# Patient Record
Sex: Female | Born: 1940 | Race: White | Hispanic: No | Marital: Married | Smoking: Former smoker
Health system: Southern US, Community
[De-identification: ages and names within clinical notes are randomized; demographics above are authoritative.]

## PROBLEM LIST (undated history)

## (undated) ENCOUNTER — Emergency Department: Payer: Medicare HMO

## (undated) ENCOUNTER — Ambulatory Visit: Admission: EM | Payer: PPO | Source: Home / Self Care

## (undated) ENCOUNTER — Emergency Department (HOSPITAL_COMMUNITY): Payer: PPO

## (undated) DIAGNOSIS — N189 Chronic kidney disease, unspecified: Secondary | ICD-10-CM

## (undated) DIAGNOSIS — F0150 Vascular dementia without behavioral disturbance: Secondary | ICD-10-CM

## (undated) HISTORY — PX: TONSILLECTOMY: SUR1361

## (undated) HISTORY — PX: CHOLECYSTECTOMY: SHX55

## (undated) HISTORY — PX: LUMBAR PERITONEAL SHUNT: SHX1984

## (undated) HISTORY — PX: OTHER SURGICAL HISTORY: SHX169

## (undated) HISTORY — PX: KIDNEY TRANSPLANT: SHX239

---

## 1999-01-18 ENCOUNTER — Other Ambulatory Visit: Admission: RE | Admit: 1999-01-18 | Discharge: 1999-01-18 | Payer: Self-pay | Admitting: *Deleted

## 2000-02-02 ENCOUNTER — Other Ambulatory Visit: Admission: RE | Admit: 2000-02-02 | Discharge: 2000-02-02 | Payer: Self-pay | Admitting: *Deleted

## 2001-03-01 ENCOUNTER — Other Ambulatory Visit: Admission: RE | Admit: 2001-03-01 | Discharge: 2001-03-01 | Payer: Self-pay | Admitting: *Deleted

## 2001-03-22 ENCOUNTER — Ambulatory Visit (HOSPITAL_COMMUNITY): Admission: RE | Admit: 2001-03-22 | Discharge: 2001-03-22 | Payer: Self-pay | Admitting: Internal Medicine

## 2002-03-04 ENCOUNTER — Other Ambulatory Visit: Admission: RE | Admit: 2002-03-04 | Discharge: 2002-03-04 | Payer: Self-pay | Admitting: Obstetrics & Gynecology

## 2003-03-06 ENCOUNTER — Other Ambulatory Visit: Admission: RE | Admit: 2003-03-06 | Discharge: 2003-03-06 | Payer: Self-pay | Admitting: *Deleted

## 2004-03-10 ENCOUNTER — Other Ambulatory Visit: Admission: RE | Admit: 2004-03-10 | Discharge: 2004-03-10 | Payer: Self-pay | Admitting: *Deleted

## 2007-09-03 ENCOUNTER — Ambulatory Visit: Payer: Self-pay | Admitting: Internal Medicine

## 2007-09-03 ENCOUNTER — Encounter: Payer: Self-pay | Admitting: Internal Medicine

## 2007-09-03 ENCOUNTER — Ambulatory Visit (HOSPITAL_COMMUNITY): Admission: RE | Admit: 2007-09-03 | Discharge: 2007-09-03 | Payer: Self-pay | Admitting: Internal Medicine

## 2007-11-06 ENCOUNTER — Ambulatory Visit (HOSPITAL_COMMUNITY): Admission: RE | Admit: 2007-11-06 | Discharge: 2007-11-06 | Payer: Self-pay | Admitting: Internal Medicine

## 2009-02-02 ENCOUNTER — Ambulatory Visit (HOSPITAL_COMMUNITY): Admission: RE | Admit: 2009-02-02 | Discharge: 2009-02-02 | Payer: Self-pay | Admitting: Internal Medicine

## 2009-08-17 ENCOUNTER — Encounter (HOSPITAL_COMMUNITY): Admission: RE | Admit: 2009-08-17 | Discharge: 2009-09-16 | Payer: Self-pay | Admitting: Internal Medicine

## 2009-12-23 ENCOUNTER — Encounter: Payer: Self-pay | Admitting: Internal Medicine

## 2009-12-27 ENCOUNTER — Ambulatory Visit: Payer: Self-pay | Admitting: Internal Medicine

## 2009-12-27 DIAGNOSIS — R197 Diarrhea, unspecified: Secondary | ICD-10-CM | POA: Insufficient documentation

## 2009-12-27 DIAGNOSIS — Z940 Kidney transplant status: Secondary | ICD-10-CM | POA: Insufficient documentation

## 2009-12-28 ENCOUNTER — Ambulatory Visit: Payer: Self-pay | Admitting: Internal Medicine

## 2009-12-28 ENCOUNTER — Ambulatory Visit (HOSPITAL_COMMUNITY): Admission: RE | Admit: 2009-12-28 | Discharge: 2009-12-28 | Payer: Self-pay | Admitting: Internal Medicine

## 2010-01-19 ENCOUNTER — Telehealth (INDEPENDENT_AMBULATORY_CARE_PROVIDER_SITE_OTHER): Payer: Self-pay

## 2010-01-31 ENCOUNTER — Encounter: Payer: Self-pay | Admitting: Internal Medicine

## 2010-02-03 ENCOUNTER — Encounter: Payer: Self-pay | Admitting: Internal Medicine

## 2010-03-15 ENCOUNTER — Encounter (HOSPITAL_COMMUNITY): Admission: RE | Admit: 2010-03-15 | Discharge: 2010-04-20 | Payer: Self-pay | Admitting: Orthopedic Surgery

## 2010-04-21 ENCOUNTER — Encounter (HOSPITAL_COMMUNITY): Admission: RE | Admit: 2010-04-21 | Discharge: 2010-05-21 | Payer: Self-pay | Admitting: Orthopedic Surgery

## 2010-05-23 ENCOUNTER — Encounter (HOSPITAL_COMMUNITY): Admission: RE | Admit: 2010-05-23 | Discharge: 2010-06-22 | Payer: Self-pay | Admitting: Orthopedic Surgery

## 2010-06-24 ENCOUNTER — Encounter (HOSPITAL_COMMUNITY): Admission: RE | Admit: 2010-06-24 | Discharge: 2010-07-24 | Payer: Self-pay | Admitting: Orthopedic Surgery

## 2010-11-24 NOTE — Letter (Signed)
Summary: RELEASE OF RECORDS  RELEASE OF RECORDS   Imported By: Diana Eves 01/31/2010 11:30:24  _____________________________________________________________________  External Attachment:    Type:   Image     Comment:   External Document

## 2010-11-24 NOTE — Letter (Addendum)
Summary: FLEX SIG ORDER  FLEX SIG ORDER   Imported By: Ave Filter 12/27/2009 11:38:28  _____________________________________________________________________  External Attachments:     1. Type:   Image          Comment:   External Document    2. Type:   Image          Comment:   External Document

## 2010-11-24 NOTE — Letter (Signed)
Summary: [facility] OFFICE NOTE  [facility] OFF[facility] NOTE   Imported By: Ave Filter 02/03/2010 12:09:43  _____________________________________________________________________  External Attachment:    Type:   Image     Comment:   External Document

## 2010-11-24 NOTE — Assessment & Plan Note (Signed)
Summary: diarrhea/MM   Visit Type:  Consult Primary Care Provider:  Carylon [name]  Chief Complaint:  diarrhea x 3 weeks and no blood.  History of Present Illness: Kristine [name] is a pleasant 70 y/o WF, patient of Dr. Carylon [name], who presents for further evaluation of diarrhea of several weeks duration. On Feb 14th, she developed N/V/D. Her husband had similar symptoms within one week as well. Vomiting lasted for one day, but she has had diarrhea now for three weeks. Denies recent travel abroad, anitibiotic use. Her appetite has been poor. She has had negative stool culture, C. Diff, O+P. She is eating bread, bananas, crackers, boiled chicken and potatoes. She has lost 6-7 pounds. Coffee causes diarrhea. She is having diarrhea every 12 hours. She has a BM which last for 15-30 minutes when it occurs. Denies abd pain, melena, brbpr. 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 Since diarrhea started, her Prograff blood levels have been too high and her dosage has been adjusted. She say her H/H, Creatinine have been normal. Her last labs were on Friday at [facility]. She is taking Lomotil up to four times per day. She c/o weakness. She usually walks four miles daily, but has not walked in three weeks. C/O lot of gas. Over the weekend she had her first formed stool in three weeks, but it was followed shortly after by watery stool. Stools are yellow.            Current Medications (verified): 1)  Prograf 1 Mg Caps (Tacrolimus) .... Two Times A Day 2)  Myfortic 180 Mg Tbec (Mycophenolate Sodium) .... 2 Two Times A Day 3)  Diphenatol 2.5-0.025 Mg Tabs (Diphenoxylate-Atropine) .... As Needed 4)  Mag-Ox 400 400 Mg Tabs (Magnesium Oxide) .... Once Daily 5)  Aspir-Low 81 Mg Tbec (Aspirin) .... Once Daily 6)  Omeprazole 20 Mg Cpdr (Omeprazole) .... Once Daily 7)  Simvastatin 40 Mg Tabs (Simvastatin) .... Once Daily 8)  Ferrous Sulfate 325 (65 Fe) Mg Tabs (Ferrous Sulfate) .... As Needed 9)  Calcium + Vitamin D .... 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 Surveillance TCS recommended for 08/2012.  Past Surgical History: Cholecystectomy Tonsillectomy Renal transplant, Kidney placed in LLQ.  Family History: Mother, amyloidosis, deceased age 57 Father, deceased age 55, CHF, CABG, CVA No FH CRC.  Social History: Married. Two children. Retired. Quit tob 1977. No alcohol use.   Review of Systems General:  Complains of weakness and weight loss; denies fever, chills, anorexia, and fatigue. Eyes:  Denies vision loss. ENT:  Denies nasal congestion. CV:  Denies chest pains, dyspnea on exertion, and peripheral edema. Resp:  Denies dyspnea at rest, dyspnea with exercise, and cough. GI:  See HPI; Denies difficulty swallowing, pain on swallowing, and indigestion/heartburn. GU:  Denies urinary burning and blood in urine. MS:  Denies joint pain / LOM. Derm:  Denies rash and itching. Neuro:  Complains of weakness; denies paralysis, frequent headaches, memory loss, and confusion. Psych:  Denies depression and anxiety. Endo:  Complains of unusual weight change. Heme:  Denies bruising and bleeding. Allergy:  Denies hives and rash.  Vital Signs:  Patient profile:   70 year old female Height:      62 inches Weight:      122 pounds BMI:     22.39 Temp:     97.2 degrees  F oral Pulse rate:   64 / minute BP sitting:   100 / 68  (left arm) Cuff size:   regular  Vitals Entered By: Hendricks Limes LPN (December 28, 979 10:39 AM)  Physical Exam  General:  Well developed, well nourished, no acute distress. Head:  Normocephalic and atraumatic. Eyes:  Conjunctivae pink, no scleral icterus.  Mouth:  Oropharyngeal mucosa moist, pink.  No lesions, erythema or exudate.    Neck:  Supple; no masses  or thyromegaly. Lungs:  Clear throughout to auscultation. Heart:  Regular rate and rhythm; no murmurs, rubs,  or bruits. Abdomen:  Scar in LLQ. Bowel sounds normal.  Abdomen is soft, nontender, nondistended.  No rebound or guarding.  No hepatosplenomegaly, masses or hernias.  No abdominal bruits.  Rectal:  deferred until time of colonoscopy.   Extremities:  No clubbing, cyanosis, edema or deformities noted. Neurologic:  Alert and  oriented x4;  grossly normal neurologically. Skin:  Intact without significant lesions or rashes. Cervical Nodes:  No significant cervical adenopathy. Psych:  Alert and cooperative. Normal mood and affect.  Impression & Recommendations:  Problem # 1:  DIARRHEA (ICD-787.91)  Three week h/o diarrhea, initially associated with N/V. Husband with similar symptoms that lasted for three days. Patient has not noted any signficant improvement of symptoms. Stool studies negative. She had difficulty keeping up with fluids and Prograff levels too high and since adjusted. DDx: protracted viral syndrome, post-infectious IBS, microscopic colitis, medication related. Discussed options of waiting a few more days to see if continued improvement, given recent semisolid stool vs Flex sig +/- biopsies. She wants to pursue FS given h/o renal transplant and immunocompromised state. Flex sig to be performed in near future.  Risks, alternatives, and benefits including but not limited to the risk of reaction to medication, bleeding, infection, and perforation were addressed.  Patient voiced understanding and provided verbal consent. She will use tap water enemas instead of Fleet enemas for bowel prep.  Orders: Est. Patient Level IV (19147)

## 2010-11-24 NOTE — Progress Notes (Signed)
Summary: phone note/ yellow stools/but diarrhea better  Phone Note Call from Patient   Caller: Patient Summary of Call: VM from pt. Said she started the probiotic day of her procedure. She is no longer having diarrhea. Mainly concerned because stools are always yellow, although not as bright yellow as they use to be. Wants to know why that is.  Also, wants to know if she should continue her probiotic. Please advise. Initial call taken by: Cloria Spring LPN,  January 19, 2010 9:27 AM     Appended Document: phone note/ yellow stools/but diarrhea better   I'm not sure either. It may be something in her medication or diet. It doesn't sound like a problem. Would continue probiotic for another 2 months and then she could taper off and see how she does. I'm glad in her diarrhea has gotten better.  Appended Document: phone note/ yellow stools/but diarrhea better Pt informed.

## 2010-11-24 NOTE — Letter (Signed)
Summary: LAB RESULTS  LAB RESULTS   Imported By: Ricard Dillon 12/23/2009 08:49:41  _____________________________________________________________________  External Attachment:    Type:   Image     Comment:   External Document

## 2011-03-07 NOTE — Procedures (Signed)
NAMEEMMALYNNE, COURTNEY                 ACCOUNT NO.:  [id number]   MEDICAL RECORD NO.:  [id number]           PATIENT TYPE:  OUT   LOCATION:  RAD                           FACILITY:  [facility]   PHYSICIAN:  Kingsley Callander. Ouida Sills, MD       DATE OF BIRTH:  [birth year]   DATE OF PROCEDURE:  DATE OF DISCHARGE:                                  STRESS TEST   The patient underwent an exercise stress test for evaluation of possible  future renal transplantation. She exercised 10 minutes 36 seconds (1  minute 36 seconds at stage IV of Bruce protocol) obtaining a maximal  heart rate 124 (81% of age predicted maximal heart rate) at a workload  of 12.8 mets and discontinued exercise due to leg fatigue.  There are no  symptoms of chest pain.  There were no arrhythmias.  There were no ST-  segment changes diagnostic of ischemia.  She developed a drop in blood  pressure during early recovery to the 82/50 range.  This subsequently  normalized to 108/58.  The baseline electrocardiogram revealed normal  sinus rhythm at 71 beats per minute.   IMPRESSION:  Nondiagnostic exercise stress test with no evidence of  exercise-induced ischemia.  Test is nondiagnostic due to failure to  reach 85% of her target heart rate.      Kingsley Callander. Ouida Sills, MD  Electronically Signed     ROF/MEDQ  D:  11/06/2007  T:  11/06/2007  Job:  [id number]

## 2011-03-07 NOTE — Op Note (Signed)
[name], [name]                 ACCOUNT NO.:  [id number]   MEDICAL RECORD NO.:  [id number]          PATIENT TYPE:  AMB   LOCATION:  DAY                           FACILITY:  [facility]   PHYSICIAN:  Kristine [name], M.D. DATE OF BIRTH:  [birth year]   DATE OF PROCEDURE:  09/03/2007  DATE OF DISCHARGE:                               OPERATIVE REPORT   COLONOSCOPY, SNARE POLYPECTOMY:   INDICATIONS FOR PROCEDURE:  A 70 year old lady in preparation for  listing for renal transplant, referred by Dr. Ouida [name] for colorectal  cancer screening.  She had a colonoscopy back in 2002 without  significant findings.  She has no lower GI tract symptoms currently.  There is no family history of colorectal neoplasia.  Colonoscopy is now  being done as per transplant protocol.  This approach has been discussed  with the patient at length.  Potential risks, benefits and alternatives  have been reviewed, questions answered.  She is agreeable.  Please see  documentation in the medical record.   PROCEDURE NOTE:  O2 saturation, blood pressure, pulse and respirations  were monitored throughout the entire procedure.  Conscious sedation:  Versed 5 mg IV, Demerol mg IV in divided doses.  Instrument:  Pentax  video chip system.   FINDINGS:  Digital rectal exam revealed no abnormalities.  Endoscopic  findings:  The prep was good.   Colon:  The colonic mucosa was surveyed from the rectosigmoid junction  through the left, transverse, right colon to area of the appendical  orifice, ileocecal valve and cecum.  These structures were well-seen and  photographed for the record.  From this level the scope was slowly and  cautiously withdrawn and all previously-mentioned mucosal surfaces were  again seen.  The patient had a 4-5-mm polyp on a stalk in the ascending  colon, which was completely removed with one pass of the cold snare and  recovered.  The patient also had scattered left-sided diverticula.  The  remainder of the  colonic mucosa appeared normal.  The scope was pulled  down to the rectum and a thorough examination of the rectal mucosa  including retroflex view of the anal verge demonstrated no  abnormalities.  The patient tolerated the procedure well, was reacted in  endoscopy.   IMPRESSION:  1. Normal rectum.  2. Left-sided diverticula.  3. Ascending colon polyp, status post snare polypectomy as described      above.  4. The remainder of the colonic mucosa appeared normal.   RECOMMENDATIONS:  1. Diverticulosis literature provided to Ms. Hensen.  2. Follow up on pathology.  3. Further recommendations to follow.      Kristine [name], M.D.  Electronically Signed     RMR/MEDQ  D:  09/03/2007  T:  09/03/2007  Job:  [id number]   cc:   Kristine [name], M.D.  Fax: [phone]   Kristine [name]. Kristine Sills, MD  Fax: [phone]

## 2011-12-04 DIAGNOSIS — J329 Chronic sinusitis, unspecified: Secondary | ICD-10-CM | POA: Diagnosis not present

## 2011-12-06 DIAGNOSIS — Z79899 Other long term (current) drug therapy: Secondary | ICD-10-CM | POA: Diagnosis not present

## 2011-12-06 DIAGNOSIS — Z940 Kidney transplant status: Secondary | ICD-10-CM | POA: Diagnosis not present

## 2011-12-11 DIAGNOSIS — Z1231 Encounter for screening mammogram for malignant neoplasm of breast: Secondary | ICD-10-CM | POA: Diagnosis not present

## 2011-12-12 DIAGNOSIS — Z79899 Other long term (current) drug therapy: Secondary | ICD-10-CM | POA: Diagnosis not present

## 2011-12-25 DIAGNOSIS — Z79899 Other long term (current) drug therapy: Secondary | ICD-10-CM | POA: Diagnosis not present

## 2012-01-01 ENCOUNTER — Other Ambulatory Visit: Payer: Self-pay | Admitting: Dermatology

## 2012-01-01 DIAGNOSIS — D239 Other benign neoplasm of skin, unspecified: Secondary | ICD-10-CM | POA: Diagnosis not present

## 2012-01-01 DIAGNOSIS — L821 Other seborrheic keratosis: Secondary | ICD-10-CM | POA: Diagnosis not present

## 2012-01-01 DIAGNOSIS — Z85828 Personal history of other malignant neoplasm of skin: Secondary | ICD-10-CM | POA: Diagnosis not present

## 2012-01-01 DIAGNOSIS — D485 Neoplasm of uncertain behavior of skin: Secondary | ICD-10-CM | POA: Diagnosis not present

## 2012-01-01 DIAGNOSIS — C4401 Basal cell carcinoma of skin of lip: Secondary | ICD-10-CM | POA: Diagnosis not present

## 2012-01-03 DIAGNOSIS — Z940 Kidney transplant status: Secondary | ICD-10-CM | POA: Diagnosis not present

## 2012-01-23 DIAGNOSIS — N2581 Secondary hyperparathyroidism of renal origin: Secondary | ICD-10-CM | POA: Diagnosis not present

## 2012-01-23 DIAGNOSIS — E785 Hyperlipidemia, unspecified: Secondary | ICD-10-CM | POA: Diagnosis not present

## 2012-01-23 DIAGNOSIS — Z79899 Other long term (current) drug therapy: Secondary | ICD-10-CM | POA: Diagnosis not present

## 2012-01-23 DIAGNOSIS — Z940 Kidney transplant status: Secondary | ICD-10-CM | POA: Diagnosis not present

## 2012-01-30 DIAGNOSIS — N183 Chronic kidney disease, stage 3 unspecified: Secondary | ICD-10-CM | POA: Diagnosis not present

## 2012-01-30 DIAGNOSIS — Z79899 Other long term (current) drug therapy: Secondary | ICD-10-CM | POA: Diagnosis not present

## 2012-01-30 DIAGNOSIS — H2589 Other age-related cataract: Secondary | ICD-10-CM | POA: Diagnosis not present

## 2012-01-30 DIAGNOSIS — Z940 Kidney transplant status: Secondary | ICD-10-CM | POA: Diagnosis not present

## 2012-03-12 DIAGNOSIS — C4401 Basal cell carcinoma of skin of lip: Secondary | ICD-10-CM | POA: Diagnosis not present

## 2012-03-25 DIAGNOSIS — Z79899 Other long term (current) drug therapy: Secondary | ICD-10-CM | POA: Diagnosis not present

## 2012-03-25 DIAGNOSIS — Z940 Kidney transplant status: Secondary | ICD-10-CM | POA: Diagnosis not present

## 2012-04-11 DIAGNOSIS — R05 Cough: Secondary | ICD-10-CM | POA: Diagnosis not present

## 2012-05-21 DIAGNOSIS — Z01419 Encounter for gynecological examination (general) (routine) without abnormal findings: Secondary | ICD-10-CM | POA: Diagnosis not present

## 2012-05-21 DIAGNOSIS — D849 Immunodeficiency, unspecified: Secondary | ICD-10-CM | POA: Diagnosis not present

## 2012-05-21 DIAGNOSIS — Z124 Encounter for screening for malignant neoplasm of cervix: Secondary | ICD-10-CM | POA: Diagnosis not present

## 2012-05-28 DIAGNOSIS — Z79899 Other long term (current) drug therapy: Secondary | ICD-10-CM | POA: Diagnosis not present

## 2012-05-28 DIAGNOSIS — Z940 Kidney transplant status: Secondary | ICD-10-CM | POA: Diagnosis not present

## 2012-06-25 DIAGNOSIS — E785 Hyperlipidemia, unspecified: Secondary | ICD-10-CM | POA: Diagnosis not present

## 2012-06-25 DIAGNOSIS — Z7982 Long term (current) use of aspirin: Secondary | ICD-10-CM | POA: Diagnosis not present

## 2012-06-25 DIAGNOSIS — Z48298 Encounter for aftercare following other organ transplant: Secondary | ICD-10-CM | POA: Diagnosis not present

## 2012-06-25 DIAGNOSIS — Z79899 Other long term (current) drug therapy: Secondary | ICD-10-CM | POA: Diagnosis not present

## 2012-06-25 DIAGNOSIS — N269 Renal sclerosis, unspecified: Secondary | ICD-10-CM | POA: Diagnosis not present

## 2012-06-25 DIAGNOSIS — I10 Essential (primary) hypertension: Secondary | ICD-10-CM | POA: Diagnosis not present

## 2012-06-25 DIAGNOSIS — Z940 Kidney transplant status: Secondary | ICD-10-CM | POA: Diagnosis not present

## 2012-06-25 DIAGNOSIS — N281 Cyst of kidney, acquired: Secondary | ICD-10-CM | POA: Diagnosis not present

## 2012-07-10 DIAGNOSIS — Z23 Encounter for immunization: Secondary | ICD-10-CM | POA: Diagnosis not present

## 2012-07-13 ENCOUNTER — Encounter (HOSPITAL_COMMUNITY): Payer: Self-pay | Admitting: *Deleted

## 2012-07-13 ENCOUNTER — Emergency Department (HOSPITAL_COMMUNITY): Payer: Medicare Other

## 2012-07-13 ENCOUNTER — Emergency Department (HOSPITAL_COMMUNITY)
Admission: EM | Admit: 2012-07-13 | Discharge: 2012-07-13 | Disposition: A | Discharge status: Home / Self Care | Payer: Medicare Other | Attending: Emergency Medicine | Admitting: Emergency Medicine

## 2012-07-13 DIAGNOSIS — Z7982 Long term (current) use of aspirin: Secondary | ICD-10-CM | POA: Insufficient documentation

## 2012-07-13 DIAGNOSIS — Z23 Encounter for immunization: Secondary | ICD-10-CM | POA: Insufficient documentation

## 2012-07-13 DIAGNOSIS — Z940 Kidney transplant status: Secondary | ICD-10-CM | POA: Diagnosis not present

## 2012-07-13 DIAGNOSIS — Z043 Encounter for examination and observation following other accident: Secondary | ICD-10-CM | POA: Diagnosis not present

## 2012-07-13 DIAGNOSIS — Z87891 Personal history of nicotine dependence: Secondary | ICD-10-CM | POA: Insufficient documentation

## 2012-07-13 DIAGNOSIS — S0003XA Contusion of scalp, initial encounter: Secondary | ICD-10-CM | POA: Insufficient documentation

## 2012-07-13 DIAGNOSIS — S0190XA Unspecified open wound of unspecified part of head, initial encounter: Secondary | ICD-10-CM | POA: Diagnosis not present

## 2012-07-13 DIAGNOSIS — S0990XA Unspecified injury of head, initial encounter: Secondary | ICD-10-CM | POA: Diagnosis not present

## 2012-07-13 MED ORDER — TETANUS-DIPHTH-ACELL PERTUSSIS 5-2.5-18.5 LF-MCG/0.5 IM SUSP
0.5000 mL | Freq: Once | INTRAMUSCULAR | Status: AC
  Administered 2012-07-13: 0.5 mL via INTRAMUSCULAR
  Filled 2012-07-13: qty 0.5

## 2012-07-13 NOTE — ED Notes (Signed)
Patient transported to CT 
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
## 2012-07-22 ENCOUNTER — Telehealth (INDEPENDENT_AMBULATORY_CARE_PROVIDER_SITE_OTHER): Payer: Self-pay | Admitting: *Deleted

## 2012-07-22 ENCOUNTER — Encounter (INDEPENDENT_AMBULATORY_CARE_PROVIDER_SITE_OTHER): Payer: Self-pay | Admitting: Internal Medicine

## 2012-07-22 ENCOUNTER — Other Ambulatory Visit (INDEPENDENT_AMBULATORY_CARE_PROVIDER_SITE_OTHER): Payer: Self-pay | Admitting: *Deleted

## 2012-07-22 ENCOUNTER — Ambulatory Visit (INDEPENDENT_AMBULATORY_CARE_PROVIDER_SITE_OTHER): Payer: Medicare Other | Admitting: Internal Medicine

## 2012-07-22 VITALS — BP 110/62 | HR 72 | Temp 98.1°F | Ht 62.0 in | Wt 131.3 lb

## 2012-07-22 DIAGNOSIS — Z1211 Encounter for screening for malignant neoplasm of colon: Secondary | ICD-10-CM

## 2012-07-22 DIAGNOSIS — D369 Benign neoplasm, unspecified site: Secondary | ICD-10-CM | POA: Diagnosis not present

## 2012-07-22 DIAGNOSIS — Z86010 Personal history of colonic polyps: Secondary | ICD-10-CM

## 2012-07-22 MED ORDER — PEG-KCL-NACL-NASULF-NA ASC-C 100 G PO SOLR: 1.0000 | Freq: Once | ORAL | Status: DC

## 2012-07-22 NOTE — Telephone Encounter (Signed)
Patient needs movi prep 

## 2012-07-22 NOTE — Patient Instructions (Addendum)
Colonoscopy.  The risks and benefits such as perforation, bleeding, and infection were reviewed with the patient and is agreeable. 

## 2012-07-22 NOTE — Progress Notes (Signed)
Subjective:     Patient ID: Kristine [name], female   DOB: [birth year], 71 y.o.   MRN: [id number]  HPI Here today to schedule a colonoscopy.  Her appetite is good. No weight loss. Denies any abdominal pain. She has a BM x 1 a day. No melena or bright red rectal bleeding. Occasonally has acid reflux.  Patient requested transfer from [facility].  08/2007 Colonoscopy 1. Normal rectum.  2. Left-sided diverticula.  3. Ascending colon polyp, status post snare polypectomy as described  above.  4. The remainder of the colonic mucosa appeared normal. new patient.    :Biopsy: MICROSCOPIC EXAMINATION AND DIAGNOSI  COLON, ASCENDING, POLYP, BIOPSY: TUBULAR ADENOMA. NO HIGH GRADE DYSPLASIA OR MALIGNANCY IDENTIFIED.     . 12/28/2009 Sigmoidoscopy: Unremarkable sigmoidoscopy to 30cm status post segmental biopsy. For several week history of nonbloody diarrhea. Stool studies were negative. Review of Systems See hpi  Current Outpatient Prescriptions  Medication Sig Dispense Refill  . aspirin EC 81 MG tablet Take 81 mg by mouth daily.      . Cholecalciferol (VITAMIN D) 2000 UNITS CAPS Take 1 capsule by mouth daily.      . mycophenolate (MYFORTIC) 180 MG EC tablet Take 540 mg by mouth 2 (two) times daily.      . simvastatin (ZOCOR) 40 MG tablet Take 40 mg by mouth every evening.      . tacrolimus (PROGRAF) 1 MG capsule Take 1 mg by mouth 2 (two) times daily.       Current Outpatient Prescriptions on File Prior to Visit  Medication Sig Dispense Refill  . aspirin EC 81 MG tablet Take 81 mg by mouth daily.      . Cholecalciferol (VITAMIN D) 2000 UNITS CAPS Take 1 capsule by mouth daily.      . mycophenolate (MYFORTIC) 180 MG EC tablet Take 540 mg by mouth 2 (two) times daily.      . simvastatin (ZOCOR) 40 MG tablet Take 40 mg by mouth every evening.      . tacrolimus (PROGRAF) 1 MG capsule Take 1 mg by mouth 2 (two) times daily.       History reviewed. No pertinent past medical history. Past Surgical History    Procedure Date  . Kidney transplant    History   Social History  . Marital Status: Married    Spouse Name: N/A    Number of Children: N/A  . Years of Education: N/A   Occupational History  . Not on file.   Social History Main Topics  . Smoking status: Former Games developer  . Smokeless tobacco: Not on file   Comment: quit in 1977  . Alcohol Use: No  . Drug Use: No  . Sexually Active: Not on file   Other Topics Concern  . Not on file   Social History Narrative  . No narrative on file   Family Status  Relation Status Death Age  . Mother Deceased     Amyloidosis  . Father Deceased     Resp. failure  . Brother Deceased     Brain cancer   No Known Allergies     Objective:   Physical Exam Filed Vitals:   07/22/12 1011  BP: 110/62  Pulse: 72  Temp: 98.1 F (36.7 C)  Height: 5\' 2"  (1.575 m)  Weight: 131 lb 4.8 oz (59.557 kg)   Alert and oriented. Skin warm and dry. Oral mucosa is moist.   . Sclera anicteric, conjunctivae is pink. Thyroid not enlarged. No  cervical lymphadenopathy. Lungs clear. Heart regular rate and rhythm.  Abdomen is soft. Bowel sounds are positive. No hepatomegaly. No abdominal masses felt. No tenderness.  No edema to lower extremities.       Assessment:    Hx of tubular adenoma. Last colonoscopy in 2008 with polypectomy/Dr. Jena Gauss. Due for surveillance. No GI problems.    Plan:     Colonoscopy.The risks and benefits such as perforation, bleeding, and infection were reviewed with the patient and is agreeable.

## 2012-08-13 ENCOUNTER — Other Ambulatory Visit (INDEPENDENT_AMBULATORY_CARE_PROVIDER_SITE_OTHER): Payer: Self-pay | Admitting: Internal Medicine

## 2012-08-14 DIAGNOSIS — Z940 Kidney transplant status: Secondary | ICD-10-CM | POA: Diagnosis not present

## 2012-08-14 DIAGNOSIS — N2581 Secondary hyperparathyroidism of renal origin: Secondary | ICD-10-CM | POA: Diagnosis not present

## 2012-08-14 DIAGNOSIS — Z79899 Other long term (current) drug therapy: Secondary | ICD-10-CM | POA: Diagnosis not present

## 2012-08-14 DIAGNOSIS — E785 Hyperlipidemia, unspecified: Secondary | ICD-10-CM | POA: Diagnosis not present

## 2012-08-21 ENCOUNTER — Encounter (HOSPITAL_COMMUNITY): Payer: Self-pay | Admitting: Pharmacy Technician

## 2012-08-28 ENCOUNTER — Encounter (HOSPITAL_COMMUNITY): Payer: Self-pay | Admitting: *Deleted

## 2012-08-28 ENCOUNTER — Ambulatory Visit (HOSPITAL_COMMUNITY)
Admission: RE | Admit: 2012-08-28 | Discharge: 2012-08-28 | Disposition: A | Discharge status: Home / Self Care | Payer: Medicare Other | Source: Ambulatory Visit | Attending: Internal Medicine | Admitting: Internal Medicine

## 2012-08-28 ENCOUNTER — Encounter (HOSPITAL_COMMUNITY)
Admission: RE | Disposition: A | Discharge status: Home / Self Care | Payer: Self-pay | Source: Ambulatory Visit | Attending: Internal Medicine

## 2012-08-28 DIAGNOSIS — K5730 Diverticulosis of large intestine without perforation or abscess without bleeding: Secondary | ICD-10-CM | POA: Insufficient documentation

## 2012-08-28 DIAGNOSIS — Z1211 Encounter for screening for malignant neoplasm of colon: Secondary | ICD-10-CM | POA: Insufficient documentation

## 2012-08-28 DIAGNOSIS — Z860100 Personal history of colon polyps, unspecified: Secondary | ICD-10-CM

## 2012-08-28 DIAGNOSIS — K5520 Angiodysplasia of colon without hemorrhage: Secondary | ICD-10-CM | POA: Diagnosis not present

## 2012-08-28 HISTORY — PX: COLONOSCOPY: SHX5424

## 2012-08-28 HISTORY — DX: Chronic kidney disease, unspecified: N18.9

## 2012-08-28 SURGERY — COLONOSCOPY
Anesthesia: Moderate Sedation

## 2012-08-28 MED ORDER — SODIUM CHLORIDE 0.45 % IV SOLN
INTRAVENOUS | Status: DC
  Administered 2012-08-28: 1000 mL via INTRAVENOUS

## 2012-08-28 MED ORDER — STERILE WATER FOR IRRIGATION IR SOLN
Status: DC | PRN
  Administered 2012-08-28: 09:00:00

## 2012-08-28 MED ORDER — MEPERIDINE HCL 50 MG/ML IJ SOLN
INTRAMUSCULAR | Status: DC | PRN
  Administered 2012-08-28 (×2): 25 mg via INTRAVENOUS

## 2012-08-28 MED ORDER — MIDAZOLAM HCL 5 MG/5ML IJ SOLN
INTRAMUSCULAR | Status: AC
  Filled 2012-08-28: qty 10

## 2012-08-28 MED ORDER — MEPERIDINE HCL 50 MG/ML IJ SOLN
INTRAMUSCULAR | Status: AC
  Filled 2012-08-28: qty 1

## 2012-08-28 MED ORDER — MIDAZOLAM HCL 5 MG/5ML IJ SOLN
INTRAMUSCULAR | Status: DC | PRN
  Administered 2012-08-28: 1 mg via INTRAVENOUS
  Administered 2012-08-28 (×2): 2 mg via INTRAVENOUS
  Administered 2012-08-28: 1 mg via INTRAVENOUS

## 2012-08-28 NOTE — H&P (Addendum)
Kristine [name] is an 71 y.o. female.   Chief Complaint: Patient is here for colonoscopy. HPI: Patient is 71 year old Caucasian female with history of colonic adenoma who is here for surveillance colonoscopy. She denies abdominal pain change in her bowel habits or rectal bleeding. Family history is negative for colorectal carcinoma. She's been doing well since she had renal  transplant in December 2009.  Past Medical History  Diagnosis Date  . Chronic kidney disease     had kidney transplant    Past Surgical History  Procedure Date  . Kidney transplant   . Tonsillectomy   . Cholecystectomy   . Orif left wrist     History reviewed. No pertinent family history. Social History:  reports that she has quit smoking. She does not have any smokeless tobacco history on file. She reports that she does not drink alcohol or use illicit drugs.  Allergies: No Known Allergies  Medications Prior to Admission  Medication Sig Dispense Refill  . aspirin EC 81 MG tablet Take 81 mg by mouth daily.      . Cholecalciferol (VITAMIN D) 2000 UNITS CAPS Take 1 capsule by mouth daily.      Marland Kitchen MOVIPREP 100 G SOLR TAKE AS DIRECTED.  1 each  0  . mycophenolate (MYFORTIC) 180 MG EC tablet Take 540 mg by mouth 2 (two) times daily.      . simvastatin (ZOCOR) 40 MG tablet Take 40 mg by mouth every evening.      . tacrolimus (PROGRAF) 1 MG capsule Take 1 mg by mouth 2 (two) times daily.        No results found for this or any previous visit (from the past 48 hour(s)). No results found.  ROS  Blood pressure 115/83, pulse 63, temperature 97.6 F (36.4 C), temperature source Oral, resp. rate 15, height 5\' 2"  (1.575 m), weight 125 lb (56.7 kg), SpO2 97.00%. Physical Exam  Constitutional: She appears well-developed and well-nourished.  HENT:  Mouth/Throat: Oropharynx is clear and moist.  Eyes: Conjunctivae normal are normal. No scleral icterus.  Neck: No thyromegaly present.  Cardiovascular: Normal rate, regular  rhythm and normal heart sounds.   No murmur heard. Respiratory: Effort normal and breath sounds normal.  GI: Soft. She exhibits no distension and no mass. There is no tenderness.       Palpable kidney at left lower quadrant  Musculoskeletal: She exhibits no edema.  Lymphadenopathy:    She has no cervical adenopathy.  Neurological: She is alert.  Skin: Skin is warm and dry.     Assessment/Plan History of colonic adenoma. Surveillance colonoscopy.  Kristine [name] U 08/28/2012, 9:17 AM

## 2012-08-28 NOTE — Op Note (Signed)
COLONOSCOPY PROCEDURE REPORT  PATIENT:  Kristine [name]  MR#:  [id number] Birthdate:  Nov 25, 1940, 71 y.o., female Endoscopist:  Dr. Malissa Hippo, MD Referred By:  Dr. Carylon Perches, MD Procedure Date: 08/28/2012  Procedure:   Colonoscopy  Indications:  Patient is 71 year old Caucasian female who has history of colonic adenoma. Her last exam was 5 years ago. She is here for surveillance colonoscopy.  Informed Consent:  The procedure and risks were reviewed with the patient and informed consent was obtained.  Medications:  Demerol 50 mg IV Versed 6 mg IV  Description of procedure:  After a digital rectal exam was performed, that colonoscope was advanced from the anus through the rectum and colon to the area of the cecum, ileocecal valve and appendiceal orifice. The cecum was deeply intubated. These structures were well-seen and photographed for the record. From the level of the cecum and ileocecal valve, the scope was slowly and cautiously withdrawn. The mucosal surfaces were carefully surveyed utilizing scope tip to flexion to facilitate fold flattening as needed. The scope was pulled down into the rectum where a thorough exam including retroflexion was performed.  Findings:   Prep excellent. 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 Few scattered diverticula at sigmoid colon. No evidence of recurrent polyps. Normal rectal mucosa and anal rectal junction.  Therapeutic/Diagnostic Maneuvers Performed:  None  Complications:  None  Cecal Withdrawal Time:  11 minutes  Impression:  Small cecal AV malformation. Few diverticula at sigmoid colon. No evidence of recurrent polyps.  Recommendations:  Standard instructions given. Patient could wait 7 years before her next exam.  REHMAN,NAJEEB U  08/28/2012 9:55 AM  CC: Dr. Carylon Perches, MD & Dr. Bonnetta Barry ref. provider found

## 2012-09-02 ENCOUNTER — Encounter (HOSPITAL_COMMUNITY): Payer: Self-pay | Admitting: Internal Medicine

## 2012-09-30 DIAGNOSIS — Z940 Kidney transplant status: Secondary | ICD-10-CM | POA: Diagnosis not present

## 2012-09-30 DIAGNOSIS — Z79899 Other long term (current) drug therapy: Secondary | ICD-10-CM | POA: Diagnosis not present

## 2012-11-25 DIAGNOSIS — Z79899 Other long term (current) drug therapy: Secondary | ICD-10-CM | POA: Diagnosis not present

## 2012-11-25 DIAGNOSIS — Z940 Kidney transplant status: Secondary | ICD-10-CM | POA: Diagnosis not present

## 2012-11-29 DIAGNOSIS — Z79899 Other long term (current) drug therapy: Secondary | ICD-10-CM | POA: Diagnosis not present

## 2012-12-10 DIAGNOSIS — Z79899 Other long term (current) drug therapy: Secondary | ICD-10-CM | POA: Diagnosis not present

## 2012-12-10 DIAGNOSIS — Z940 Kidney transplant status: Secondary | ICD-10-CM | POA: Diagnosis not present

## 2012-12-11 DIAGNOSIS — Z1231 Encounter for screening mammogram for malignant neoplasm of breast: Secondary | ICD-10-CM | POA: Diagnosis not present

## 2013-01-06 ENCOUNTER — Other Ambulatory Visit: Payer: Self-pay | Admitting: Dermatology

## 2013-01-06 DIAGNOSIS — D485 Neoplasm of uncertain behavior of skin: Secondary | ICD-10-CM | POA: Diagnosis not present

## 2013-01-06 DIAGNOSIS — Z85828 Personal history of other malignant neoplasm of skin: Secondary | ICD-10-CM | POA: Diagnosis not present

## 2013-01-06 DIAGNOSIS — D0460 Carcinoma in situ of skin of unspecified upper limb, including shoulder: Secondary | ICD-10-CM | POA: Diagnosis not present

## 2013-01-06 DIAGNOSIS — L821 Other seborrheic keratosis: Secondary | ICD-10-CM | POA: Diagnosis not present

## 2013-01-06 DIAGNOSIS — D239 Other benign neoplasm of skin, unspecified: Secondary | ICD-10-CM | POA: Diagnosis not present

## 2013-01-17 DIAGNOSIS — H2510 Age-related nuclear cataract, unspecified eye: Secondary | ICD-10-CM | POA: Diagnosis not present

## 2013-01-17 DIAGNOSIS — H43819 Vitreous degeneration, unspecified eye: Secondary | ICD-10-CM | POA: Diagnosis not present

## 2013-01-17 DIAGNOSIS — H04129 Dry eye syndrome of unspecified lacrimal gland: Secondary | ICD-10-CM | POA: Diagnosis not present

## 2013-01-23 DIAGNOSIS — N2581 Secondary hyperparathyroidism of renal origin: Secondary | ICD-10-CM | POA: Diagnosis not present

## 2013-01-23 DIAGNOSIS — Z79899 Other long term (current) drug therapy: Secondary | ICD-10-CM | POA: Diagnosis not present

## 2013-01-23 DIAGNOSIS — E785 Hyperlipidemia, unspecified: Secondary | ICD-10-CM | POA: Diagnosis not present

## 2013-01-23 DIAGNOSIS — Z940 Kidney transplant status: Secondary | ICD-10-CM | POA: Diagnosis not present

## 2013-02-06 DIAGNOSIS — Z79899 Other long term (current) drug therapy: Secondary | ICD-10-CM | POA: Diagnosis not present

## 2013-02-06 DIAGNOSIS — Z940 Kidney transplant status: Secondary | ICD-10-CM | POA: Diagnosis not present

## 2013-02-06 DIAGNOSIS — D0460 Carcinoma in situ of skin of unspecified upper limb, including shoulder: Secondary | ICD-10-CM | POA: Diagnosis not present

## 2013-02-10 DIAGNOSIS — Z940 Kidney transplant status: Secondary | ICD-10-CM | POA: Diagnosis not present

## 2013-02-10 DIAGNOSIS — Z79899 Other long term (current) drug therapy: Secondary | ICD-10-CM | POA: Diagnosis not present

## 2013-02-20 DIAGNOSIS — Z79899 Other long term (current) drug therapy: Secondary | ICD-10-CM | POA: Diagnosis not present

## 2013-03-06 DIAGNOSIS — Z79899 Other long term (current) drug therapy: Secondary | ICD-10-CM | POA: Diagnosis not present

## 2013-03-25 DIAGNOSIS — Z940 Kidney transplant status: Secondary | ICD-10-CM | POA: Diagnosis not present

## 2013-03-25 DIAGNOSIS — Z79899 Other long term (current) drug therapy: Secondary | ICD-10-CM | POA: Diagnosis not present

## 2013-03-28 DIAGNOSIS — H25049 Posterior subcapsular polar age-related cataract, unspecified eye: Secondary | ICD-10-CM | POA: Diagnosis not present

## 2013-03-28 DIAGNOSIS — H2510 Age-related nuclear cataract, unspecified eye: Secondary | ICD-10-CM | POA: Diagnosis not present

## 2013-03-28 DIAGNOSIS — H52229 Regular astigmatism, unspecified eye: Secondary | ICD-10-CM | POA: Diagnosis not present

## 2013-03-28 DIAGNOSIS — H25019 Cortical age-related cataract, unspecified eye: Secondary | ICD-10-CM | POA: Diagnosis not present

## 2013-03-28 DIAGNOSIS — H18419 Arcus senilis, unspecified eye: Secondary | ICD-10-CM | POA: Diagnosis not present

## 2013-05-12 DIAGNOSIS — H2510 Age-related nuclear cataract, unspecified eye: Secondary | ICD-10-CM | POA: Diagnosis not present

## 2013-05-12 DIAGNOSIS — H269 Unspecified cataract: Secondary | ICD-10-CM | POA: Diagnosis not present

## 2013-05-13 DIAGNOSIS — H2510 Age-related nuclear cataract, unspecified eye: Secondary | ICD-10-CM | POA: Diagnosis not present

## 2013-05-14 DIAGNOSIS — Z940 Kidney transplant status: Secondary | ICD-10-CM | POA: Diagnosis not present

## 2013-05-14 DIAGNOSIS — Z79899 Other long term (current) drug therapy: Secondary | ICD-10-CM | POA: Diagnosis not present

## 2013-05-21 DIAGNOSIS — Z01419 Encounter for gynecological examination (general) (routine) without abnormal findings: Secondary | ICD-10-CM | POA: Diagnosis not present

## 2013-05-21 DIAGNOSIS — Z124 Encounter for screening for malignant neoplasm of cervix: Secondary | ICD-10-CM | POA: Diagnosis not present

## 2013-05-21 DIAGNOSIS — Z79899 Other long term (current) drug therapy: Secondary | ICD-10-CM | POA: Diagnosis not present

## 2013-05-21 DIAGNOSIS — Z940 Kidney transplant status: Secondary | ICD-10-CM | POA: Diagnosis not present

## 2013-05-22 DIAGNOSIS — Z940 Kidney transplant status: Secondary | ICD-10-CM | POA: Diagnosis not present

## 2013-05-22 DIAGNOSIS — Z79899 Other long term (current) drug therapy: Secondary | ICD-10-CM | POA: Diagnosis not present

## 2013-05-26 DIAGNOSIS — H269 Unspecified cataract: Secondary | ICD-10-CM | POA: Diagnosis not present

## 2013-05-26 DIAGNOSIS — H2510 Age-related nuclear cataract, unspecified eye: Secondary | ICD-10-CM | POA: Diagnosis not present

## 2013-06-25 DIAGNOSIS — Z79899 Other long term (current) drug therapy: Secondary | ICD-10-CM | POA: Diagnosis not present

## 2013-06-25 DIAGNOSIS — Z940 Kidney transplant status: Secondary | ICD-10-CM | POA: Diagnosis not present

## 2013-07-25 DIAGNOSIS — Z23 Encounter for immunization: Secondary | ICD-10-CM | POA: Diagnosis not present

## 2013-08-06 DIAGNOSIS — N269 Renal sclerosis, unspecified: Secondary | ICD-10-CM | POA: Diagnosis not present

## 2013-08-06 DIAGNOSIS — Z940 Kidney transplant status: Secondary | ICD-10-CM | POA: Diagnosis not present

## 2013-08-06 DIAGNOSIS — Z5181 Encounter for therapeutic drug level monitoring: Secondary | ICD-10-CM | POA: Diagnosis not present

## 2013-08-06 DIAGNOSIS — Z13820 Encounter for screening for osteoporosis: Secondary | ICD-10-CM | POA: Diagnosis not present

## 2013-08-06 DIAGNOSIS — I10 Essential (primary) hypertension: Secondary | ICD-10-CM | POA: Diagnosis not present

## 2013-08-06 DIAGNOSIS — M899 Disorder of bone, unspecified: Secondary | ICD-10-CM | POA: Diagnosis not present

## 2013-08-06 DIAGNOSIS — E782 Mixed hyperlipidemia: Secondary | ICD-10-CM | POA: Diagnosis not present

## 2013-08-06 DIAGNOSIS — N281 Cyst of kidney, acquired: Secondary | ICD-10-CM | POA: Diagnosis not present

## 2013-08-06 DIAGNOSIS — I120 Hypertensive chronic kidney disease with stage 5 chronic kidney disease or end stage renal disease: Secondary | ICD-10-CM | POA: Diagnosis not present

## 2013-08-06 DIAGNOSIS — Z780 Asymptomatic menopausal state: Secondary | ICD-10-CM | POA: Diagnosis not present

## 2013-08-06 DIAGNOSIS — Z79899 Other long term (current) drug therapy: Secondary | ICD-10-CM | POA: Diagnosis not present

## 2013-08-06 DIAGNOSIS — Z48298 Encounter for aftercare following other organ transplant: Secondary | ICD-10-CM | POA: Diagnosis not present

## 2013-08-06 DIAGNOSIS — E785 Hyperlipidemia, unspecified: Secondary | ICD-10-CM | POA: Diagnosis not present

## 2013-08-06 DIAGNOSIS — N186 End stage renal disease: Secondary | ICD-10-CM | POA: Diagnosis not present

## 2013-09-16 DIAGNOSIS — Z79899 Other long term (current) drug therapy: Secondary | ICD-10-CM | POA: Diagnosis not present

## 2013-09-16 DIAGNOSIS — Z940 Kidney transplant status: Secondary | ICD-10-CM | POA: Diagnosis not present

## 2013-11-03 DIAGNOSIS — Z79899 Other long term (current) drug therapy: Secondary | ICD-10-CM | POA: Diagnosis not present

## 2013-11-03 DIAGNOSIS — Z940 Kidney transplant status: Secondary | ICD-10-CM | POA: Diagnosis not present

## 2013-11-07 DIAGNOSIS — Z79899 Other long term (current) drug therapy: Secondary | ICD-10-CM | POA: Diagnosis not present

## 2013-11-10 DIAGNOSIS — Z79899 Other long term (current) drug therapy: Secondary | ICD-10-CM | POA: Diagnosis not present

## 2013-11-10 DIAGNOSIS — Z940 Kidney transplant status: Secondary | ICD-10-CM | POA: Diagnosis not present

## 2013-11-17 DIAGNOSIS — Z940 Kidney transplant status: Secondary | ICD-10-CM | POA: Diagnosis not present

## 2013-11-28 DIAGNOSIS — Z79899 Other long term (current) drug therapy: Secondary | ICD-10-CM | POA: Diagnosis not present

## 2013-12-15 DIAGNOSIS — Z1231 Encounter for screening mammogram for malignant neoplasm of breast: Secondary | ICD-10-CM | POA: Diagnosis not present

## 2013-12-22 DIAGNOSIS — Z79899 Other long term (current) drug therapy: Secondary | ICD-10-CM | POA: Diagnosis not present

## 2013-12-22 DIAGNOSIS — Z940 Kidney transplant status: Secondary | ICD-10-CM | POA: Diagnosis not present

## 2014-01-13 DIAGNOSIS — D1801 Hemangioma of skin and subcutaneous tissue: Secondary | ICD-10-CM | POA: Diagnosis not present

## 2014-01-13 DIAGNOSIS — L821 Other seborrheic keratosis: Secondary | ICD-10-CM | POA: Diagnosis not present

## 2014-01-13 DIAGNOSIS — L819 Disorder of pigmentation, unspecified: Secondary | ICD-10-CM | POA: Diagnosis not present

## 2014-01-13 DIAGNOSIS — Z85828 Personal history of other malignant neoplasm of skin: Secondary | ICD-10-CM | POA: Diagnosis not present

## 2014-01-13 DIAGNOSIS — D239 Other benign neoplasm of skin, unspecified: Secondary | ICD-10-CM | POA: Diagnosis not present

## 2014-01-13 DIAGNOSIS — D485 Neoplasm of uncertain behavior of skin: Secondary | ICD-10-CM | POA: Diagnosis not present

## 2014-01-15 DIAGNOSIS — L988 Other specified disorders of the skin and subcutaneous tissue: Secondary | ICD-10-CM | POA: Diagnosis not present

## 2014-01-15 DIAGNOSIS — L578 Other skin changes due to chronic exposure to nonionizing radiation: Secondary | ICD-10-CM | POA: Diagnosis not present

## 2014-01-31 DIAGNOSIS — L0291 Cutaneous abscess, unspecified: Secondary | ICD-10-CM | POA: Diagnosis not present

## 2014-01-31 DIAGNOSIS — L0390 Cellulitis, unspecified: Secondary | ICD-10-CM | POA: Diagnosis not present

## 2014-02-09 DIAGNOSIS — Z79899 Other long term (current) drug therapy: Secondary | ICD-10-CM | POA: Diagnosis not present

## 2014-02-09 DIAGNOSIS — Z940 Kidney transplant status: Secondary | ICD-10-CM | POA: Diagnosis not present

## 2014-02-13 DIAGNOSIS — N390 Urinary tract infection, site not specified: Secondary | ICD-10-CM | POA: Diagnosis not present

## 2014-02-27 ENCOUNTER — Ambulatory Visit (HOSPITAL_COMMUNITY)
Admission: RE | Admit: 2014-02-27 | Discharge: 2014-02-27 | Disposition: A | Discharge status: Home / Self Care | Payer: Medicare Other | Source: Ambulatory Visit | Attending: Internal Medicine | Admitting: Internal Medicine

## 2014-02-27 ENCOUNTER — Other Ambulatory Visit (HOSPITAL_COMMUNITY): Payer: Self-pay | Admitting: Internal Medicine

## 2014-02-27 DIAGNOSIS — R059 Cough, unspecified: Secondary | ICD-10-CM | POA: Diagnosis not present

## 2014-02-27 DIAGNOSIS — R05 Cough: Secondary | ICD-10-CM | POA: Insufficient documentation

## 2014-02-27 DIAGNOSIS — D71 Functional disorders of polymorphonuclear neutrophils: Secondary | ICD-10-CM | POA: Insufficient documentation

## 2014-02-27 DIAGNOSIS — J984 Other disorders of lung: Secondary | ICD-10-CM | POA: Diagnosis not present

## 2014-02-27 DIAGNOSIS — J40 Bronchitis, not specified as acute or chronic: Secondary | ICD-10-CM | POA: Insufficient documentation

## 2014-03-10 DIAGNOSIS — Z940 Kidney transplant status: Secondary | ICD-10-CM | POA: Diagnosis not present

## 2014-03-10 DIAGNOSIS — Z79899 Other long term (current) drug therapy: Secondary | ICD-10-CM | POA: Diagnosis not present

## 2014-04-06 DIAGNOSIS — Z79899 Other long term (current) drug therapy: Secondary | ICD-10-CM | POA: Diagnosis not present

## 2014-04-06 DIAGNOSIS — Z940 Kidney transplant status: Secondary | ICD-10-CM | POA: Diagnosis not present

## 2014-04-08 DIAGNOSIS — Z79899 Other long term (current) drug therapy: Secondary | ICD-10-CM | POA: Diagnosis not present

## 2014-04-08 DIAGNOSIS — Z940 Kidney transplant status: Secondary | ICD-10-CM | POA: Diagnosis not present

## 2014-04-20 DIAGNOSIS — Z79899 Other long term (current) drug therapy: Secondary | ICD-10-CM | POA: Diagnosis not present

## 2014-06-01 DIAGNOSIS — Z940 Kidney transplant status: Secondary | ICD-10-CM | POA: Diagnosis not present

## 2014-06-01 DIAGNOSIS — Z79899 Other long term (current) drug therapy: Secondary | ICD-10-CM | POA: Diagnosis not present

## 2014-06-03 DIAGNOSIS — Z124 Encounter for screening for malignant neoplasm of cervix: Secondary | ICD-10-CM | POA: Diagnosis not present

## 2014-06-03 DIAGNOSIS — Z01419 Encounter for gynecological examination (general) (routine) without abnormal findings: Secondary | ICD-10-CM | POA: Diagnosis not present

## 2014-07-16 DIAGNOSIS — Z79899 Other long term (current) drug therapy: Secondary | ICD-10-CM | POA: Diagnosis not present

## 2014-07-16 DIAGNOSIS — N2581 Secondary hyperparathyroidism of renal origin: Secondary | ICD-10-CM | POA: Diagnosis not present

## 2014-07-16 DIAGNOSIS — Z940 Kidney transplant status: Secondary | ICD-10-CM | POA: Diagnosis not present

## 2014-07-17 DIAGNOSIS — Z23 Encounter for immunization: Secondary | ICD-10-CM | POA: Diagnosis not present

## 2014-07-22 DIAGNOSIS — E785 Hyperlipidemia, unspecified: Secondary | ICD-10-CM | POA: Diagnosis not present

## 2014-07-22 DIAGNOSIS — Z940 Kidney transplant status: Secondary | ICD-10-CM | POA: Diagnosis not present

## 2014-07-22 DIAGNOSIS — Z79899 Other long term (current) drug therapy: Secondary | ICD-10-CM | POA: Diagnosis not present

## 2014-08-24 DIAGNOSIS — Z940 Kidney transplant status: Secondary | ICD-10-CM | POA: Diagnosis not present

## 2014-08-24 DIAGNOSIS — Z79899 Other long term (current) drug therapy: Secondary | ICD-10-CM | POA: Diagnosis not present

## 2014-08-25 DIAGNOSIS — R05 Cough: Secondary | ICD-10-CM | POA: Diagnosis not present

## 2014-09-03 DIAGNOSIS — Z79899 Other long term (current) drug therapy: Secondary | ICD-10-CM | POA: Diagnosis not present

## 2014-10-10 DIAGNOSIS — S93491A Sprain of other ligament of right ankle, initial encounter: Secondary | ICD-10-CM | POA: Diagnosis not present

## 2014-10-20 DIAGNOSIS — Z940 Kidney transplant status: Secondary | ICD-10-CM | POA: Diagnosis not present

## 2014-10-20 DIAGNOSIS — Z4822 Encounter for aftercare following kidney transplant: Secondary | ICD-10-CM | POA: Diagnosis not present

## 2014-10-20 DIAGNOSIS — E782 Mixed hyperlipidemia: Secondary | ICD-10-CM | POA: Diagnosis not present

## 2014-10-20 DIAGNOSIS — Z48298 Encounter for aftercare following other organ transplant: Secondary | ICD-10-CM | POA: Diagnosis not present

## 2014-10-20 DIAGNOSIS — I10 Essential (primary) hypertension: Secondary | ICD-10-CM | POA: Diagnosis not present

## 2014-10-20 DIAGNOSIS — N261 Atrophy of kidney (terminal): Secondary | ICD-10-CM | POA: Diagnosis not present

## 2014-10-20 DIAGNOSIS — I151 Hypertension secondary to other renal disorders: Secondary | ICD-10-CM | POA: Diagnosis not present

## 2014-10-20 DIAGNOSIS — N281 Cyst of kidney, acquired: Secondary | ICD-10-CM | POA: Diagnosis not present

## 2014-10-20 DIAGNOSIS — E785 Hyperlipidemia, unspecified: Secondary | ICD-10-CM | POA: Diagnosis not present

## 2014-10-26 DIAGNOSIS — Z23 Encounter for immunization: Secondary | ICD-10-CM | POA: Diagnosis not present

## 2014-11-02 DIAGNOSIS — Z79899 Other long term (current) drug therapy: Secondary | ICD-10-CM | POA: Diagnosis not present

## 2014-12-01 DIAGNOSIS — Z79899 Other long term (current) drug therapy: Secondary | ICD-10-CM | POA: Diagnosis not present

## 2014-12-16 DIAGNOSIS — Z1231 Encounter for screening mammogram for malignant neoplasm of breast: Secondary | ICD-10-CM | POA: Diagnosis not present

## 2015-01-04 DIAGNOSIS — Z940 Kidney transplant status: Secondary | ICD-10-CM | POA: Diagnosis not present

## 2015-01-04 DIAGNOSIS — Z79899 Other long term (current) drug therapy: Secondary | ICD-10-CM | POA: Diagnosis not present

## 2015-01-18 ENCOUNTER — Other Ambulatory Visit: Payer: Self-pay | Admitting: Dermatology

## 2015-01-18 DIAGNOSIS — L570 Actinic keratosis: Secondary | ICD-10-CM | POA: Diagnosis not present

## 2015-01-18 DIAGNOSIS — L821 Other seborrheic keratosis: Secondary | ICD-10-CM | POA: Diagnosis not present

## 2015-01-18 DIAGNOSIS — D485 Neoplasm of uncertain behavior of skin: Secondary | ICD-10-CM | POA: Diagnosis not present

## 2015-01-18 DIAGNOSIS — Z85828 Personal history of other malignant neoplasm of skin: Secondary | ICD-10-CM | POA: Diagnosis not present

## 2015-02-01 DIAGNOSIS — Z940 Kidney transplant status: Secondary | ICD-10-CM | POA: Diagnosis not present

## 2015-02-01 DIAGNOSIS — E785 Hyperlipidemia, unspecified: Secondary | ICD-10-CM | POA: Diagnosis not present

## 2015-02-01 DIAGNOSIS — N2581 Secondary hyperparathyroidism of renal origin: Secondary | ICD-10-CM | POA: Diagnosis not present

## 2015-02-01 DIAGNOSIS — Z79899 Other long term (current) drug therapy: Secondary | ICD-10-CM | POA: Diagnosis not present

## 2015-02-03 DIAGNOSIS — Z940 Kidney transplant status: Secondary | ICD-10-CM | POA: Diagnosis not present

## 2015-02-03 DIAGNOSIS — E785 Hyperlipidemia, unspecified: Secondary | ICD-10-CM | POA: Diagnosis not present

## 2015-02-03 DIAGNOSIS — Z79899 Other long term (current) drug therapy: Secondary | ICD-10-CM | POA: Diagnosis not present

## 2015-02-04 DIAGNOSIS — Z940 Kidney transplant status: Secondary | ICD-10-CM | POA: Diagnosis not present

## 2015-03-09 DIAGNOSIS — C44321 Squamous cell carcinoma of skin of nose: Secondary | ICD-10-CM | POA: Diagnosis not present

## 2015-04-07 DIAGNOSIS — Z79899 Other long term (current) drug therapy: Secondary | ICD-10-CM | POA: Diagnosis not present

## 2015-04-07 DIAGNOSIS — Z940 Kidney transplant status: Secondary | ICD-10-CM | POA: Diagnosis not present

## 2015-04-08 DIAGNOSIS — N390 Urinary tract infection, site not specified: Secondary | ICD-10-CM | POA: Diagnosis not present

## 2015-04-12 DIAGNOSIS — Z79899 Other long term (current) drug therapy: Secondary | ICD-10-CM | POA: Diagnosis not present

## 2015-04-21 DIAGNOSIS — Z940 Kidney transplant status: Secondary | ICD-10-CM | POA: Diagnosis not present

## 2015-05-14 DIAGNOSIS — H5203 Hypermetropia, bilateral: Secondary | ICD-10-CM | POA: Diagnosis not present

## 2015-05-14 DIAGNOSIS — H26493 Other secondary cataract, bilateral: Secondary | ICD-10-CM | POA: Diagnosis not present

## 2015-05-14 DIAGNOSIS — H52223 Regular astigmatism, bilateral: Secondary | ICD-10-CM | POA: Diagnosis not present

## 2015-05-14 DIAGNOSIS — Z961 Presence of intraocular lens: Secondary | ICD-10-CM | POA: Diagnosis not present

## 2015-06-01 DIAGNOSIS — Z940 Kidney transplant status: Secondary | ICD-10-CM | POA: Diagnosis not present

## 2015-06-15 DIAGNOSIS — H25013 Cortical age-related cataract, bilateral: Secondary | ICD-10-CM | POA: Diagnosis not present

## 2015-06-15 DIAGNOSIS — Z961 Presence of intraocular lens: Secondary | ICD-10-CM | POA: Diagnosis not present

## 2015-06-15 DIAGNOSIS — H26492 Other secondary cataract, left eye: Secondary | ICD-10-CM | POA: Diagnosis not present

## 2015-06-15 DIAGNOSIS — H26491 Other secondary cataract, right eye: Secondary | ICD-10-CM | POA: Diagnosis not present

## 2015-06-17 DIAGNOSIS — Z124 Encounter for screening for malignant neoplasm of cervix: Secondary | ICD-10-CM | POA: Diagnosis not present

## 2015-06-17 DIAGNOSIS — Z01419 Encounter for gynecological examination (general) (routine) without abnormal findings: Secondary | ICD-10-CM | POA: Diagnosis not present

## 2015-06-17 DIAGNOSIS — Z940 Kidney transplant status: Secondary | ICD-10-CM | POA: Diagnosis not present

## 2015-06-22 DIAGNOSIS — H52223 Regular astigmatism, bilateral: Secondary | ICD-10-CM | POA: Diagnosis not present

## 2015-06-22 DIAGNOSIS — Z961 Presence of intraocular lens: Secondary | ICD-10-CM | POA: Diagnosis not present

## 2015-06-22 DIAGNOSIS — Z9842 Cataract extraction status, left eye: Secondary | ICD-10-CM | POA: Diagnosis not present

## 2015-06-22 DIAGNOSIS — H5203 Hypermetropia, bilateral: Secondary | ICD-10-CM | POA: Diagnosis not present

## 2015-06-22 DIAGNOSIS — H5989 Other postprocedural complications and disorders of eye and adnexa, not elsewhere classified: Secondary | ICD-10-CM | POA: Diagnosis not present

## 2015-06-22 DIAGNOSIS — H524 Presbyopia: Secondary | ICD-10-CM | POA: Diagnosis not present

## 2015-06-29 DIAGNOSIS — H26491 Other secondary cataract, right eye: Secondary | ICD-10-CM | POA: Diagnosis not present

## 2015-07-06 DIAGNOSIS — H5203 Hypermetropia, bilateral: Secondary | ICD-10-CM | POA: Diagnosis not present

## 2015-07-06 DIAGNOSIS — Z961 Presence of intraocular lens: Secondary | ICD-10-CM | POA: Diagnosis not present

## 2015-07-06 DIAGNOSIS — H52223 Regular astigmatism, bilateral: Secondary | ICD-10-CM | POA: Diagnosis not present

## 2015-08-03 DIAGNOSIS — Z940 Kidney transplant status: Secondary | ICD-10-CM | POA: Diagnosis not present

## 2015-08-03 DIAGNOSIS — Z23 Encounter for immunization: Secondary | ICD-10-CM | POA: Diagnosis not present

## 2015-08-03 DIAGNOSIS — E785 Hyperlipidemia, unspecified: Secondary | ICD-10-CM | POA: Diagnosis not present

## 2015-08-09 DIAGNOSIS — Z79899 Other long term (current) drug therapy: Secondary | ICD-10-CM | POA: Diagnosis not present

## 2015-08-09 DIAGNOSIS — Z940 Kidney transplant status: Secondary | ICD-10-CM | POA: Diagnosis not present

## 2015-08-23 DIAGNOSIS — Z85828 Personal history of other malignant neoplasm of skin: Secondary | ICD-10-CM | POA: Diagnosis not present

## 2015-08-23 DIAGNOSIS — Z872 Personal history of diseases of the skin and subcutaneous tissue: Secondary | ICD-10-CM | POA: Diagnosis not present

## 2015-08-23 DIAGNOSIS — L821 Other seborrheic keratosis: Secondary | ICD-10-CM | POA: Diagnosis not present

## 2015-08-23 DIAGNOSIS — L738 Other specified follicular disorders: Secondary | ICD-10-CM | POA: Diagnosis not present

## 2015-09-24 DIAGNOSIS — R05 Cough: Secondary | ICD-10-CM | POA: Diagnosis not present

## 2015-09-24 DIAGNOSIS — Z6824 Body mass index (BMI) 24.0-24.9, adult: Secondary | ICD-10-CM | POA: Diagnosis not present

## 2015-09-29 DIAGNOSIS — H1033 Unspecified acute conjunctivitis, bilateral: Secondary | ICD-10-CM | POA: Diagnosis not present

## 2015-10-08 DIAGNOSIS — J069 Acute upper respiratory infection, unspecified: Secondary | ICD-10-CM | POA: Diagnosis not present

## 2015-10-08 DIAGNOSIS — Z6824 Body mass index (BMI) 24.0-24.9, adult: Secondary | ICD-10-CM | POA: Diagnosis not present

## 2015-10-12 DIAGNOSIS — Z79899 Other long term (current) drug therapy: Secondary | ICD-10-CM | POA: Diagnosis not present

## 2015-10-12 DIAGNOSIS — E785 Hyperlipidemia, unspecified: Secondary | ICD-10-CM | POA: Diagnosis not present

## 2015-10-12 DIAGNOSIS — Z4822 Encounter for aftercare following kidney transplant: Secondary | ICD-10-CM | POA: Diagnosis not present

## 2015-10-12 DIAGNOSIS — E782 Mixed hyperlipidemia: Secondary | ICD-10-CM | POA: Diagnosis not present

## 2015-10-12 DIAGNOSIS — N261 Atrophy of kidney (terminal): Secondary | ICD-10-CM | POA: Diagnosis not present

## 2015-10-12 DIAGNOSIS — Z85828 Personal history of other malignant neoplasm of skin: Secondary | ICD-10-CM | POA: Diagnosis not present

## 2015-10-12 DIAGNOSIS — D849 Immunodeficiency, unspecified: Secondary | ICD-10-CM | POA: Insufficient documentation

## 2015-10-12 DIAGNOSIS — D899 Disorder involving the immune mechanism, unspecified: Secondary | ICD-10-CM | POA: Diagnosis not present

## 2015-10-12 DIAGNOSIS — Z0000 Encounter for general adult medical examination without abnormal findings: Secondary | ICD-10-CM | POA: Diagnosis not present

## 2015-10-12 DIAGNOSIS — I151 Hypertension secondary to other renal disorders: Secondary | ICD-10-CM | POA: Diagnosis not present

## 2015-10-12 DIAGNOSIS — N858 Other specified noninflammatory disorders of uterus: Secondary | ICD-10-CM | POA: Diagnosis not present

## 2015-10-12 DIAGNOSIS — Z940 Kidney transplant status: Secondary | ICD-10-CM | POA: Diagnosis not present

## 2015-10-12 DIAGNOSIS — Z48298 Encounter for aftercare following other organ transplant: Secondary | ICD-10-CM | POA: Diagnosis not present

## 2015-10-12 DIAGNOSIS — N2889 Other specified disorders of kidney and ureter: Secondary | ICD-10-CM | POA: Diagnosis not present

## 2015-10-12 DIAGNOSIS — Z6823 Body mass index (BMI) 23.0-23.9, adult: Secondary | ICD-10-CM | POA: Diagnosis not present

## 2015-10-12 DIAGNOSIS — M25552 Pain in left hip: Secondary | ICD-10-CM | POA: Diagnosis not present

## 2015-10-12 DIAGNOSIS — R0981 Nasal congestion: Secondary | ICD-10-CM | POA: Diagnosis not present

## 2015-10-12 DIAGNOSIS — M47819 Spondylosis without myelopathy or radiculopathy, site unspecified: Secondary | ICD-10-CM | POA: Diagnosis not present

## 2015-10-12 DIAGNOSIS — N390 Urinary tract infection, site not specified: Secondary | ICD-10-CM | POA: Diagnosis not present

## 2015-10-12 DIAGNOSIS — Z7982 Long term (current) use of aspirin: Secondary | ICD-10-CM | POA: Diagnosis not present

## 2015-10-12 DIAGNOSIS — N281 Cyst of kidney, acquired: Secondary | ICD-10-CM | POA: Diagnosis not present

## 2015-12-08 DIAGNOSIS — Z940 Kidney transplant status: Secondary | ICD-10-CM | POA: Diagnosis not present

## 2015-12-23 DIAGNOSIS — Z1231 Encounter for screening mammogram for malignant neoplasm of breast: Secondary | ICD-10-CM | POA: Diagnosis not present

## 2016-01-10 DIAGNOSIS — N390 Urinary tract infection, site not specified: Secondary | ICD-10-CM | POA: Diagnosis not present

## 2016-01-10 DIAGNOSIS — Z940 Kidney transplant status: Secondary | ICD-10-CM | POA: Diagnosis not present

## 2016-01-10 DIAGNOSIS — D899 Disorder involving the immune mechanism, unspecified: Secondary | ICD-10-CM | POA: Diagnosis not present

## 2016-01-20 DIAGNOSIS — Z86018 Personal history of other benign neoplasm: Secondary | ICD-10-CM | POA: Diagnosis not present

## 2016-01-20 DIAGNOSIS — D0462 Carcinoma in situ of skin of left upper limb, including shoulder: Secondary | ICD-10-CM | POA: Diagnosis not present

## 2016-01-20 DIAGNOSIS — L570 Actinic keratosis: Secondary | ICD-10-CM | POA: Diagnosis not present

## 2016-01-20 DIAGNOSIS — L821 Other seborrheic keratosis: Secondary | ICD-10-CM | POA: Diagnosis not present

## 2016-01-20 DIAGNOSIS — Z85828 Personal history of other malignant neoplasm of skin: Secondary | ICD-10-CM | POA: Diagnosis not present

## 2016-01-20 DIAGNOSIS — Z23 Encounter for immunization: Secondary | ICD-10-CM | POA: Diagnosis not present

## 2016-01-20 DIAGNOSIS — D485 Neoplasm of uncertain behavior of skin: Secondary | ICD-10-CM | POA: Diagnosis not present

## 2016-01-20 DIAGNOSIS — D0461 Carcinoma in situ of skin of right upper limb, including shoulder: Secondary | ICD-10-CM | POA: Diagnosis not present

## 2016-01-24 DIAGNOSIS — Z940 Kidney transplant status: Secondary | ICD-10-CM | POA: Diagnosis not present

## 2016-01-24 DIAGNOSIS — N390 Urinary tract infection, site not specified: Secondary | ICD-10-CM | POA: Diagnosis not present

## 2016-01-24 DIAGNOSIS — D899 Disorder involving the immune mechanism, unspecified: Secondary | ICD-10-CM | POA: Diagnosis not present

## 2016-02-10 DIAGNOSIS — Z940 Kidney transplant status: Secondary | ICD-10-CM | POA: Diagnosis not present

## 2016-02-10 DIAGNOSIS — E785 Hyperlipidemia, unspecified: Secondary | ICD-10-CM | POA: Diagnosis not present

## 2016-02-11 DIAGNOSIS — I10 Essential (primary) hypertension: Secondary | ICD-10-CM | POA: Diagnosis not present

## 2016-02-11 DIAGNOSIS — Z79899 Other long term (current) drug therapy: Secondary | ICD-10-CM | POA: Diagnosis not present

## 2016-02-11 DIAGNOSIS — Z940 Kidney transplant status: Secondary | ICD-10-CM | POA: Diagnosis not present

## 2016-03-01 DIAGNOSIS — D0462 Carcinoma in situ of skin of left upper limb, including shoulder: Secondary | ICD-10-CM | POA: Diagnosis not present

## 2016-03-01 DIAGNOSIS — D0461 Carcinoma in situ of skin of right upper limb, including shoulder: Secondary | ICD-10-CM | POA: Diagnosis not present

## 2016-03-22 DIAGNOSIS — Z940 Kidney transplant status: Secondary | ICD-10-CM | POA: Diagnosis not present

## 2016-03-22 DIAGNOSIS — N390 Urinary tract infection, site not specified: Secondary | ICD-10-CM | POA: Diagnosis not present

## 2016-03-22 DIAGNOSIS — D899 Disorder involving the immune mechanism, unspecified: Secondary | ICD-10-CM | POA: Diagnosis not present

## 2016-04-05 DIAGNOSIS — D899 Disorder involving the immune mechanism, unspecified: Secondary | ICD-10-CM | POA: Diagnosis not present

## 2016-04-05 DIAGNOSIS — N390 Urinary tract infection, site not specified: Secondary | ICD-10-CM | POA: Diagnosis not present

## 2016-04-05 DIAGNOSIS — Z940 Kidney transplant status: Secondary | ICD-10-CM | POA: Diagnosis not present

## 2016-04-12 DIAGNOSIS — Z940 Kidney transplant status: Secondary | ICD-10-CM | POA: Diagnosis not present

## 2016-05-24 DIAGNOSIS — H04123 Dry eye syndrome of bilateral lacrimal glands: Secondary | ICD-10-CM | POA: Diagnosis not present

## 2016-05-24 DIAGNOSIS — H52223 Regular astigmatism, bilateral: Secondary | ICD-10-CM | POA: Diagnosis not present

## 2016-05-24 DIAGNOSIS — H5203 Hypermetropia, bilateral: Secondary | ICD-10-CM | POA: Diagnosis not present

## 2016-06-07 DIAGNOSIS — Z940 Kidney transplant status: Secondary | ICD-10-CM | POA: Diagnosis not present

## 2016-06-07 DIAGNOSIS — N390 Urinary tract infection, site not specified: Secondary | ICD-10-CM | POA: Diagnosis not present

## 2016-06-07 DIAGNOSIS — D899 Disorder involving the immune mechanism, unspecified: Secondary | ICD-10-CM | POA: Diagnosis not present

## 2016-06-07 DIAGNOSIS — E785 Hyperlipidemia, unspecified: Secondary | ICD-10-CM | POA: Diagnosis not present

## 2016-06-27 DIAGNOSIS — Z124 Encounter for screening for malignant neoplasm of cervix: Secondary | ICD-10-CM | POA: Diagnosis not present

## 2016-06-27 DIAGNOSIS — Z85828 Personal history of other malignant neoplasm of skin: Secondary | ICD-10-CM | POA: Diagnosis not present

## 2016-06-27 DIAGNOSIS — L570 Actinic keratosis: Secondary | ICD-10-CM | POA: Diagnosis not present

## 2016-06-27 DIAGNOSIS — Z01419 Encounter for gynecological examination (general) (routine) without abnormal findings: Secondary | ICD-10-CM | POA: Diagnosis not present

## 2016-06-27 DIAGNOSIS — L910 Hypertrophic scar: Secondary | ICD-10-CM | POA: Diagnosis not present

## 2016-08-01 DIAGNOSIS — Z940 Kidney transplant status: Secondary | ICD-10-CM | POA: Diagnosis not present

## 2016-08-01 DIAGNOSIS — N390 Urinary tract infection, site not specified: Secondary | ICD-10-CM | POA: Diagnosis not present

## 2016-08-01 DIAGNOSIS — D899 Disorder involving the immune mechanism, unspecified: Secondary | ICD-10-CM | POA: Diagnosis not present

## 2016-08-03 DIAGNOSIS — Z23 Encounter for immunization: Secondary | ICD-10-CM | POA: Diagnosis not present

## 2016-08-21 DIAGNOSIS — M278 Other specified diseases of jaws: Secondary | ICD-10-CM | POA: Diagnosis not present

## 2016-08-21 DIAGNOSIS — M2740 Unspecified cyst of jaw: Secondary | ICD-10-CM | POA: Diagnosis not present

## 2016-09-01 DIAGNOSIS — I10 Essential (primary) hypertension: Secondary | ICD-10-CM | POA: Diagnosis not present

## 2016-09-01 DIAGNOSIS — Z79899 Other long term (current) drug therapy: Secondary | ICD-10-CM | POA: Diagnosis not present

## 2016-09-01 DIAGNOSIS — Z940 Kidney transplant status: Secondary | ICD-10-CM | POA: Diagnosis not present

## 2016-10-04 DIAGNOSIS — Z85828 Personal history of other malignant neoplasm of skin: Secondary | ICD-10-CM | POA: Diagnosis not present

## 2016-10-04 DIAGNOSIS — N281 Cyst of kidney, acquired: Secondary | ICD-10-CM | POA: Diagnosis not present

## 2016-10-04 DIAGNOSIS — E782 Mixed hyperlipidemia: Secondary | ICD-10-CM | POA: Diagnosis not present

## 2016-10-04 DIAGNOSIS — Z940 Kidney transplant status: Secondary | ICD-10-CM | POA: Diagnosis not present

## 2016-10-04 DIAGNOSIS — I10 Essential (primary) hypertension: Secondary | ICD-10-CM | POA: Diagnosis not present

## 2016-10-04 DIAGNOSIS — Z0000 Encounter for general adult medical examination without abnormal findings: Secondary | ICD-10-CM | POA: Diagnosis not present

## 2016-10-04 DIAGNOSIS — E785 Hyperlipidemia, unspecified: Secondary | ICD-10-CM | POA: Diagnosis not present

## 2016-10-04 DIAGNOSIS — Z7982 Long term (current) use of aspirin: Secondary | ICD-10-CM | POA: Diagnosis not present

## 2016-10-04 DIAGNOSIS — Z48298 Encounter for aftercare following other organ transplant: Secondary | ICD-10-CM | POA: Diagnosis not present

## 2016-10-04 DIAGNOSIS — N261 Atrophy of kidney (terminal): Secondary | ICD-10-CM | POA: Diagnosis not present

## 2016-10-30 DIAGNOSIS — Z940 Kidney transplant status: Secondary | ICD-10-CM | POA: Diagnosis not present

## 2016-10-30 DIAGNOSIS — N390 Urinary tract infection, site not specified: Secondary | ICD-10-CM | POA: Diagnosis not present

## 2016-10-30 DIAGNOSIS — D899 Disorder involving the immune mechanism, unspecified: Secondary | ICD-10-CM | POA: Diagnosis not present

## 2016-11-10 DIAGNOSIS — Z940 Kidney transplant status: Secondary | ICD-10-CM | POA: Diagnosis not present

## 2016-11-10 DIAGNOSIS — D899 Disorder involving the immune mechanism, unspecified: Secondary | ICD-10-CM | POA: Diagnosis not present

## 2016-11-10 DIAGNOSIS — N390 Urinary tract infection, site not specified: Secondary | ICD-10-CM | POA: Diagnosis not present

## 2016-11-15 DIAGNOSIS — N390 Urinary tract infection, site not specified: Secondary | ICD-10-CM | POA: Diagnosis not present

## 2016-12-25 DIAGNOSIS — Z1231 Encounter for screening mammogram for malignant neoplasm of breast: Secondary | ICD-10-CM | POA: Diagnosis not present

## 2016-12-27 DIAGNOSIS — E785 Hyperlipidemia, unspecified: Secondary | ICD-10-CM | POA: Diagnosis not present

## 2016-12-27 DIAGNOSIS — Z940 Kidney transplant status: Secondary | ICD-10-CM | POA: Diagnosis not present

## 2016-12-29 DIAGNOSIS — N390 Urinary tract infection, site not specified: Secondary | ICD-10-CM | POA: Diagnosis not present

## 2017-01-18 DIAGNOSIS — Z940 Kidney transplant status: Secondary | ICD-10-CM | POA: Diagnosis not present

## 2017-01-18 DIAGNOSIS — N390 Urinary tract infection, site not specified: Secondary | ICD-10-CM | POA: Diagnosis not present

## 2017-01-22 DIAGNOSIS — L814 Other melanin hyperpigmentation: Secondary | ICD-10-CM | POA: Diagnosis not present

## 2017-01-22 DIAGNOSIS — D1801 Hemangioma of skin and subcutaneous tissue: Secondary | ICD-10-CM | POA: Diagnosis not present

## 2017-01-22 DIAGNOSIS — Z86018 Personal history of other benign neoplasm: Secondary | ICD-10-CM | POA: Diagnosis not present

## 2017-01-22 DIAGNOSIS — L821 Other seborrheic keratosis: Secondary | ICD-10-CM | POA: Diagnosis not present

## 2017-01-22 DIAGNOSIS — D225 Melanocytic nevi of trunk: Secondary | ICD-10-CM | POA: Diagnosis not present

## 2017-01-22 DIAGNOSIS — Z85828 Personal history of other malignant neoplasm of skin: Secondary | ICD-10-CM | POA: Diagnosis not present

## 2017-03-05 DIAGNOSIS — Z940 Kidney transplant status: Secondary | ICD-10-CM | POA: Diagnosis not present

## 2017-03-05 DIAGNOSIS — N390 Urinary tract infection, site not specified: Secondary | ICD-10-CM | POA: Diagnosis not present

## 2017-03-07 DIAGNOSIS — Z940 Kidney transplant status: Secondary | ICD-10-CM | POA: Diagnosis not present

## 2017-03-07 DIAGNOSIS — D899 Disorder involving the immune mechanism, unspecified: Secondary | ICD-10-CM | POA: Diagnosis not present

## 2017-03-13 DIAGNOSIS — Z940 Kidney transplant status: Secondary | ICD-10-CM | POA: Diagnosis not present

## 2017-03-28 DIAGNOSIS — Z940 Kidney transplant status: Secondary | ICD-10-CM | POA: Diagnosis not present

## 2017-03-28 DIAGNOSIS — I10 Essential (primary) hypertension: Secondary | ICD-10-CM | POA: Diagnosis not present

## 2017-03-28 DIAGNOSIS — Z79899 Other long term (current) drug therapy: Secondary | ICD-10-CM | POA: Diagnosis not present

## 2017-04-04 DIAGNOSIS — Z961 Presence of intraocular lens: Secondary | ICD-10-CM | POA: Diagnosis not present

## 2017-04-04 DIAGNOSIS — H35033 Hypertensive retinopathy, bilateral: Secondary | ICD-10-CM | POA: Diagnosis not present

## 2017-04-04 DIAGNOSIS — H5203 Hypermetropia, bilateral: Secondary | ICD-10-CM | POA: Diagnosis not present

## 2017-04-04 DIAGNOSIS — I10 Essential (primary) hypertension: Secondary | ICD-10-CM | POA: Diagnosis not present

## 2017-04-04 DIAGNOSIS — H52223 Regular astigmatism, bilateral: Secondary | ICD-10-CM | POA: Diagnosis not present

## 2017-05-01 DIAGNOSIS — N10 Acute tubulo-interstitial nephritis: Secondary | ICD-10-CM | POA: Diagnosis not present

## 2017-05-01 DIAGNOSIS — Z940 Kidney transplant status: Secondary | ICD-10-CM | POA: Diagnosis not present

## 2017-05-21 DIAGNOSIS — Z940 Kidney transplant status: Secondary | ICD-10-CM | POA: Diagnosis not present

## 2017-06-19 DIAGNOSIS — K1379 Other lesions of oral mucosa: Secondary | ICD-10-CM | POA: Diagnosis not present

## 2017-06-19 DIAGNOSIS — M279 Disease of jaws, unspecified: Secondary | ICD-10-CM | POA: Diagnosis not present

## 2017-06-19 DIAGNOSIS — M272 Inflammatory conditions of jaws: Secondary | ICD-10-CM | POA: Diagnosis not present

## 2017-06-28 DIAGNOSIS — Z940 Kidney transplant status: Secondary | ICD-10-CM | POA: Diagnosis not present

## 2017-06-28 DIAGNOSIS — D899 Disorder involving the immune mechanism, unspecified: Secondary | ICD-10-CM | POA: Diagnosis not present

## 2017-06-28 DIAGNOSIS — N10 Acute tubulo-interstitial nephritis: Secondary | ICD-10-CM | POA: Diagnosis not present

## 2017-07-02 DIAGNOSIS — Z940 Kidney transplant status: Secondary | ICD-10-CM | POA: Diagnosis not present

## 2017-07-27 DIAGNOSIS — Z124 Encounter for screening for malignant neoplasm of cervix: Secondary | ICD-10-CM | POA: Diagnosis not present

## 2017-07-27 DIAGNOSIS — Z01419 Encounter for gynecological examination (general) (routine) without abnormal findings: Secondary | ICD-10-CM | POA: Diagnosis not present

## 2017-07-27 DIAGNOSIS — Z23 Encounter for immunization: Secondary | ICD-10-CM | POA: Diagnosis not present

## 2017-08-27 DIAGNOSIS — Z940 Kidney transplant status: Secondary | ICD-10-CM | POA: Diagnosis not present

## 2017-08-27 DIAGNOSIS — D899 Disorder involving the immune mechanism, unspecified: Secondary | ICD-10-CM | POA: Diagnosis not present

## 2017-09-10 DIAGNOSIS — Z940 Kidney transplant status: Secondary | ICD-10-CM | POA: Diagnosis not present

## 2017-09-21 DIAGNOSIS — Z940 Kidney transplant status: Secondary | ICD-10-CM | POA: Diagnosis not present

## 2017-09-27 DIAGNOSIS — Z940 Kidney transplant status: Secondary | ICD-10-CM | POA: Diagnosis not present

## 2017-09-27 DIAGNOSIS — I10 Essential (primary) hypertension: Secondary | ICD-10-CM | POA: Diagnosis not present

## 2017-10-05 DIAGNOSIS — E785 Hyperlipidemia, unspecified: Secondary | ICD-10-CM | POA: Diagnosis not present

## 2017-10-05 DIAGNOSIS — N281 Cyst of kidney, acquired: Secondary | ICD-10-CM | POA: Diagnosis not present

## 2017-10-05 DIAGNOSIS — Z79899 Other long term (current) drug therapy: Secondary | ICD-10-CM | POA: Diagnosis not present

## 2017-10-05 DIAGNOSIS — M79662 Pain in left lower leg: Secondary | ICD-10-CM | POA: Diagnosis not present

## 2017-10-05 DIAGNOSIS — I10 Essential (primary) hypertension: Secondary | ICD-10-CM | POA: Diagnosis not present

## 2017-10-05 DIAGNOSIS — N390 Urinary tract infection, site not specified: Secondary | ICD-10-CM | POA: Diagnosis not present

## 2017-10-05 DIAGNOSIS — Z7982 Long term (current) use of aspirin: Secondary | ICD-10-CM | POA: Diagnosis not present

## 2017-10-05 DIAGNOSIS — Z6824 Body mass index (BMI) 24.0-24.9, adult: Secondary | ICD-10-CM | POA: Diagnosis not present

## 2017-10-05 DIAGNOSIS — D899 Disorder involving the immune mechanism, unspecified: Secondary | ICD-10-CM | POA: Diagnosis not present

## 2017-10-05 DIAGNOSIS — Z48298 Encounter for aftercare following other organ transplant: Secondary | ICD-10-CM | POA: Diagnosis not present

## 2017-10-05 DIAGNOSIS — Z940 Kidney transplant status: Secondary | ICD-10-CM | POA: Diagnosis not present

## 2017-10-05 DIAGNOSIS — Z4822 Encounter for aftercare following kidney transplant: Secondary | ICD-10-CM | POA: Diagnosis not present

## 2017-10-05 DIAGNOSIS — M79661 Pain in right lower leg: Secondary | ICD-10-CM | POA: Diagnosis not present

## 2018-04-09 DIAGNOSIS — Z6825 Body mass index (BMI) 25.0-25.9, adult: Secondary | ICD-10-CM | POA: Diagnosis not present

## 2018-04-09 DIAGNOSIS — Z940 Kidney transplant status: Secondary | ICD-10-CM | POA: Diagnosis not present

## 2018-04-09 DIAGNOSIS — I10 Essential (primary) hypertension: Secondary | ICD-10-CM | POA: Diagnosis not present

## 2018-06-04 DIAGNOSIS — T8610 Unspecified complication of kidney transplant: Secondary | ICD-10-CM | POA: Diagnosis not present

## 2018-06-04 DIAGNOSIS — E1129 Type 2 diabetes mellitus with other diabetic kidney complication: Secondary | ICD-10-CM | POA: Diagnosis not present

## 2018-06-04 DIAGNOSIS — D631 Anemia in chronic kidney disease: Secondary | ICD-10-CM | POA: Diagnosis not present

## 2018-06-04 DIAGNOSIS — D899 Disorder involving the immune mechanism, unspecified: Secondary | ICD-10-CM | POA: Diagnosis not present

## 2018-06-04 DIAGNOSIS — Z114 Encounter for screening for human immunodeficiency virus [HIV]: Secondary | ICD-10-CM | POA: Diagnosis not present

## 2018-06-04 DIAGNOSIS — Z940 Kidney transplant status: Secondary | ICD-10-CM | POA: Diagnosis not present

## 2018-06-04 DIAGNOSIS — Z79899 Other long term (current) drug therapy: Secondary | ICD-10-CM | POA: Diagnosis not present

## 2018-06-04 DIAGNOSIS — Z9483 Pancreas transplant status: Secondary | ICD-10-CM | POA: Diagnosis not present

## 2018-06-04 DIAGNOSIS — E559 Vitamin D deficiency, unspecified: Secondary | ICD-10-CM | POA: Diagnosis not present

## 2018-06-04 DIAGNOSIS — N390 Urinary tract infection, site not specified: Secondary | ICD-10-CM | POA: Diagnosis not present

## 2018-06-04 DIAGNOSIS — B259 Cytomegaloviral disease, unspecified: Secondary | ICD-10-CM | POA: Diagnosis not present

## 2018-10-11 DIAGNOSIS — B259 Cytomegaloviral disease, unspecified: Secondary | ICD-10-CM | POA: Diagnosis not present

## 2018-10-11 DIAGNOSIS — E1129 Type 2 diabetes mellitus with other diabetic kidney complication: Secondary | ICD-10-CM | POA: Diagnosis not present

## 2018-10-11 DIAGNOSIS — Z940 Kidney transplant status: Secondary | ICD-10-CM | POA: Diagnosis not present

## 2018-10-11 DIAGNOSIS — Z789 Other specified health status: Secondary | ICD-10-CM | POA: Diagnosis not present

## 2018-10-11 DIAGNOSIS — D631 Anemia in chronic kidney disease: Secondary | ICD-10-CM | POA: Diagnosis not present

## 2018-10-11 DIAGNOSIS — Z9483 Pancreas transplant status: Secondary | ICD-10-CM | POA: Diagnosis not present

## 2018-10-11 DIAGNOSIS — N390 Urinary tract infection, site not specified: Secondary | ICD-10-CM | POA: Diagnosis not present

## 2018-10-11 DIAGNOSIS — Z09 Encounter for follow-up examination after completed treatment for conditions other than malignant neoplasm: Secondary | ICD-10-CM | POA: Diagnosis not present

## 2018-10-11 DIAGNOSIS — E559 Vitamin D deficiency, unspecified: Secondary | ICD-10-CM | POA: Diagnosis not present

## 2018-10-11 DIAGNOSIS — T8610 Unspecified complication of kidney transplant: Secondary | ICD-10-CM | POA: Diagnosis not present

## 2018-10-11 DIAGNOSIS — D899 Disorder involving the immune mechanism, unspecified: Secondary | ICD-10-CM | POA: Diagnosis not present

## 2018-10-21 DIAGNOSIS — Z79899 Other long term (current) drug therapy: Secondary | ICD-10-CM | POA: Diagnosis not present

## 2018-10-21 DIAGNOSIS — D899 Disorder involving the immune mechanism, unspecified: Secondary | ICD-10-CM | POA: Diagnosis not present

## 2018-10-21 DIAGNOSIS — Z940 Kidney transplant status: Secondary | ICD-10-CM | POA: Diagnosis not present

## 2018-10-21 DIAGNOSIS — Z09 Encounter for follow-up examination after completed treatment for conditions other than malignant neoplasm: Secondary | ICD-10-CM | POA: Diagnosis not present

## 2018-10-21 DIAGNOSIS — B259 Cytomegaloviral disease, unspecified: Secondary | ICD-10-CM | POA: Diagnosis not present

## 2018-10-21 DIAGNOSIS — E1129 Type 2 diabetes mellitus with other diabetic kidney complication: Secondary | ICD-10-CM | POA: Diagnosis not present

## 2018-10-21 DIAGNOSIS — Z9483 Pancreas transplant status: Secondary | ICD-10-CM | POA: Diagnosis not present

## 2018-10-21 DIAGNOSIS — N390 Urinary tract infection, site not specified: Secondary | ICD-10-CM | POA: Diagnosis not present

## 2018-10-21 DIAGNOSIS — D631 Anemia in chronic kidney disease: Secondary | ICD-10-CM | POA: Diagnosis not present

## 2018-10-21 DIAGNOSIS — Z114 Encounter for screening for human immunodeficiency virus [HIV]: Secondary | ICD-10-CM | POA: Diagnosis not present

## 2018-10-21 DIAGNOSIS — T8610 Unspecified complication of kidney transplant: Secondary | ICD-10-CM | POA: Diagnosis not present

## 2019-08-20 ENCOUNTER — Encounter (INDEPENDENT_AMBULATORY_CARE_PROVIDER_SITE_OTHER): Payer: Self-pay | Admitting: *Deleted

## 2020-05-13 ENCOUNTER — Other Ambulatory Visit (INDEPENDENT_AMBULATORY_CARE_PROVIDER_SITE_OTHER): Payer: Self-pay | Admitting: *Deleted

## 2020-05-14 ENCOUNTER — Other Ambulatory Visit (INDEPENDENT_AMBULATORY_CARE_PROVIDER_SITE_OTHER): Payer: Self-pay | Admitting: *Deleted

## 2020-05-14 DIAGNOSIS — Z860100 Personal history of colon polyps, unspecified: Secondary | ICD-10-CM

## 2020-05-25 ENCOUNTER — Encounter (INDEPENDENT_AMBULATORY_CARE_PROVIDER_SITE_OTHER): Payer: Self-pay | Admitting: *Deleted

## 2020-05-25 ENCOUNTER — Telehealth (INDEPENDENT_AMBULATORY_CARE_PROVIDER_SITE_OTHER): Payer: Self-pay | Admitting: *Deleted

## 2020-05-25 NOTE — Telephone Encounter (Signed)
Patient needs Plenvu (copay card) ° °

## 2020-05-26 MED ORDER — PLENVU 140 G PO SOLR: 1.0000 | Freq: Once | ORAL | 0 refills | Status: AC

## 2020-05-29 ENCOUNTER — Encounter (HOSPITAL_COMMUNITY): Payer: Self-pay | Admitting: Emergency Medicine

## 2020-05-29 ENCOUNTER — Emergency Department (HOSPITAL_COMMUNITY)
Admission: EM | Admit: 2020-05-29 | Discharge: 2020-05-29 | Disposition: A | Discharge status: Home / Self Care | Payer: Medicare HMO | Attending: Emergency Medicine | Admitting: Emergency Medicine

## 2020-05-29 ENCOUNTER — Other Ambulatory Visit: Payer: Self-pay

## 2020-05-29 ENCOUNTER — Emergency Department (HOSPITAL_COMMUNITY): Payer: Medicare HMO

## 2020-05-29 DIAGNOSIS — Z87891 Personal history of nicotine dependence: Secondary | ICD-10-CM | POA: Insufficient documentation

## 2020-05-29 DIAGNOSIS — R531 Weakness: Secondary | ICD-10-CM | POA: Diagnosis present

## 2020-05-29 DIAGNOSIS — Z7982 Long term (current) use of aspirin: Secondary | ICD-10-CM | POA: Insufficient documentation

## 2020-05-29 DIAGNOSIS — N189 Chronic kidney disease, unspecified: Secondary | ICD-10-CM | POA: Insufficient documentation

## 2020-05-29 LAB — CBC WITH DIFFERENTIAL/PLATELET
Abs Immature Granulocytes: 0.02 10*3/uL (ref 0.00–0.07)
Basophils Absolute: 0 10*3/uL (ref 0.0–0.1)
Basophils Relative: 0 %
Eosinophils Absolute: 0.1 10*3/uL (ref 0.0–0.5)
Eosinophils Relative: 1 %
HCT: 47.3 % — ABNORMAL HIGH (ref 36.0–46.0)
Hemoglobin: 14.9 g/dL (ref 12.0–15.0)
Immature Granulocytes: 0 %
Lymphocytes Relative: 7 %
Lymphs Abs: 0.5 10*3/uL — ABNORMAL LOW (ref 0.7–4.0)
MCH: 28.5 pg (ref 26.0–34.0)
MCHC: 31.5 g/dL (ref 30.0–36.0)
MCV: 90.6 fL (ref 80.0–100.0)
Monocytes Absolute: 0.5 10*3/uL (ref 0.1–1.0)
Monocytes Relative: 7 %
Neutro Abs: 6 10*3/uL (ref 1.7–7.7)
Neutrophils Relative %: 85 %
Platelets: 128 10*3/uL — ABNORMAL LOW (ref 150–400)
RBC: 5.22 MIL/uL — ABNORMAL HIGH (ref 3.87–5.11)
RDW: 13.9 % (ref 11.5–15.5)
WBC: 7.2 10*3/uL (ref 4.0–10.5)
nRBC: 0 % (ref 0.0–0.2)

## 2020-05-29 LAB — URINALYSIS, ROUTINE W REFLEX MICROSCOPIC
Bilirubin Urine: NEGATIVE
Glucose, UA: NEGATIVE mg/dL
Hgb urine dipstick: NEGATIVE
Ketones, ur: NEGATIVE mg/dL
Leukocytes,Ua: NEGATIVE
Nitrite: NEGATIVE
Protein, ur: NEGATIVE mg/dL
Specific Gravity, Urine: 1.006 (ref 1.005–1.030)
pH: 7 (ref 5.0–8.0)

## 2020-05-29 LAB — TROPONIN I (HIGH SENSITIVITY)
Troponin I (High Sensitivity): 14 ng/L (ref <18)
Troponin I (High Sensitivity): 19 ng/L — ABNORMAL HIGH (ref <18)

## 2020-05-29 LAB — COMPREHENSIVE METABOLIC PANEL
ALT: 21 U/L (ref 0–44)
AST: 21 U/L (ref 15–41)
Albumin: 3.8 g/dL (ref 3.5–5.0)
Alkaline Phosphatase: 59 U/L (ref 38–126)
Anion gap: 6 (ref 5–15)
BUN: 10 mg/dL (ref 8–23)
CO2: 24 mmol/L (ref 22–32)
Calcium: 9 mg/dL (ref 8.9–10.3)
Chloride: 109 mmol/L (ref 98–111)
Creatinine, Ser: 0.53 mg/dL (ref 0.44–1.00)
GFR calc Af Amer: >60 mL/min (ref >60)
GFR calc non Af Amer: >60 mL/min (ref >60)
Glucose, Bld: 117 mg/dL — ABNORMAL HIGH (ref 70–99)
Potassium: 4.1 mmol/L (ref 3.5–5.1)
Sodium: 139 mmol/L (ref 135–145)
Total Bilirubin: 0.8 mg/dL (ref 0.3–1.2)
Total Protein: 6.7 g/dL (ref 6.5–8.1)

## 2020-05-29 LAB — LACTIC ACID, PLASMA
Lactic Acid, Venous: 0.9 mmol/L (ref 0.5–1.9)
Lactic Acid, Venous: 0.8 mmol/L (ref 0.5–1.9)

## 2020-05-29 LAB — D-DIMER, QUANTITATIVE: D-Dimer, Quant: <0.27 ug/mL-FEU (ref 0.00–0.50)

## 2020-05-29 MED ORDER — LACTATED RINGERS IV BOLUS
1000.0000 mL | Freq: Once | INTRAVENOUS | Status: AC
  Administered 2020-05-29: 1000 mL via INTRAVENOUS

## 2020-05-29 NOTE — ED Provider Notes (Signed)
Patient was observed in the emergency department labs unremarkable except for ever so mild elevation in the second troponin.  Patient has been asymptomatic for couple hours.  She prefers to be discharged home.  I discussed the results with the hospitalist and it was decided the patient could be followed up as an outpatient.  She is referred to cardiology   Milton Ferguson, MD 05/29/20 1015

## 2020-05-29 NOTE — ED Triage Notes (Signed)
Pt c/o weakness, nausea, and dizziness since waking up this am.

## 2020-05-29 NOTE — ED Provider Notes (Signed)
[facility] EMERGENCY DEPARTMENT Provider Note   CSN: [id number] Arrival date & time: 05/29/20  0448     History Chief Complaint  Patient presents with  . Weakness    Kristine [name] is a 79 y.o. female.  HPI Patient is not able to clearly tell me what is wrong with her.  She states that she woke up this morning she felt weak She Also rubbing her chest saying that she felt nervous all over.  No significant pain anywhere just did not feel right she got up and around she felt dizzy.  She never had any symptoms like this before.  She is worried about her kidney if she had a transplant 12 years ago.  No recent changes in medications.  No alcohol drugs or tobacco.  No new over-the-counter symptoms.  No recent illnesses.  No urinary symptoms, GI symptoms, respiratory symptoms or other associated symptoms.    Past Medical History:  Diagnosis Date  . Chronic kidney disease    had kidney transplant    Patient Active Problem List   Diagnosis Date Noted  . Tubular adenoma 07/22/2012  . DIARRHEA 12/27/2009  . 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 CHOLECYSTECTOMY    . COLONOSCOPY  08/28/2012   Procedure: COLONOSCOPY;  Surgeon: Rogene Houston, MD;  Location: [facility];  Service: Endoscopy;  Laterality: N/A;  930  . KIDNEY TRANSPLANT    . orif left wrist    . TONSILLECTOMY       OB History   No obstetric history on file.     No family history on file.  Social History   Tobacco Use  . Smoking status: Former Research scientist (life sciences)  . Smokeless tobacco: Never Used  . Tobacco comment: quit in 1977  Substance Use Topics  . Alcohol use: No  . Drug use: No    Home Medications Prior to Admission medications   Medication Sig Start Date End Date Taking? Authorizing Provider  aspirin EC 81 MG tablet Take 81 mg by mouth daily.    [provider]  Cholecalciferol (VITAMIN D) 2000 UNITS CAPS Take 1 capsule by mouth daily.    [provider]  mycophenolate (MYFORTIC) 180 MG EC tablet Take 540 mg by mouth 2 (two) times daily.    [provider]  simvastatin (ZOCOR) 40 MG tablet Take 40 mg by mouth every evening.    [provider]  tacrolimus (PROGRAF) 1 MG capsule Take 1 mg by mouth 2 (two) times daily.    [provider]    Allergies    Patient has no known allergies.  Review of Systems   Review of Systems  All other systems reviewed and are negative.   Physical Exam Updated Vital Signs BP 125/74   Pulse 65   Temp 98 F (36.7 C) (Oral)   Resp 18   Ht 5\' 2"  (1.575 m)   Wt 54.4 kg   SpO2 95%   BMI 21.95 kg/m   Physical Exam Vitals and nursing note reviewed.  Constitutional:      Appearance: She is well-developed.  HENT:     Head: Normocephalic and atraumatic.     Mouth/Throat:     Mouth: Mucous membranes are moist.     Pharynx: Oropharynx is clear.  Eyes:     Pupils: Pupils are equal, round, and reactive to light.  Cardiovascular:     Rate and Rhythm: Normal rate and regular  rhythm.  Pulmonary:     Effort: No respiratory distress.     Breath sounds: No stridor.     Comments: Oxygen as low as 94% at rest, no tachypnea.  Abdominal:     General: Abdomen is flat. There is no distension.  Musculoskeletal:        General: No swelling or tenderness. Normal range of motion.     Cervical back: Normal range of motion.  Skin:    General: Skin is warm and dry.  Neurological:     General: No focal deficit present.     Mental Status: She is alert.     ED Results / Procedures / Treatments   Labs (all labs ordered are listed, but only abnormal results are displayed) Labs Reviewed  CBC WITH DIFFERENTIAL/PLATELET  COMPREHENSIVE METABOLIC PANEL  URINALYSIS, ROUTINE W REFLEX MICROSCOPIC  LACTIC ACID, PLASMA  LACTIC ACID, PLASMA  TROPONIN I (HIGH SENSITIVITY)    EKG None  Radiology CT Head Wo Contrast  Result Date: 05/29/2020 CLINICAL DATA:  Dizziness and  weakness EXAM: CT HEAD WITHOUT CONTRAST TECHNIQUE: Contiguous axial images were obtained from the base of the skull through the vertex without intravenous contrast. COMPARISON:  07/13/2012 FINDINGS: Brain: There is no mass, hemorrhage or extra-axial collection. There is generalized atrophy without lobar predilection. Hypodensity of the white matter is most commonly associated with chronic microvascular disease. Vascular: No abnormal hyperdensity of the major intracranial arteries or dural venous sinuses. No intracranial atherosclerosis. Skull: The visualized skull base, calvarium and extracranial soft tissues are normal. Sinuses/Orbits: No fluid levels or advanced mucosal thickening of the visualized paranasal sinuses. No mastoid or middle ear effusion. The orbits are normal. IMPRESSION: Generalized atrophy and chronic microvascular ischemia without acute intracranial abnormality. Electronically Signed   By: Ulyses Jarred M.D.   On: 05/29/2020 06:41   DG Chest Portable 1 View  Result Date: 05/29/2020 CLINICAL DATA:  Weakness EXAM: PORTABLE CHEST 1 VIEW COMPARISON:  02/27/2014 FINDINGS: The heart size and mediastinal contours are within normal limits. Both lungs are clear. The visualized skeletal structures are unremarkable. IMPRESSION: No active disease. Electronically Signed   By: Ulyses Jarred M.D.   On: 05/29/2020 06:16    Procedures Procedures (including critical care time)  Medications Ordered in ED Medications - No data to display  ED Course  I have reviewed the triage vital signs and the nursing notes.  Pertinent labs & imaging results that were available during my care of the patient were reviewed by me and considered in my medical decision making (see chart for details).    MDM Rules/Calculators/A&P                          eval for possible etiologies for her nonspecific symptoms.   ecg with [id number] and RAD. This with mild hypoxia and questionable chest symptoms, will add on a d dimer  to screen for PE.   Care transferred pending reeval and fu labs. Likely dc if all ok.   Final Clinical Impression(s) / ED Diagnoses Final diagnoses:  None    Rx / DC Orders ED Discharge Orders    None       Franci Oshana, Corene Cornea, MD 05/30/20 [phone]

## 2020-05-29 NOTE — Discharge Instructions (Addendum)
Call next week and make an appointment to see Dr. Harl Bowie or one of his partners.  Return if any problems and follow-up with your family doctor if you are unable to be seen by the cardiologist in the next week

## 2020-06-14 ENCOUNTER — Telehealth (INDEPENDENT_AMBULATORY_CARE_PROVIDER_SITE_OTHER): Payer: Self-pay | Admitting: *Deleted

## 2020-06-14 NOTE — Telephone Encounter (Signed)
Referring MD/PCP: fagan   Procedure: tcs  Reason/Indication:  Hx polyps  Has patient had this procedure before?  Yes, 2013  If so, when, by whom and where?    Is there a family history of colon cancer?  no  Who?  What age when diagnosed?    Is patient diabetic?   no      Does patient have prosthetic heart valve or mechanical valve?  no  Do you have a pacemaker/defibrillator?  no  Has patient ever had endocarditis/atrial fibrillation? no  Does patient use oxygen? no  Has patient had joint replacement within last 12 months?  no  Is patient constipated or do they take laxatives? no  Does patient have a history of alcohol/drug use?  no  Is patient on blood thinner such as Coumadin, Plavix and/or Aspirin? yes  Medications: asa 81 mg daily, simvastatin 20 mg daily, solifenacin 10 mg daily, tacrolimus daily, mycophenolie 180 mg 1 tabs daily, vit d3 daily, amlodipine 25 mg daily  Allergies: nkda  Medication Adjustment per Dr Rehman/Dr Jenetta Downer asa 2 days  Procedure date & time: 07/14/20

## 2020-07-07 NOTE — Patient Instructions (Signed)
Kristine [name]  07/07/2020     @PREFPERIOPPHARMACY @   Your procedure is scheduled on Wednesday, 07/14/20.  Report to [facility] at 0800 A.M.  Call this number if you have problems the morning of surgery:  [phone]   Remember:  Do not eat or drink after midnight.     Take these medicines the morning of surgery with A SIP OF WATER amlodipine, myfortic, prograf    Do not wear jewelry, make-up or nail polish.  Do not wear lotions, powders, or perfumes, or deodorant.  Do not shave 48 hours prior to surgery.  Men may shave face and neck.  Do not bring valuables to the hospital.  [facility] is not responsible for any belongings or valuables.  Contacts, dentures or bridgework may not be worn into surgery.  Leave your suitcase in the car.  After surgery it may be brought to your room.  For patients admitted to the hospital, discharge time will be determined by your treatment team.  Patients discharged the day of surgery will not be allowed to drive home.   Name and phone number of your driver:   family Special instructions:  Please follow specific instructions given to you by Dr. Olevia Perches office.  Please read over the following fact sheets that you were given. Anesthesia Post-op Instructions and Care and Recovery After Surgery      Colonoscopy, Adult, Care After This sheet gives you information about how to care for yourself after your procedure. Your health care provider may also give you more specific instructions. If you have problems or questions, contact your health care provider. What can I expect after the procedure? After the procedure, it is common to have:  A small amount of blood in your stool for 24 hours after the procedure.  Some gas.  Mild cramping or bloating of your abdomen. Follow these instructions at home: Eating and drinking   Drink enough fluid to keep your urine pale yellow.  Follow instructions from your health care provider about eating or  drinking restrictions.  Resume your normal diet as instructed by your health care provider. Avoid heavy or fried foods that are hard to digest. Activity  Rest as told by your health care provider.  Avoid sitting for a long time without moving. Get up to take short walks every 1-2 hours. This is important to improve blood flow and breathing. Ask for help if you feel weak or unsteady.  Return to your normal activities as told by your health care provider. Ask your health care provider what activities are safe for you. Managing cramping and bloating   Try walking around when you have cramps or feel bloated.  Apply heat to your abdomen as told by your health care provider. Use the heat source that your health care provider recommends, such as a moist heat pack or a heating pad. ? Place a towel between your skin and the heat source. ? Leave the heat on for 20-30 minutes. ? Remove the heat if your skin turns bright red. This is especially important if you are unable to feel pain, heat, or cold. You may have a greater risk of getting burned. General instructions  For the first 24 hours after the procedure: ? Do not drive or use machinery. ? Do not sign important documents. ? Do not drink alcohol. ? Do your regular daily activities at a slower pace than normal. ? Eat soft foods that are easy to digest.  Take over-the-counter and  prescription medicines only as told by your health care provider.  Keep all follow-up visits as told by your health care provider. This is important. Contact a health care provider if:  You have blood in your stool 2-3 days after the procedure. Get help right away if you have:  More than a small spotting of blood in your stool.  Large blood clots in your stool.  Swelling of your abdomen.  Nausea or vomiting.  A fever.  Increasing pain in your abdomen that is not relieved with medicine. Summary  After the procedure, it is common to have a small amount  of blood in your stool. You may also have mild cramping and bloating of your abdomen.  For the first 24 hours after the procedure, do not drive or use machinery, sign important documents, or drink alcohol.  Get help right away if you have a lot of blood in your stool, nausea or vomiting, a fever, or increased pain in your abdomen. This information is not intended to replace advice given to you by your health care provider. Make sure you discuss any questions you have with your health care provider. Document Revised: 05/05/2019 Document Reviewed: 05/05/2019 Elsevier Patient Education  [place]. Colonoscopy, Adult A colonoscopy is a procedure to look at the entire large intestine. This procedure is done using a long, thin, flexible tube that has a camera on the end. You may have a colonoscopy:  As a part of normal colorectal screening.  If you have certain symptoms, such as: ? A low number of red blood cells in your blood (anemia). ? Diarrhea that does not go away. ? Pain in your abdomen. ? Blood in your stool. A colonoscopy can help screen for and diagnose medical problems, including:  Tumors.  Extra tissue that grows where mucus forms (polyps).  Inflammation.  Areas of bleeding. Tell your health care provider about:  Any allergies you have.  All medicines you are taking, including vitamins, herbs, eye drops, creams, and over-the-counter medicines.  Any problems you or family members have had with anesthetic medicines.  Any blood disorders you have.  Any surgeries you have had.  Any medical conditions you have.  Any problems you have had with having bowel movements.  Whether you are pregnant or may be pregnant. What are the risks? Generally, this is a safe procedure. However, problems may occur, including:  Bleeding.  Damage to your intestine.  Allergic reactions to medicines given during the procedure.  Infection. This is rare. What happens before the  procedure? Eating and drinking restrictions Follow instructions from your health care provider about eating or drinking restrictions, which may include:  A few days before the procedure: ? Follow a low-fiber diet. ? Avoid nuts, seeds, dried fruit, raw fruits, and vegetables.  1-3 days before the procedure: ? Eat only gelatin dessert or ice pops. ? Drink only clear liquids, such as water, clear juice, clear broth or bouillon, black coffee or tea, or clear soft drinks or sports drinks. ? Avoid liquids that contain red or purple dye.  The day of the procedure: ? Do not eat solid foods. You may continue to drink clear liquids until up to 2 hours before the procedure. ? Do not eat or drink anything starting 2 hours before the procedure, or within the time period that your health care provider recommends. Bowel prep If you were prescribed a bowel prep to take by mouth (orally) to clean out your colon:  Take it as  told by your health care provider. Starting the day before your procedure, you will need to drink a large amount of liquid medicine. The liquid will cause you to have many bowel movements of loose stool until your stool becomes almost clear or light green.  If your skin or the opening between the buttocks (anus) gets irritated from diarrhea, you may relieve the irritation using: ? Wipes with medicine in them, such as adult wet wipes with aloe and vitamin E. ? A product to soothe skin, such as petroleum jelly.  If you vomit while drinking the bowel prep: ? Take a break for up to 60 minutes. ? Begin the bowel prep again. ? Call your health care provider if you keep vomiting or you cannot take the bowel prep without vomiting.  To clean out your colon, you may also be given: ? Laxative medicines. These help you have a bowel movement. ? Instructions for enema use. An enema is liquid medicine injected into your rectum. Medicines Ask your health care provider about:  Changing or  stopping your regular medicines or supplements. This is especially important if you are taking iron supplements, diabetes medicines, or blood thinners.  Taking medicines such as aspirin and ibuprofen. These medicines can thin your blood. Do not take these medicines unless your health care provider tells you to take them.  Taking over-the-counter medicines, vitamins, herbs, and supplements. General instructions  Ask your health care provider what steps will be taken to help prevent infection. These may include washing skin with a germ-killing soap.  Plan to have someone take you home from the hospital or clinic. What happens during the procedure?   An IV will be inserted into one of your veins.  You may be given one or more of the following: ? A medicine to help you relax (sedative). ? A medicine to numb the area (local anesthetic). ? A medicine to make you fall asleep (general anesthetic). This is rarely needed.  You will lie on your side with your knees bent.  The tube will: ? Have oil or gel put on it (be lubricated). ? Be inserted into your anus. ? Be gently eased through all parts of your large intestine.  Air will be sent into your colon to keep it open. This may cause some pressure or cramping.  Images will be taken with the camera and will appear on a screen.  A small tissue sample may be removed to be looked at under a microscope (biopsy). The tissue may be sent to a lab for testing if any signs of problems are found.  If small polyps are found, they may be removed and checked for cancer cells.  When the procedure is finished, the tube will be removed. The procedure may vary among health care providers and hospitals. What happens after the procedure?  Your blood pressure, heart rate, breathing rate, and blood oxygen level will be monitored until you leave the hospital or clinic.  You may have a small amount of blood in your stool.  You may pass gas and have mild  cramping or bloating in your abdomen. This is caused by the air that was used to open your colon during the exam.  Do not drive for 24 hours after the procedure.  It is up to you to get the results of your procedure. Ask your health care provider, or the department that is doing the procedure, when your results will be ready. Summary  A colonoscopy is a procedure  to look at the entire large intestine.  Follow instructions from your health care provider about eating and drinking before the procedure.  If you were prescribed an oral bowel prep to clean out your colon, take it as told by your health care provider.  During the colonoscopy, a flexible tube with a camera on its end is inserted into the anus and then passed into the other parts of the large intestine. This information is not intended to replace advice given to you by your health care provider. Make sure you discuss any questions you have with your health care provider. Document Revised: 05/02/2019 Document Reviewed: 05/02/2019 Elsevier Patient Education  2020 [place] Anesthesia is a term that refers to techniques, procedures, and medicines that help a person stay safe and comfortable during a medical procedure. Monitored anesthesia care, or sedation, is one type of anesthesia. Your anesthesia specialist may recommend sedation if you will be having a procedure that does not require you to be unconscious, such as:  Cataract surgery.  A dental procedure.  A biopsy.  A colonoscopy. During the procedure, you may receive a medicine to help you relax (sedative). There are three levels of sedation:  Mild sedation. At this level, you may feel awake and relaxed. You will be able to follow directions.  Moderate sedation. At this level, you will be sleepy. You may not remember the procedure.  Deep sedation. At this level, you will be asleep. You will not remember the procedure. The more medicine you  are given, the deeper your level of sedation will be. Depending on how you respond to the procedure, the anesthesia specialist may change your level of sedation or the type of anesthesia to fit your needs. An anesthesia specialist will monitor you closely during the procedure. Let your health care provider know about:  Any allergies you have.  All medicines you are taking, including vitamins, herbs, eye drops, creams, and over-the-counter medicines.  Any use of steroids (by mouth or as a cream).  Any problems you or family members have had with sedatives and anesthetic medicines.  Any blood disorders you have.  Any surgeries you have had.  Any medical conditions you have, such as sleep apnea.  Whether you are pregnant or may be pregnant.  Any use of cigarettes, alcohol, or street drugs. What are the risks? Generally, this is a safe procedure. However, problems may occur, including:  Getting too much medicine (oversedation).  Nausea.  Allergic reaction to medicines.  Trouble breathing. If this happens, a breathing tube may be used to help with breathing. It will be removed when you are awake and breathing on your own.  Heart trouble.  Lung trouble. Before the procedure Staying hydrated Follow instructions from your health care provider about hydration, which may include:  Up to 2 hours before the procedure - you may continue to drink clear liquids, such as water, clear fruit juice, black coffee, and plain tea. Eating and drinking restrictions Follow instructions from your health care provider about eating and drinking, which may include:  8 hours before the procedure - stop eating heavy meals or foods such as meat, fried foods, or fatty foods.  6 hours before the procedure - stop eating light meals or foods, such as toast or cereal.  6 hours before the procedure - stop drinking milk or drinks that contain milk.  2 hours before the procedure - stop drinking clear  liquids. Medicines Ask your health care provider about:  Changing or stopping your regular medicines. This is especially important if you are taking diabetes medicines or blood thinners.  Taking medicines such as aspirin and ibuprofen. These medicines can thin your blood. Do not take these medicines before your procedure if your health care provider instructs you not to. Tests and exams  You will have a physical exam.  You may have blood tests done to show: ? How well your kidneys and liver are working. ? How well your blood can clot. General instructions  Plan to have someone take you home from the hospital or clinic.  If you will be going home right after the procedure, plan to have someone with you for 24 hours.  What happens during the procedure?  Your blood pressure, heart rate, breathing, level of pain and overall condition will be monitored.  An IV tube will be inserted into one of your veins.  Your anesthesia specialist will give you medicines as needed to keep you comfortable during the procedure. This may mean changing the level of sedation.  The procedure will be performed. After the procedure  Your blood pressure, heart rate, breathing rate, and blood oxygen level will be monitored until the medicines you were given have worn off.  Do not drive for 24 hours if you received a sedative.  You may: ? Feel sleepy, clumsy, or nauseous. ? Feel forgetful about what happened after the procedure. ? Have a sore throat if you had a breathing tube during the procedure. ? Vomit. This information is not intended to replace advice given to you by your health care provider. Make sure you discuss any questions you have with your health care provider. Document Revised: 09/21/2017 Document Reviewed: 01/30/2016 Elsevier Patient Education  [place].

## 2020-07-12 ENCOUNTER — Other Ambulatory Visit: Payer: Self-pay

## 2020-07-12 ENCOUNTER — Other Ambulatory Visit (HOSPITAL_COMMUNITY)
Admission: RE | Admit: 2020-07-12 | Discharge: 2020-07-12 | Disposition: A | Discharge status: Home / Self Care | Payer: Medicare HMO | Source: Ambulatory Visit | Attending: Internal Medicine | Admitting: Internal Medicine

## 2020-07-12 ENCOUNTER — Encounter (HOSPITAL_COMMUNITY)
Admission: RE | Admit: 2020-07-12 | Discharge: 2020-07-12 | Disposition: A | Discharge status: Home / Self Care | Payer: Medicare HMO | Source: Ambulatory Visit | Attending: Internal Medicine | Admitting: Internal Medicine

## 2020-07-12 DIAGNOSIS — Z01812 Encounter for preprocedural laboratory examination: Secondary | ICD-10-CM | POA: Insufficient documentation

## 2020-07-12 DIAGNOSIS — Z20822 Contact with and (suspected) exposure to covid-19: Secondary | ICD-10-CM | POA: Insufficient documentation

## 2020-07-12 LAB — SARS CORONAVIRUS 2 (TAT 6-24 HRS): SARS Coronavirus 2: NEGATIVE

## 2020-07-14 ENCOUNTER — Encounter (HOSPITAL_COMMUNITY): Payer: Self-pay | Admitting: Internal Medicine

## 2020-07-14 ENCOUNTER — Ambulatory Visit (HOSPITAL_COMMUNITY)
Admission: RE | Admit: 2020-07-14 | Discharge: 2020-07-14 | Disposition: A | Discharge status: Home / Self Care | Payer: Medicare HMO | Attending: Internal Medicine | Admitting: Internal Medicine

## 2020-07-14 ENCOUNTER — Ambulatory Visit (HOSPITAL_COMMUNITY): Payer: Medicare HMO | Admitting: Anesthesiology

## 2020-07-14 ENCOUNTER — Encounter (HOSPITAL_COMMUNITY)
Admission: RE | Disposition: A | Discharge status: Home / Self Care | Payer: Self-pay | Source: Home / Self Care | Attending: Internal Medicine

## 2020-07-14 DIAGNOSIS — Q438 Other specified congenital malformations of intestine: Secondary | ICD-10-CM | POA: Insufficient documentation

## 2020-07-14 DIAGNOSIS — Z1211 Encounter for screening for malignant neoplasm of colon: Secondary | ICD-10-CM | POA: Insufficient documentation

## 2020-07-14 DIAGNOSIS — K644 Residual hemorrhoidal skin tags: Secondary | ICD-10-CM | POA: Diagnosis not present

## 2020-07-14 DIAGNOSIS — Z87891 Personal history of nicotine dependence: Secondary | ICD-10-CM | POA: Diagnosis not present

## 2020-07-14 DIAGNOSIS — K6389 Other specified diseases of intestine: Secondary | ICD-10-CM | POA: Diagnosis not present

## 2020-07-14 DIAGNOSIS — Z86010 Personal history of colonic polyps: Secondary | ICD-10-CM | POA: Insufficient documentation

## 2020-07-14 DIAGNOSIS — K5730 Diverticulosis of large intestine without perforation or abscess without bleeding: Secondary | ICD-10-CM | POA: Insufficient documentation

## 2020-07-14 DIAGNOSIS — Z940 Kidney transplant status: Secondary | ICD-10-CM | POA: Diagnosis not present

## 2020-07-14 DIAGNOSIS — Z09 Encounter for follow-up examination after completed treatment for conditions other than malignant neoplasm: Secondary | ICD-10-CM

## 2020-07-14 DIAGNOSIS — K6289 Other specified diseases of anus and rectum: Secondary | ICD-10-CM

## 2020-07-14 HISTORY — PX: COLONOSCOPY WITH PROPOFOL: SHX5780

## 2020-07-14 SURGERY — COLONOSCOPY WITH PROPOFOL
Anesthesia: General

## 2020-07-14 MED ORDER — CHLORHEXIDINE GLUCONATE CLOTH 2 % EX PADS: 6.0000 | MEDICATED_PAD | Freq: Once | CUTANEOUS | Status: DC

## 2020-07-14 MED ORDER — STERILE WATER FOR IRRIGATION IR SOLN
Status: DC | PRN
  Administered 2020-07-14: 2.5 mL

## 2020-07-14 MED ORDER — PROPOFOL 10 MG/ML IV BOLUS
INTRAVENOUS | Status: DC | PRN
  Administered 2020-07-14: 50 mg via INTRAVENOUS

## 2020-07-14 MED ORDER — GLYCOPYRROLATE 0.2 MG/ML IJ SOLN
INTRAMUSCULAR | Status: DC | PRN
  Administered 2020-07-14: .2 mg via INTRAVENOUS

## 2020-07-14 MED ORDER — PROPOFOL 500 MG/50ML IV EMUL
INTRAVENOUS | Status: DC | PRN
  Administered 2020-07-14: 100 ug/kg/min via INTRAVENOUS

## 2020-07-14 MED ORDER — LACTATED RINGERS IV SOLN
Freq: Once | INTRAVENOUS | Status: AC
  Administered 2020-07-14: 1000 mL via INTRAVENOUS

## 2020-07-14 MED ORDER — LACTATED RINGERS IV SOLN: INTRAVENOUS | Status: DC | PRN

## 2020-07-14 NOTE — Anesthesia Preprocedure Evaluation (Signed)
Anesthesia Evaluation  Patient identified by MRN, date of birth, ID band Patient awake    Reviewed: Allergy & Precautions, NPO status , Patient's Chart, lab work & pertinent test results  History of Anesthesia Complications Negative for: history of anesthetic complications  Airway Mallampati: II  TM Distance: >3 FB Neck ROM: Full    Dental  (+) Dental Advisory Given, Missing, Caps   Pulmonary former smoker,    Pulmonary exam normal breath sounds clear to auscultation       Cardiovascular Exercise Tolerance: Good Normal cardiovascular exam Rhythm:Regular Rate:Normal - Systolic murmurs, - Diastolic murmurs, - Friction Rub, - Carotid Bruit, - Peripheral Edema and - Systolic Click    Neuro/Psych negative neurological ROS  negative psych ROS   GI/Hepatic negative GI ROS, Neg liver ROS,   Endo/Other  negative endocrine ROS  Renal/GU Renal InsufficiencyRenal disease (kidney transplant)  negative genitourinary   Musculoskeletal negative musculoskeletal ROS (+)   Abdominal   Peds  Hematology negative hematology ROS (+)   Anesthesia Other Findings   Reproductive/Obstetrics negative OB ROS                            Anesthesia Physical Anesthesia Plan  ASA: III  Anesthesia Plan: General   Post-op Pain Management:    Induction: Intravenous  PONV Risk Score and Plan: TIVA  Airway Management Planned: Nasal Cannula and Natural Airway  Additional Equipment:   Intra-op Plan:   Post-operative Plan:   Informed Consent: I have reviewed the patients History and Physical, chart, labs and discussed the procedure including the risks, benefits and alternatives for the proposed anesthesia with the patient or authorized representative who has indicated his/her understanding and acceptance.     Dental advisory given  Plan Discussed with: CRNA and Surgeon  Anesthesia Plan Comments:         Anesthesia Quick Evaluation

## 2020-07-14 NOTE — Discharge Instructions (Signed)
Resume usual medications including aspirin as before. High-fiber diet. No driving for 24 hours.  Colonoscopy, Adult, Care After This sheet gives you information about how to care for yourself after your procedure. Your health care provider may also give you more specific instructions. If you have problems or questions, contact your health care provider. What can I expect after the procedure? After the procedure, it is common to have:  A small amount of blood in your stool for 24 hours after the procedure.  Some gas.  Mild cramping or bloating of your abdomen. Follow these instructions at home: Eating and drinking   Drink enough fluid to keep your urine pale yellow.  Follow instructions from your health care provider about eating or drinking restrictions.  Resume your normal diet as instructed by your health care provider. Avoid heavy or fried foods that are hard to digest. Activity  Rest as told by your health care provider.  Avoid sitting for a long time without moving. Get up to take short walks every 1-2 hours. This is important to improve blood flow and breathing. Ask for help if you feel weak or unsteady.  Return to your normal activities as told by your health care provider. Ask your health care provider what activities are safe for you. Managing cramping and bloating   Try walking around when you have cramps or feel bloated.  Apply heat to your abdomen as told by your health care provider. Use the heat source that your health care provider recommends, such as a moist heat pack or a heating pad. ? Place a towel between your skin and the heat source. ? Leave the heat on for 20-30 minutes. ? Remove the heat if your skin turns bright red. This is especially important if you are unable to feel pain, heat, or cold. You may have a greater risk of getting burned. General instructions  For the first 24 hours after the procedure: ? Do not drive or use machinery. ? Do not sign  important documents. ? Do not drink alcohol. ? Do your regular daily activities at a slower pace than normal. ? Eat soft foods that are easy to digest.  Take over-the-counter and prescription medicines only as told by your health care provider.  Keep all follow-up visits as told by your health care provider. This is important. Contact a health care provider if:  You have blood in your stool 2-3 days after the procedure. Get help right away if you have:  More than a small spotting of blood in your stool.  Large blood clots in your stool.  Swelling of your abdomen.  Nausea or vomiting.  A fever.  Increasing pain in your abdomen that is not relieved with medicine. Summary  After the procedure, it is common to have a small amount of blood in your stool. You may also have mild cramping and bloating of your abdomen.  For the first 24 hours after the procedure, do not drive or use machinery, sign important documents, or drink alcohol.  Get help right away if you have a lot of blood in your stool, nausea or vomiting, a fever, or increased pain in your abdomen. This information is not intended to replace advice given to you by your health care provider. Make sure you discuss any questions you have with your health care provider. Document Revised: 05/05/2019 Document Reviewed: 05/05/2019 Elsevier Patient Education  [place].  High-Fiber Diet Fiber, also called dietary fiber, is a type of carbohydrate that  is found in fruits, vegetables, whole grains, and beans. A high-fiber diet can have many health benefits. Your health care provider may recommend a high-fiber diet to help:  Prevent constipation. Fiber can make your bowel movements more regular.  Lower your cholesterol.  Relieve the following conditions: ? Swelling of veins in the anus (hemorrhoids). ? Swelling and irritation (inflammation) of specific areas of the digestive tract (uncomplicated diverticulosis). ? A  problem of the large intestine (colon) that sometimes causes pain and diarrhea (irritable bowel syndrome, IBS).  Prevent overeating as part of a weight-loss plan.  Prevent heart disease, type 2 diabetes, and certain cancers. What is my plan? The recommended daily fiber intake in grams (g) includes:  38 g for men age 79 or younger.  30 g for men over age 83.  58 g for women age 41 or younger.  21 g for women over age 17. You can get the recommended daily intake of dietary fiber by:  Eating a variety of fruits, vegetables, grains, and beans.  Taking a fiber supplement, if it is not possible to get enough fiber through your diet. What do I need to know about a high-fiber diet?  It is better to get fiber through food sources rather than from fiber supplements. There is not a lot of research about how effective supplements are.  Always check the fiber content on the nutrition facts label of any prepackaged food. Look for foods that contain 5 g of fiber or more per serving.  Talk with a diet and nutrition specialist (dietitian) if you have questions about specific foods that are recommended or not recommended for your medical condition, especially if those foods are not listed below.  Gradually increase how much fiber you consume. If you increase your intake of dietary fiber too quickly, you may have bloating, cramping, or gas.  Drink plenty of water. Water helps you to digest fiber. What are tips for following this plan?  Eat a wide variety of high-fiber foods.  Make sure that half of the grains that you eat each day are whole grains.  Eat breads and cereals that are made with whole-grain flour instead of refined flour or white flour.  Eat brown rice, bulgur wheat, or millet instead of white rice.  Start the day with a breakfast that is high in fiber, such as a cereal that contains 5 g of fiber or more per serving.  Use beans in place of meat in soups, salads, and pasta  dishes.  Eat high-fiber snacks, such as berries, raw vegetables, nuts, and popcorn.  Choose whole fruits and vegetables instead of processed forms like juice or sauce. What foods can I eat?  Fruits Berries. Pears. Apples. Oranges. Avocado. Prunes and raisins. Dried figs. Vegetables Sweet potatoes. Spinach. Kale. Artichokes. Cabbage. Broccoli. Cauliflower. Green peas. Carrots. Squash. Grains Whole-grain breads. Multigrain cereal. Oats and oatmeal. Brown rice. Barley. Bulgur wheat. [place]. Quinoa. Bran muffins. Popcorn. Rye wafer crackers. Meats and other proteins [place], kidney, and pinto beans. Soybeans. Split peas. Lentils. Nuts and seeds. Dairy Fiber-fortified yogurt. Beverages Fiber-fortified soy milk. Fiber-fortified orange juice. Other foods Fiber bars. The items listed above may not be a complete list of recommended foods and beverages. Contact a dietitian for more options. What foods are not recommended? Fruits Fruit juice. Cooked, strained fruit. Vegetables Fried potatoes. Canned vegetables. Well-cooked vegetables. Grains White bread. Pasta made with refined flour. White rice. Meats and other proteins Fatty cuts of meat. Fried chicken or fried fish. Dairy Milk.  Yogurt. Cream cheese. Sour cream. Fats and oils Butters. Beverages Soft drinks. Other foods Cakes and pastries. The items listed above may not be a complete list of foods and beverages to avoid. Contact a dietitian for more information. Summary  Fiber is a type of carbohydrate. It is found in fruits, vegetables, whole grains, and beans.  There are many health benefits of eating a high-fiber diet, such as preventing constipation, lowering blood cholesterol, helping with weight loss, and reducing your risk of heart disease, diabetes, and certain cancers.  Gradually increase your intake of fiber. Increasing too fast can result in cramping, bloating, and gas. Drink plenty of water while you increase your  fiber.  The best sources of fiber include whole fruits and vegetables, whole grains, nuts, seeds, and beans. This information is not intended to replace advice given to you by your health care provider. Make sure you discuss any questions you have with your health care provider. Document Revised: 08/13/2017 Document Reviewed: 08/13/2017 Elsevier Patient Education  2020 [employer].

## 2020-07-14 NOTE — Transfer of Care (Signed)
Immediate Anesthesia Transfer of Care Note  Patient: Kristine [name]  Procedure(s) Performed: COLONOSCOPY WITH PROPOFOL (N/A )  Patient Location: PACU  Anesthesia Type:General  Level of Consciousness: awake, alert , oriented and patient cooperative  Airway & Oxygen Therapy: Patient Spontanous Breathing  Post-op Assessment: Report given to RN, Post -op Vital signs reviewed and stable and Patient moving all extremities X 4  Post vital signs: Reviewed and stable  Last Vitals:  Vitals Value Taken Time  BP 125/67 07/14/20 1013  Temp    Pulse 51 07/14/20 1014  Resp 16 07/14/20 1014  SpO2 85 % 07/14/20 1014  Vitals shown include unvalidated device data.  Last Pain:  Vitals:   07/14/20 0824  TempSrc: Oral  PainSc: 0-No pain      Patients Stated Pain Goal: 8 (06/00/45 9977)  Complications: No complications documented.

## 2020-07-14 NOTE — Anesthesia Postprocedure Evaluation (Signed)
Anesthesia Post Note  Patient: Kristine [name]  Procedure(s) Performed: COLONOSCOPY WITH PROPOFOL (N/A )  Patient location during evaluation: PACU Anesthesia Type: General Level of consciousness: awake, oriented, awake and alert and patient cooperative Pain management: satisfactory to patient Vital Signs Assessment: post-procedure vital signs reviewed and stable Respiratory status: spontaneous breathing, respiratory function stable and nonlabored ventilation Cardiovascular status: stable Postop Assessment: no apparent nausea or vomiting Anesthetic complications: no   No complications documented.   Last Vitals:  Vitals:   07/14/20 0824  BP: 131/69  Pulse: 66  Resp: 15  Temp: 36.8 C  SpO2: 100%    Last Pain:  Vitals:   07/14/20 0824  TempSrc: Oral  PainSc: 0-No pain                 Gatha Mcnulty

## 2020-07-14 NOTE — H&P (Signed)
Kristine [name] is an 79 y.o. female.   Chief Complaint: Patient is here for colonoscopy. HPI: Patient is 79 year old Caucasian female who has history of colonic adenomas and is here for surveillance colonoscopy.  She denies abdominal pain change in bowel habits or rectal bleeding. Last colonoscopy was in November 2013. Last aspirin dose was 3 days ago. Family history is negative for CRC.  Past Medical History:  Diagnosis Date  . Chronic kidney disease    had kidney transplant    Past Surgical History:  Procedure Laterality Date  . CHOLECYSTECTOMY    . COLONOSCOPY  08/28/2012   Procedure: COLONOSCOPY;  Surgeon: Rogene Houston, MD;  Location: [facility];  Service: Endoscopy;  Laterality: N/A;  930  . KIDNEY TRANSPLANT    . orif left wrist    . TONSILLECTOMY      History reviewed. No pertinent family history. Social History:  reports that she has quit smoking. She has never used smokeless tobacco. She reports that she does not drink alcohol and does not use drugs.  Allergies: No Known Allergies  Medications Prior to Admission  Medication Sig Dispense Refill  . amLODipine (NORVASC) 2.5 MG tablet Take 2.5 mg by mouth at bedtime.     Marland Kitchen aspirin EC 81 MG tablet Take 81 mg by mouth daily.    . Cholecalciferol (VITAMIN D) 2000 UNITS CAPS Take 2,000 Units by mouth daily.     . mycophenolate (MYFORTIC) 180 MG EC tablet Take 540 mg by mouth 2 (two) times daily.    . simvastatin (ZOCOR) 20 MG tablet Take 20 mg by mouth at bedtime.     . solifenacin (VESICARE) 10 MG tablet Take 10 mg by mouth daily.    . tacrolimus (PROGRAF) 0.5 MG capsule Take 0.5 mg by mouth 2 (two) times daily.       Results for orders placed or performed during the hospital encounter of 07/12/20 (from the past 48 hour(s))  SARS CORONAVIRUS 2 (TAT 6-24 HRS) Nasopharyngeal Nasopharyngeal Swab     Status: None   Collection Time: 07/12/20 11:01 AM   Specimen: Nasopharyngeal Swab  Result Value Ref Range   SARS  Coronavirus 2 NEGATIVE NEGATIVE    Comment: (NOTE) SARS-CoV-2 target nucleic acids are NOT DETECTED.  The SARS-CoV-2 RNA is generally detectable in upper and lower respiratory specimens during the acute phase of infection. Negative results do not preclude SARS-CoV-2 infection, do not rule out co-infections with other pathogens, and should not be used as the sole basis for treatment or other patient management decisions. Negative results must be combined with clinical observations, patient history, and epidemiological information. The expected result is Negative.  Fact Sheet for Patients: SugarRoll.be  Fact Sheet for Healthcare Providers: https://www.woods-mathews.com/  This test is not yet approved or cleared by the [place] FDA and  has been authorized for detection and/or diagnosis of SARS-CoV-2 by FDA under an Emergency Use Authorization (EUA). This EUA will remain  in effect (meaning this test can be used) for the duration of the COVID-19 declaration under Se ction 564(b)(1) of the Act, 21 U.S.C. section 360bbb-3(b)(1), unless the authorization is terminated or revoked sooner.  Performed at [facility], [place] [address] [place], [place] [address]    No results found.  Review of Systems  Blood pressure 131/69, pulse 66, temperature 98.2 F (36.8 C), temperature source Oral, resp. rate 15, SpO2 100 %. Physical Exam HENT:     Mouth/Throat:     Mouth: Mucous  membranes are moist.     Pharynx: Oropharynx is clear.  Eyes:     General: No scleral icterus.    Conjunctiva/sclera: Conjunctivae normal.  Cardiovascular:     Rate and Rhythm: Normal rate and regular rhythm.     Heart sounds: Normal heart sounds. No murmur heard.   Pulmonary:     Effort: Pulmonary effort is normal.     Breath sounds: Normal breath sounds.  Abdominal:     Comments: Abdomen is flat.  Transplanted kidney is palpated in left lower quadrant.   Abdomen is soft and nontender with organomegaly or masses.  Musculoskeletal:        General: No swelling.     Cervical back: Neck supple.  Lymphadenopathy:     Cervical: No cervical adenopathy.  Skin:    General: Skin is warm and dry.  Neurological:     Mental Status: She is alert.      Assessment/Plan History of colonic adenomas. Surveillance colonoscopy.  Hildred Laser, MD 07/14/2020, 9:28 AM

## 2020-07-14 NOTE — Op Note (Signed)
[facility] Patient Name: Kristine [name] Procedure Date: 07/14/2020 9:14 AM MRN: [id number] Date of Birth: 09/09/1941 Attending MD: Hildred Laser , MD CSN: [id number] Age: 79 Admit Type: Outpatient Procedure:                Colonoscopy Indications:              High risk colon cancer surveillance: Personal                            history of colonic polyps Providers:                Hildred Laser, MD, Lurline Del, RN, Kristine L.                            Risa Grill, Technician, Randa Spike, Merchant [employer] officer Referring MD:             Asencion Noble, MD Medicines:                Propofol per Anesthesia Complications:            No immediate complications. Estimated Blood Loss:     Estimated blood loss: none. Procedure:                Pre-Anesthesia Assessment:                           - Prior to the procedure, a History and Physical                            was performed, and patient medications and                            allergies were reviewed. The patient's tolerance of                            previous anesthesia was also reviewed. The risks                            and benefits of the procedure and the sedation                            options and risks were discussed with the patient.                            All questions were answered, and informed consent                            was obtained. Prior Anticoagulants: The patient has                            taken no previous anticoagulant or antiplatelet                            agents except for aspirin. ASA Grade Assessment:  III - A patient with severe systemic disease. After                            reviewing the risks and benefits, the patient was                            deemed in satisfactory condition to undergo the                            procedure.                           After obtaining informed consent, the colonoscope                            was passed under  direct vision. Throughout the                            procedure, the patient's blood pressure, pulse, and                            oxygen saturations were monitored continuously. The                            [id number]-[id number] ([id number]) scope was introduced through                            the anus and advanced to the the cecum, identified                            by appendiceal orifice and ileocecal valve. The                            colonoscopy was technically difficult and complex                            due to a redundant colon and significant looping.                            Successful completion of the procedure was aided by                            withdrawing the scope and replacing with the                            'babyscope'. The patient tolerated the procedure                            well. The quality of the bowel preparation was                            good. The ileocecal valve, appendiceal orifice, and  rectum were photographed. Scope In: 9:38:50 AM Scope Out: 10:06:49 AM Scope Withdrawal Time: 0 hours 7 minutes 7 seconds  Total Procedure Duration: 0 hours 27 minutes 59 seconds  Findings:      Skin tags were found on perianal exam.      A diffuse area of mild melanosis was found in the entire colon.      Scattered small-mouthed diverticula were found in the sigmoid colon.      External hemorrhoids were found during retroflexion. The hemorrhoids       were small. Impression:               - Perianal skin tags found on perianal exam.                           - Melanosis in the colon.                           - Diverticulosis in the sigmoid colon.                           - External hemorrhoids.                           - No specimens collected. Moderate Sedation:      Per Anesthesia Care Recommendation:           - Patient has a contact number available for                            emergencies. The signs and symptoms of  potential                            delayed complications were discussed with the                            patient. Return to normal activities tomorrow.                            Written discharge instructions were provided to the                            patient.                           - High fiber diet today.                           - Continue present medications.                           - No repeat colonoscopy due to age and the absence                            of advanced adenomas. Procedure Code(s):        --- Professional ---                           [phone], Colonoscopy,  flexible; diagnostic, including                            collection of specimen(s) by brushing or washing,                            when performed (separate procedure) Diagnosis Code(s):        --- Professional ---                           K64.4, Residual hemorrhoidal skin tags                           Z86.010, Personal history of colonic polyps                           K63.89, Other specified diseases of intestine                           K57.30, Diverticulosis of large intestine without                            perforation or abscess without bleeding CPT copyright 2019 American Medical Association. All rights reserved. The codes documented in this report are preliminary and upon coder review may  be revised to meet current compliance requirements. Hildred Laser, MD Hildred Laser, MD 07/14/2020 10:14:36 AM This report has been signed electronically. Number of Addenda: 0

## 2020-07-20 ENCOUNTER — Encounter (HOSPITAL_COMMUNITY): Payer: Self-pay | Admitting: Internal Medicine

## 2020-11-12 IMAGING — CT CT HEAD W/O CM
3 series · 15 of 47 positions shown, 18 images · non-contrast
Comparison: 07/13/2012

CLINICAL DATA: Dizziness and weakness

EXAM:
CT HEAD WITHOUT CONTRAST
TECHNIQUE: Contiguous axial images were obtained from the base of the skull
through the vertex without intravenous contrast.

[Series 2: head w o · axial · 0.40mm/px · z∈[+50,+180]mm · 9 of 32 slices shown, 12 images]
[im 3/32  brain]
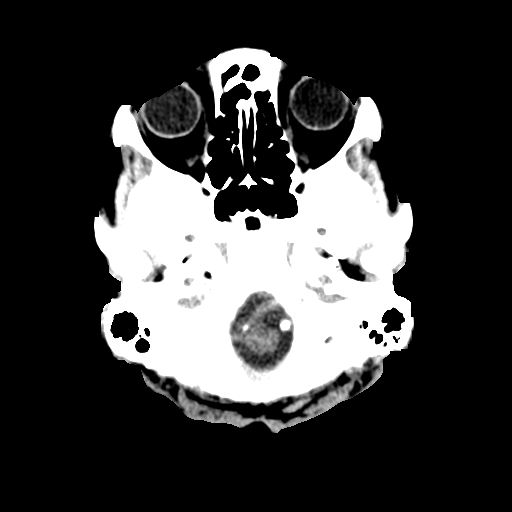
[im 3/32  bone]
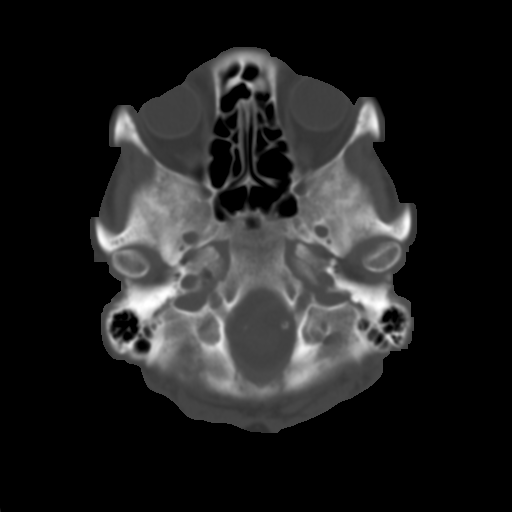
[im 6/32  brain]
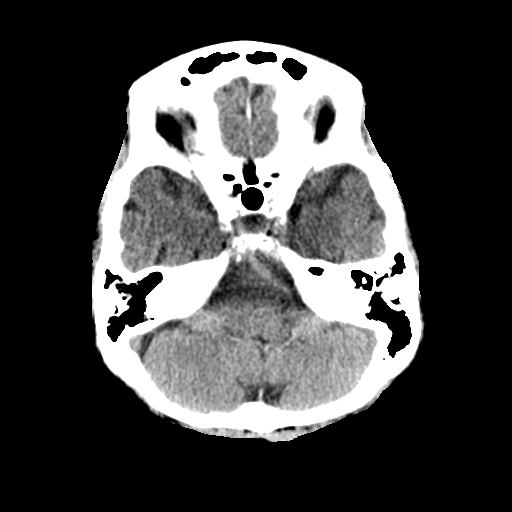
[im 9/32  brain]
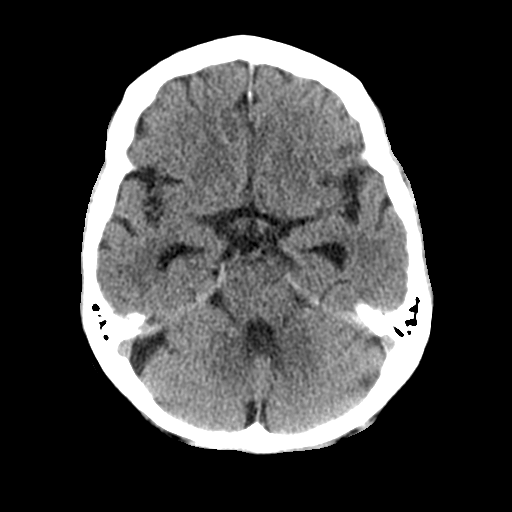
[im 12/32  brain]
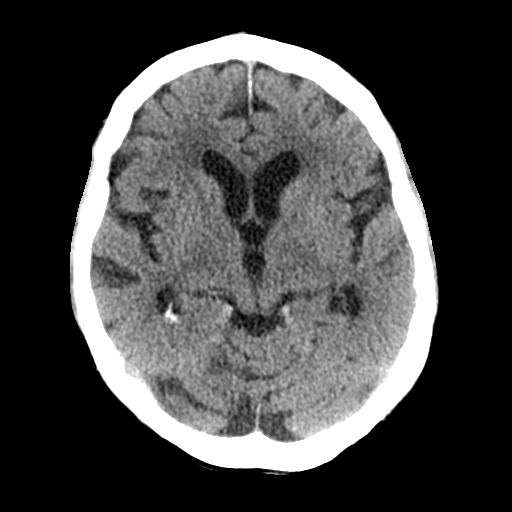
[im 17/32  brain]
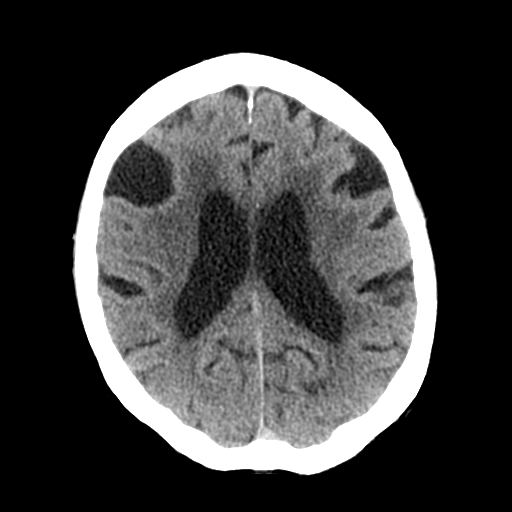
[im 17/32  bone]
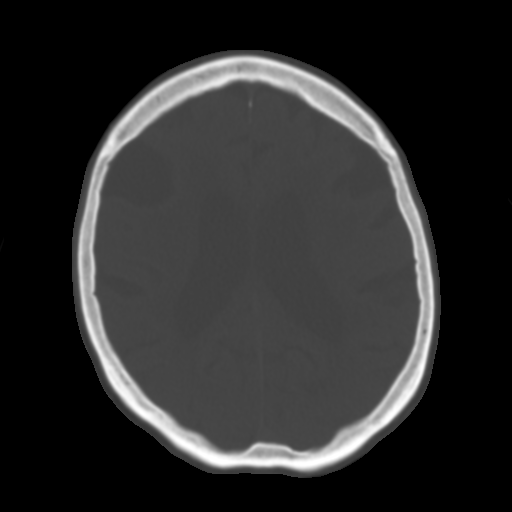
[im 20/32  brain]
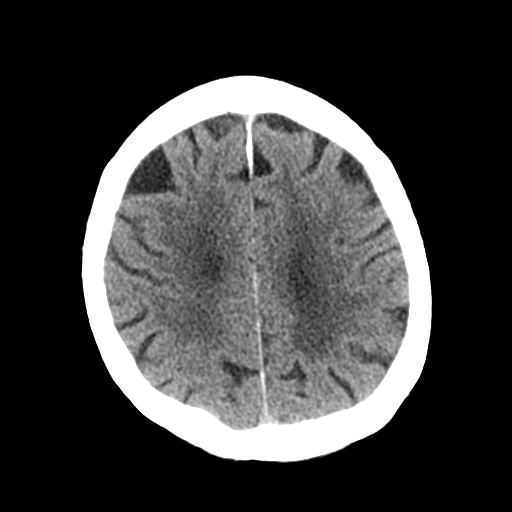
[im 23/32  brain]
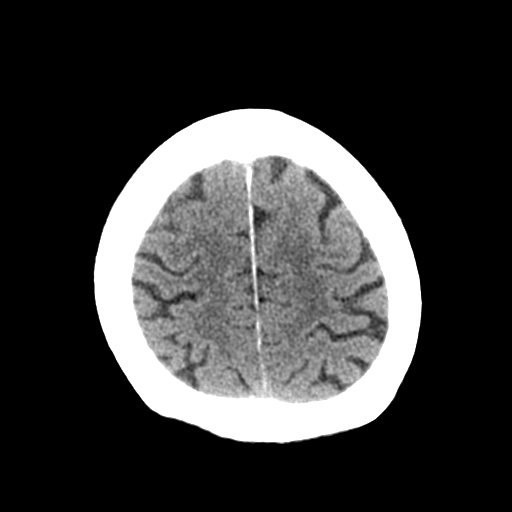
[im 26/32  brain]
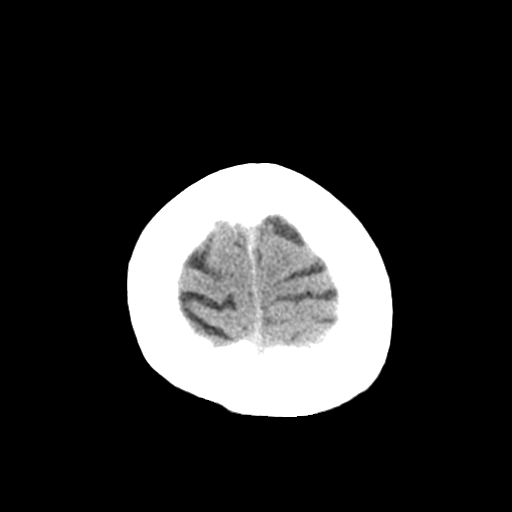
[im 29/32  brain]
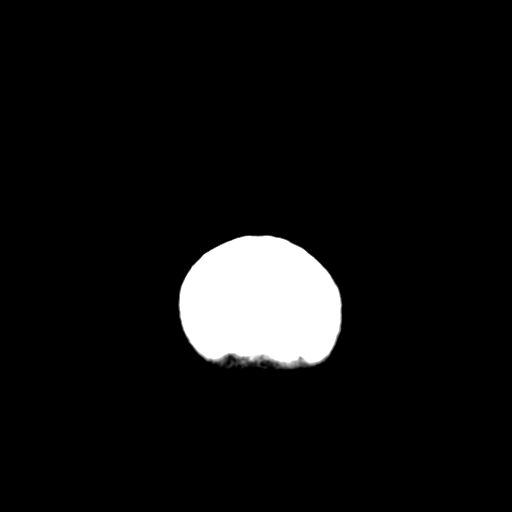
[im 29/32  bone]
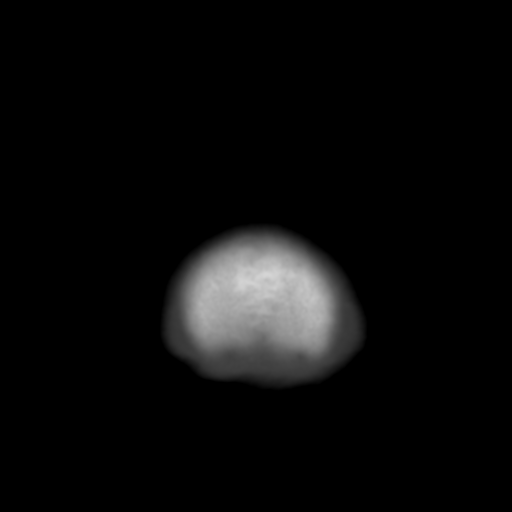

[Series 4: coronal soft · coronal · 0.34mm/px · 3 of 64 slices shown]
[im 22/64  brain]
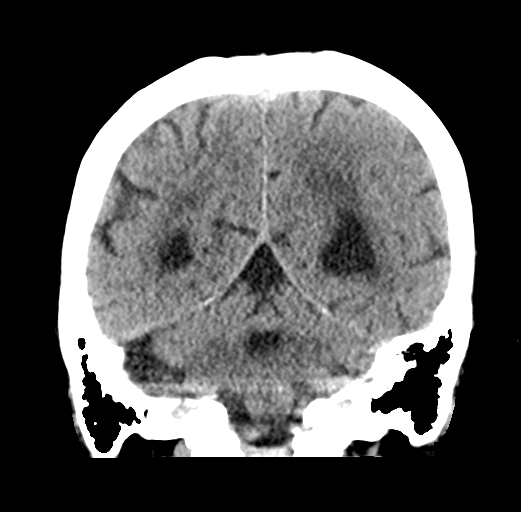
[im 29/64  brain]
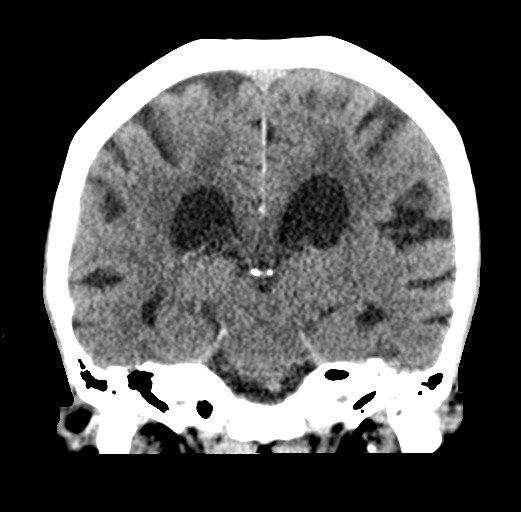
[im 36/64  brain]
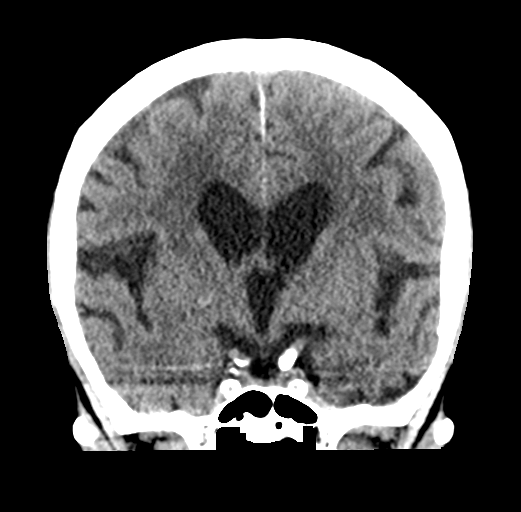

[Series 5: sagittal soft · sagittal · 0.35mm/px · 3 of 65 slices shown]
[im 22/65  brain]
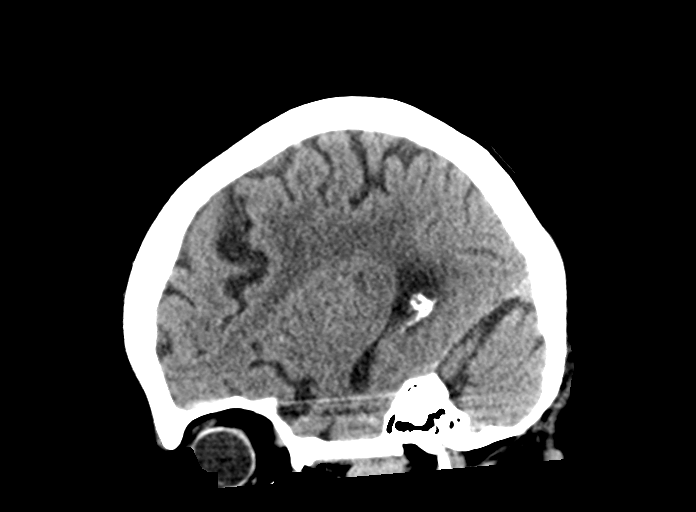
[im 33/65  brain]
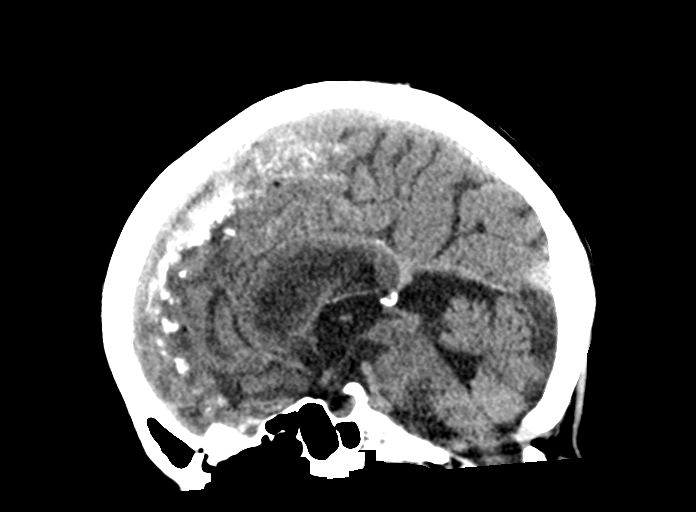
[im 43/65  brain]
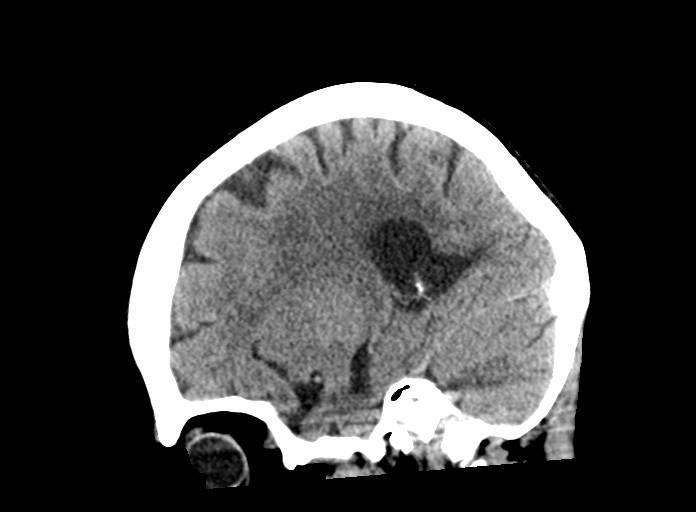

[15 of 47 positions shown; findings below may reference images not displayed]

FINDINGS: Brain: There is no mass, hemorrhage or extra-axial collection. There
is generalized atrophy without lobar predilection. Hypodensity of
the white matter is most commonly associated with chronic
microvascular disease.

Vascular: No abnormal hyperdensity of the major intracranial
arteries or dural venous sinuses. No intracranial atherosclerosis.

Skull: The visualized skull base, calvarium and extracranial soft
tissues are normal.

Sinuses/Orbits: No fluid levels or advanced mucosal thickening of
the visualized paranasal sinuses. No mastoid or middle ear effusion.
The orbits are normal.
IMPRESSION: Generalized atrophy and chronic microvascular ischemia without acute
intracranial abnormality.

## 2020-12-02 ENCOUNTER — Other Ambulatory Visit: Payer: Medicare HMO

## 2020-12-02 ENCOUNTER — Ambulatory Visit: Payer: Medicare HMO | Attending: Internal Medicine

## 2020-12-02 DIAGNOSIS — Z20822 Contact with and (suspected) exposure to covid-19: Secondary | ICD-10-CM

## 2020-12-02 DIAGNOSIS — Z23 Encounter for immunization: Secondary | ICD-10-CM

## 2020-12-02 NOTE — Progress Notes (Signed)
   Covid-19 Vaccination Clinic  Name:  Kristine [name]    MRN: [id number] DOB: 1941-08-26  12/02/2020  [name] was observed post Covid-19 immunization for 15 minutes without incident. She was provided with Vaccine Information Sheet and instruction to access the V-Safe system.   [name] was instructed to call 911 with any severe reactions post vaccine: Marland Kitchen Difficulty breathing  . Swelling of face and throat  . A fast heartbeat  . A bad rash all over body  . Dizziness and weakness   Immunizations Administered    Name Date Dose VIS Date Route   Moderna Covid-19 Booster Vaccine 12/02/2020  1:15 PM 0.25 mL 08/11/2020 Intramuscular   Manufacturer: Moderna   Lot: [id number]   [place]: [id number]

## 2020-12-03 LAB — SARS-COV-2, NAA 2 DAY TAT

## 2020-12-03 LAB — NOVEL CORONAVIRUS, NAA: SARS-CoV-2, NAA: NOT DETECTED

## 2020-12-19 ENCOUNTER — Emergency Department: Payer: Medicare HMO

## 2020-12-19 ENCOUNTER — Other Ambulatory Visit: Payer: Self-pay

## 2020-12-19 ENCOUNTER — Emergency Department
Admission: EM | Admit: 2020-12-19 | Discharge: 2020-12-19 | Disposition: A | Discharge status: Home / Self Care | Payer: Medicare HMO | Attending: Emergency Medicine | Admitting: Emergency Medicine

## 2020-12-19 DIAGNOSIS — Z7982 Long term (current) use of aspirin: Secondary | ICD-10-CM | POA: Diagnosis not present

## 2020-12-19 DIAGNOSIS — N189 Chronic kidney disease, unspecified: Secondary | ICD-10-CM | POA: Diagnosis not present

## 2020-12-19 DIAGNOSIS — Z87891 Personal history of nicotine dependence: Secondary | ICD-10-CM | POA: Diagnosis not present

## 2020-12-19 DIAGNOSIS — Z79899 Other long term (current) drug therapy: Secondary | ICD-10-CM | POA: Insufficient documentation

## 2020-12-19 DIAGNOSIS — N939 Abnormal uterine and vaginal bleeding, unspecified: Secondary | ICD-10-CM

## 2020-12-19 LAB — URINALYSIS, COMPLETE (UACMP) WITH MICROSCOPIC
Bilirubin Urine: NEGATIVE
Glucose, UA: NEGATIVE mg/dL
Ketones, ur: NEGATIVE mg/dL
Nitrite: NEGATIVE
Protein, ur: 100 mg/dL — AB
RBC / HPF: >50 RBC/hpf — ABNORMAL HIGH (ref 0–5)
Specific Gravity, Urine: 1.005 (ref 1.005–1.030)
WBC, UA: >50 WBC/hpf — ABNORMAL HIGH (ref 0–5)
pH: 6 (ref 5.0–8.0)

## 2020-12-19 LAB — COMPREHENSIVE METABOLIC PANEL
ALT: 22 U/L (ref 0–44)
AST: 26 U/L (ref 15–41)
Albumin: 3.8 g/dL (ref 3.5–5.0)
Alkaline Phosphatase: 72 U/L (ref 38–126)
Anion gap: 7 (ref 5–15)
BUN: 9 mg/dL (ref 8–23)
CO2: 26 mmol/L (ref 22–32)
Calcium: 9 mg/dL (ref 8.9–10.3)
Chloride: 101 mmol/L (ref 98–111)
Creatinine, Ser: 0.58 mg/dL (ref 0.44–1.00)
GFR, Estimated: >60 mL/min (ref >60)
Glucose, Bld: 123 mg/dL — ABNORMAL HIGH (ref 70–99)
Potassium: 4.2 mmol/L (ref 3.5–5.1)
Sodium: 134 mmol/L — ABNORMAL LOW (ref 135–145)
Total Bilirubin: 1.4 mg/dL — ABNORMAL HIGH (ref 0.3–1.2)
Total Protein: 6.9 g/dL (ref 6.5–8.1)

## 2020-12-19 LAB — CBC
HCT: 46.7 % — ABNORMAL HIGH (ref 36.0–46.0)
Hemoglobin: 15.3 g/dL — ABNORMAL HIGH (ref 12.0–15.0)
MCH: 29.4 pg (ref 26.0–34.0)
MCHC: 32.8 g/dL (ref 30.0–36.0)
MCV: 89.6 fL (ref 80.0–100.0)
Platelets: 210 10*3/uL (ref 150–400)
RBC: 5.21 MIL/uL — ABNORMAL HIGH (ref 3.87–5.11)
RDW: 13.4 % (ref 11.5–15.5)
WBC: 9.6 10*3/uL (ref 4.0–10.5)
nRBC: 0 % (ref 0.0–0.2)

## 2020-12-19 NOTE — ED Notes (Signed)
Chaperoned Dr. Kerman Passey perform Pelvic exam. Wet prep obtained and sent to lab.

## 2020-12-19 NOTE — ED Notes (Signed)
Provided DC instructions. Verbalized understanding.  

## 2020-12-19 NOTE — Discharge Instructions (Addendum)
Please call the number provided for OB/GYN to arrange a follow-up appointment this coming week for vaginal bleeding and possible biopsy.  Return to the emergency department for any significant increase in bleeding, or any other symptom personally concerning to yourself.

## 2020-12-19 NOTE — ED Provider Notes (Signed)
[facility] Emergency Department Provider Note  Time seen: 1:49 PM  I have reviewed the triage vital signs and the nursing notes.   HISTORY  Chief Complaint Vaginal Bleeding   HPI Kristine [name] is a 80 y.o. female with a past medical history of CKD presents to the emergency department for vaginal bleeding.  According to the patient she noticed yesterday a small drop of blood on her pad.  This morning she noted several more drops of blood.  Patient was concerned so she came to the emergency department for evaluation.  Patient denies any known hematuria.  Denies any known hemorrhoids or rectal bleeding.  Denies any abdominal or pelvic pain.  Past Medical History:  Diagnosis Date  . Chronic kidney disease    had kidney transplant    Patient Active Problem List   Diagnosis Date Noted  . Tubular adenoma 07/22/2012  . DIARRHEA 12/27/2009  . 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 CHOLECYSTECTOMY    . COLONOSCOPY  08/28/2012   Procedure: COLONOSCOPY;  Surgeon: Rogene Houston, MD;  [facility];  Service: Endoscopy;  Laterality: N/A;  930  . COLONOSCOPY WITH PROPOFOL N/A 07/14/2020   Procedure: COLONOSCOPY WITH PROPOFOL;  Surgeon: Rogene Houston, MD;  Location: [facility];  Service: Endoscopy;  Laterality: N/A;  930  . KIDNEY TRANSPLANT    . orif left wrist    . TONSILLECTOMY      Prior to Admission medications   Medication Sig Start Date End Date Taking? Authorizing Provider  amLODipine (NORVASC) 2.5 MG tablet Take 2.5 mg by mouth at bedtime.  03/02/20   [provider]  aspirin EC 81 MG tablet Take 81 mg by mouth daily.    [provider]  Cholecalciferol (VITAMIN D) 2000 UNITS CAPS Take 2,000 Units by mouth daily.     [provider]  mycophenolate (MYFORTIC) 180 MG EC tablet Take 540 mg by mouth 2 (two) times daily.    [provider]  simvastatin  (ZOCOR) 20 MG tablet Take 20 mg by mouth at bedtime.     [provider]  solifenacin (VESICARE) 10 MG tablet Take 10 mg by mouth daily. 05/03/20   [provider]  tacrolimus (PROGRAF) 0.5 MG capsule Take 0.5 mg by mouth 2 (two) times daily.     [provider]    No Known Allergies  No family history on file.  Social History Social History   Tobacco Use  . Smoking status: Former Research scientist (life sciences)  . Smokeless tobacco: Never Used  . Tobacco comment: quit in 1977  Substance Use Topics  . Alcohol use: No  . Drug use: No    Review of Systems Constitutional: Negative for fever. Cardiovascular: Negative for chest pain. Respiratory: Negative for shortness of breath. Gastrointestinal: Negative for abdominal pain Genitourinary: Negative for urinary compaints Musculoskeletal: Negative for musculoskeletal complaints Neurological: Negative for headache All other ROS negative  ____________________________________________   PHYSICAL EXAM:  VITAL SIGNS: ED Triage Vitals  Enc Vitals Group     BP 12/19/20 1327 (!) 176/89     Pulse Rate 12/19/20 1327 85     Resp 12/19/20 1327 18     Temp 12/19/20 1327 98.5 F (36.9 C)     Temp Source 12/19/20 1327 Oral     SpO2 12/19/20 1327 94 %     Weight 12/19/20 1328 115 lb (52.2 kg)     Height  12/19/20 1328 5\' 2"  (1.575 m)     Head Circumference --      Peak Flow --      Pain Score 12/19/20 1328 0     Pain Loc --      Pain Edu? --      Excl. in [place]? --    Constitutional: Alert and oriented. Well appearing and in no distress. Eyes: Normal exam ENT      Head: Normocephalic and atraumatic.      Mouth/Throat: Mucous membranes are moist. Cardiovascular: Normal rate, regular rhythm.  Respiratory: Normal respiratory effort without tachypnea nor retractions. Breath sounds are clear Gastrointestinal: Soft and nontender. No distention.  Musculoskeletal: Nontender with normal range of motion in all extremities.  Neurologic:   Normal speech and language. No gross focal neurologic deficits Skin:  Skin is warm, dry and intact.  Psychiatric: Mood and affect are normal.   ____________________________________________     RADIOLOGY  Ultrasound shows thickened endometrium  ____________________________________________   INITIAL IMPRESSION / ASSESSMENT AND PLAN / ED COURSE  Pertinent labs & imaging results that were available during my care of the patient were reviewed by me and considered in my medical decision making (see chart for details).   Patient presents to the emergency department for vaginal bleeding.  Patient states his only been a couple drops but she is worried so she came to the emergency department for evaluation.  Does not believe she is having rectal bleeding or hematuria.  We will check labs including a urinalysis.  We will perform a pelvic examination to evaluate for vaginal bleeding or cervical bleeding.  Patient will likely require an ultrasound.  Patient agreeable to work-up.  Ultrasound shows thickened endometrium.  Lab work is reassuring.  Pelvic examination shows mild amount of bleeding but is coming from the cervical os.  I spoke to Dr. Georgianne Fick of OB/GYN who recommends follow-up this week for outpatient biopsy.  Spoke to the patient who is agreeable to plan of care.  AIZLEY STENSETH was evaluated in Emergency Department on 12/19/2020 for the symptoms described in the history of present illness. She was evaluated in the context of the global COVID-19 pandemic, which necessitated consideration that the patient might be at risk for infection with the SARS-CoV-2 virus that causes COVID-19. Institutional protocols and algorithms that pertain to the evaluation of patients at risk for COVID-19 are in a state of rapid change based on information released by regulatory bodies including the CDC and federal and state organizations. These policies and algorithms were followed during the patient's care in the  ED.  ____________________________________________   FINAL CLINICAL IMPRESSION(S) / ED DIAGNOSES  Vaginal bleeding   Harvest Dark, MD 12/19/20 1546

## 2020-12-19 NOTE — ED Triage Notes (Signed)
Pt to ED POV for vaginal bleeding that started yesterday, pt denies pain but states bleeding has increased. Dark red in color.  Has not gone through more than 1 pad today. Ambulatory to treatment room. Skin color WDL Alert and oriented

## 2020-12-21 ENCOUNTER — Ambulatory Visit: Payer: Self-pay | Admitting: Obstetrics and Gynecology

## 2020-12-21 ENCOUNTER — Ambulatory Visit (INDEPENDENT_AMBULATORY_CARE_PROVIDER_SITE_OTHER): Payer: Medicare HMO | Admitting: Obstetrics and Gynecology

## 2020-12-21 ENCOUNTER — Encounter: Payer: Self-pay | Admitting: Obstetrics and Gynecology

## 2020-12-21 ENCOUNTER — Other Ambulatory Visit: Payer: Self-pay

## 2020-12-21 ENCOUNTER — Other Ambulatory Visit (HOSPITAL_COMMUNITY)
Admission: RE | Admit: 2020-12-21 | Discharge: 2020-12-21 | Disposition: A | Discharge status: Home / Self Care | Payer: Medicare HMO | Source: Ambulatory Visit | Attending: Obstetrics and Gynecology | Admitting: Obstetrics and Gynecology

## 2020-12-21 VITALS — BP 138/96 | Ht 62.0 in | Wt 110.0 lb

## 2020-12-21 DIAGNOSIS — R935 Abnormal findings on diagnostic imaging of other abdominal regions, including retroperitoneum: Secondary | ICD-10-CM | POA: Diagnosis not present

## 2020-12-21 DIAGNOSIS — N950 Postmenopausal bleeding: Secondary | ICD-10-CM | POA: Diagnosis present

## 2020-12-21 NOTE — Progress Notes (Signed)
Gynecology H&P  Chief Complaint: No chief complaint on file.   History of Present Illness: Patient is a 80 y.o. No obstetric history on file. presents evaluation of postmenopausal bleeding. The patient states she has had a single episode(s) of bleeding in the past 1 week(s).  The most recent episode occurred  3  day(s) ago.  The bleeding has been heavier than spotting . She describes the blood as Bright red in appearance.  She has had no additional complaints. She deniestrauma or other inciting event, bloating, cramping, early satiety and abdominal pain. She does not have a history of abnormal pap smears.  There are no other aggravating factors reported. There are no alleviating factors reported. The patient's past medical history is  Contributory for history of prior renal transplant.   She has had prior work up for postmenopausal bleeding.  ER ultrasound 12/19/2020 showed thickened irregular endometrial stripe at 28mm.    Review of Systems: 10 point review of systems negative unless otherwise noted in HPI  Past Medical History:  Patient Active Problem List   Diagnosis Date Noted  . Tubular adenoma 07/22/2012  . 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 CHOLECYSTECTOMY    . COLONOSCOPY  08/28/2012   Procedure: COLONOSCOPY;  Surgeon: Rogene Houston, MD;  [facility];  Service: Endoscopy;  Laterality: N/A;  930  . COLONOSCOPY WITH PROPOFOL N/A 07/14/2020   Procedure: COLONOSCOPY WITH PROPOFOL;  Surgeon: Rogene Houston, MD;  [facility];  Service: Endoscopy;  Laterality: N/A;  930  . KIDNEY TRANSPLANT    . orif left wrist    . TONSILLECTOMY      Family History:  No family history on file.  Social History:  Social History   Socioeconomic  History  . Marital status: Married    Spouse name: Not on file  . Number of children: Not on file  . Years of education: Not on file  . Highest education level: Not on file  Occupational History  . Not on file  Tobacco Use  . Smoking status: Former Research scientist (life sciences)  . Smokeless tobacco: Never Used  . Tobacco comment: quit in 1977  Substance and Sexual Activity  . Alcohol use: No  . Drug use: No  . Sexual activity: Not on file  Other Topics Concern  . Not on file  Social History Narrative  . Not on file   Social Determinants of Health   Financial Resource Strain: Not on file  Food Insecurity: Not on file  Transportation Needs: Not on file  Physical Activity: Not on file  Stress: Not on file  Social Connections: Not on file  Intimate Partner Violence: Not on file    Allergies:  No Known Allergies  Medications: Prior to Admission medications   Medication Sig Start Date End Date Taking? Authorizing Provider  amLODipine (NORVASC) 2.5 MG tablet Take 2.5 mg by mouth at bedtime.  03/02/20   [provider]  aspirin EC 81 MG tablet Take 81 mg by mouth daily.    [provider]  Cholecalciferol (VITAMIN D) 2000 UNITS CAPS Take 2,000 Units by mouth daily.     [provider]  mycophenolate (MYFORTIC) 180 MG EC tablet Take 540 mg by  mouth 2 (two) times daily.    [provider]  simvastatin (ZOCOR) 20 MG tablet Take 20 mg by mouth at bedtime.     [provider]  solifenacin (VESICARE) 10 MG tablet Take 10 mg by mouth daily. 05/03/20   [provider]  tacrolimus (PROGRAF) 0.5 MG capsule Take 0.5 mg by mouth 2 (two) times daily.     [provider]    Physical Exam Vitals: Blood pressure (!) 138/96, height 5\' 2"  (1.575 m), weight 110 lb (49.9 kg).   General: NAD, well nourished, appears stated age 4: normocephalic, anicteric Pulmonary: No increased work of breathing Genitourinary:  External: Normal external female  genitalia.  Normal urethral meatus, normal Bartholin's and Skene's glands.    Vagina: Normal vaginal mucosa, no evidence of prolapse.    Cervix: Grossly normal in appearance, mild bleeding.  External os is visible and able to accommodate a pap brush but to stenotic to allow passage of endometrial P[place].  Uterus: Non-enlarged, mobile, normal contour.  No CMT  Adnexa: ovaries non-enlarged, no adnexal masses  Rectal: deferred Extremities: no edema, erythema, or tenderness Neurologic: Grossly intact Psychiatric: mood appropriate, affect full  US PELVIS TRANSVAGINAL NON-OB (TV ONLY)  Result Date: 12/20/2020 CLINICAL DATA:  Vaginal bleeding.  Postmenopausal female. EXAM: TRANSABDOMINAL AND TRANSVAGINAL ULTRASOUND OF PELVIS TECHNIQUE: Both transabdominal and transvaginal ultrasound examinations of the pelvis were performed. Transabdominal technique was performed for global imaging of the pelvis including uterus, ovaries, adnexal regions, and pelvic cul-de-sac. It was necessary to proceed with endovaginal exam following the transabdominal exam to visualize the endometrium and ovaries. COMPARISON:  None FINDINGS: Uterus Measurements: 6.3 x 3.4 x 4.1 cm = volume: 46 mL. 1 cm calcified fibroid is seen in the posterior uterine corpus. Endometrium Thickness: 11 mm.  No focal abnormality visualized. Right ovary Measurements: Not visualized, however no adnexal mass identified. Left ovary Measurements: 1.8 x 1.5 x 1.5 cm (volume = 2.1 cm^3). 1 cm benign-appearing left ovarian cyst noted. No imaging follow-up recommended. Other findings No abnormal free fluid. IMPRESSION: Abnormal endometrial thickening measuring 11 mm. In the setting of post-menopausal bleeding, endometrial sampling is indicated to exclude carcinoma. If results are benign, sonohysterogram should be considered for focal lesion work-up. (Ref: Radiological Reasoning: Algorithmic Workup of Abnormal Vaginal Bleeding with Endovaginal Sonography and  Sonohysterography. AJR 2008; 235:T73-22) 1 cm calcified uterine fibroid. Electronically Signed   By: Marlaine Hind M.D.   On: 12/19/2020 14:43   US Pelvis Complete  Result Date: 12/19/2020 CLINICAL DATA:  Vaginal bleeding.  Postmenopausal female. EXAM: TRANSABDOMINAL AND TRANSVAGINAL ULTRASOUND OF PELVIS TECHNIQUE: Both transabdominal and transvaginal ultrasound examinations of the pelvis were performed. Transabdominal technique was performed for global imaging of the pelvis including uterus, ovaries, adnexal regions, and pelvic cul-de-sac. It was necessary to proceed with endovaginal exam following the transabdominal exam to visualize the endometrium and ovaries. COMPARISON:  None FINDINGS: Uterus Measurements: 6.3 x 3.4 x 4.1 cm = volume: 46 mL. 1 cm calcified fibroid is seen in the posterior uterine corpus. Endometrium Thickness: 11 mm.  No focal abnormality visualized. Right ovary Measurements: Not visualized, however no adnexal mass identified. Left ovary Measurements: 1.8 x 1.5 x 1.5 cm (volume = 2.1 cm^3). 1 cm benign-appearing left ovarian cyst noted. No imaging follow-up recommended. Other findings No abnormal free fluid. IMPRESSION: Abnormal endometrial thickening measuring 11 mm. In the setting of post-menopausal bleeding, endometrial sampling is indicated to exclude carcinoma. If results are benign, sonohysterogram should be considered for focal lesion work-up. (  Ref: Radiological Reasoning: Algorithmic Workup of Abnormal Vaginal Bleeding with Endovaginal Sonography and Sonohysterography. AJR 2008; 606:T01-60) 1 cm calcified uterine fibroid. Electronically Signed   By: Marlaine Hind M.D.   On: 12/19/2020 14:43   Assessment: 80 y.o. No obstetric history on file. presenting for evaluation of postmenopausal bleeding  Plan: Problem List Items Addressed This Visit   None   Visit Diagnoses    Postmenopausal bleeding    -  Primary   Abnormal endometrial ultrasound          1) We discussed that  menopause is a clinical diagnosis made after 12 months of amenorrhea.  The average age of menopause in the  General [place] population is 46 but there may be significant variation.  Any bleeding that happens after a 12 month period of amenorrhea warrants further work.  Possible etiologies of postmenopausal bleeding were discussed with the patient today.  These may range from benign etiologies such as urethral prolapse and atrophy, to indeterminate lesions such as submucosal fibroids or polyps which would require resection to accurately evaluate. The role of unopposed estrogen in the development of  dndometrial hyperplasia or carcinoma is discussed.  The risk of endometrial hyperplasia is linearly correlated with increasing BMI given the production of estrone by adipose tissue.  Work up will be include transvaginal ultrasound to assess the thickness of the endometrial lining as well as to assess for focal uterine lesions.  Negative ultrasound evaluation, defined as the absence of focal lesions and endometrial stripe of <23mm, effectively rules out carcinoma and confirms atrophy as the most likely etiology.  Should focal lesions be present these generally require hysteroscopic resection.  Should lining be greater >86mm endometrial biopsy is warranted to rule out hyperplasia or frank endometrial cancer.  Continued episodes of bleeding despite negative ultrasound also warrant endometrial sampling.  As the cervical pathology may also be implicated in postmenopausal bleeding prior cervical cytology was reviewed and repeated if required per ASCCP guidelines.   2)Given ultrasound has been obtained, and endometrial biopsy unable to be performed in office will post for hysteroscopy D&C.  My review of the ultrasound reveals irregularities in the endometrial stripe which appear suspicious for endometrial polyp.  The is no significant blood flow noted in the polyp or endometrial stripe.  While unable to obtain endometrial biopsy a  endocervical sampling was collected using the pap brush.  3) A total of 25 minutes were spent in face-to-face contact with the patient during this encounter with over half of that time devoted to counseling and coordination of care.  Prior imaging was independently reviewed by myself.    4) No follow-ups on file.   [name], MD, [facility], [facility] 12/21/2020, 10:38 AM

## 2020-12-23 LAB — SURGICAL PATHOLOGY

## 2020-12-27 ENCOUNTER — Telehealth: Payer: Self-pay

## 2020-12-27 NOTE — Telephone Encounter (Signed)
Called patient to schedule Hysteroscopy D&C w Georgianne Fick  DOS 3/22  H&P 3/14 @ 8:50   Covid testing 3/18 @ 8-10:30, [address], drive up and wear mask. Advised pt to quarantine until DOS.  Pre-admit phone call appointment to be requested - date and time will be included on H&P paper work. Also all appointments will be updated on pt MyChart. Explained that this appointment has a call window. Based on the time scheduled will indicate if the call will be received within a 4 hour window before 1:00 or after.  Advised that pt may also receive calls from the hospital pharmacy and pre-service center.  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 No secondary insurance.  Medical clearance requested from [facility] (per PAT).

## 2020-12-27 NOTE — Telephone Encounter (Signed)
-----   Message from [name], MD sent at 12/21/2020 11:35 AM EST ----- Regarding: Surgery Surgery Booking Request Patient Full Name:  ARMANII URBANIK  MRN: [id number]  DOB: 23-Jan-1941  Surgeon: [name], MD  Requested Surgery Date and Time: 1-2 weeks Primary Diagnosis AND Code: Postmenopausal bleeding Secondary Diagnosis and Code: Abnormal endometrial ultrasound, cervical stenosis Surgical Procedure: Hysteroscopy D&C RNFA Requested?: No L&D Notification: No Admission Status: same day surgery Length of Surgery: 50 min Special Case Needs: Yes H&P: Yes Phone Interview???:  No Interpreter: No Medical Clearance:  Maybe if anesthesia needs history of renal transplant Special Scheduling Instructions: No Any known health/anesthesia issues, diabetes, sleep apnea, latex allergy, defibrillator/pacemaker?: No Acuity: P2   (P1 highest, P2 delay may cause harm, P3 low, elective gyn, P4 lowest)

## 2020-12-29 ENCOUNTER — Telehealth: Payer: Self-pay

## 2020-12-29 NOTE — Telephone Encounter (Signed)
I returned patient's call. She is very confused about her appointments. She is very disoriented and is having trouble keep them straight even when writing them down and reading the info back to me. I told her I mailed the appt reminders to her (per pt request) Tuesday and she adv that she had not yet rec'd.  I spent 30 minutes on this call.

## 2020-12-30 NOTE — Telephone Encounter (Signed)
Patient called me 5 times within 1.5 hours and left the same msg regarding me sending her an email. I never adv that I was emailing her. I told her I would MAIL her appointment information, which I did. After receiving these many msgs, I send her a VERY detailed email.

## 2021-01-03 ENCOUNTER — Other Ambulatory Visit: Payer: Self-pay

## 2021-01-03 ENCOUNTER — Ambulatory Visit (INDEPENDENT_AMBULATORY_CARE_PROVIDER_SITE_OTHER): Payer: Medicare HMO | Admitting: Obstetrics and Gynecology

## 2021-01-03 ENCOUNTER — Encounter: Payer: Self-pay | Admitting: Obstetrics and Gynecology

## 2021-01-03 ENCOUNTER — Other Ambulatory Visit: Payer: BC Managed Care – PPO

## 2021-01-03 VITALS — BP 140/86 | Wt 112.0 lb

## 2021-01-03 DIAGNOSIS — Z01818 Encounter for other preprocedural examination: Secondary | ICD-10-CM

## 2021-01-03 DIAGNOSIS — R935 Abnormal findings on diagnostic imaging of other abdominal regions, including retroperitoneum: Secondary | ICD-10-CM | POA: Diagnosis not present

## 2021-01-03 DIAGNOSIS — N950 Postmenopausal bleeding: Secondary | ICD-10-CM | POA: Diagnosis not present

## 2021-01-03 DIAGNOSIS — N882 Stricture and stenosis of cervix uteri: Secondary | ICD-10-CM

## 2021-01-03 NOTE — Progress Notes (Signed)
Obstetrics & Gynecology Surgery H&P    Chief Complaint: Scheduled Surgery   History of Present Illness: Patient is a 80 y.o. No obstetric history on file. presenting for scheduled hysteroscopy, D&C, for the treatment or further evaluation of postmenopausal bleeding, thickened endometrium, and cervical stenosis precluding in office biopsy.   Prior Treatments prior to proceeding with surgery include: ultrasound evaluation, attempted in office ECC  Preoperative Pap: N/A >65 Preoperative Endometrial biopsy: 12/21/2020 ECC benign squamous and endocervical cells  Preoperative Ultrasound: Endometrial thickening 70mm   Review of Systems:10 point review of systems  Past Medical History:  Patient Active Problem List   Diagnosis Date Noted  . Tubular adenoma 07/22/2012  . 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 CHOLECYSTECTOMY    . COLONOSCOPY  08/28/2012   Procedure: COLONOSCOPY;  Surgeon: Rogene Houston, MD;  [facility]: [facility];  Service: Endoscopy;  Laterality: N/A;  930  . COLONOSCOPY WITH PROPOFOL N/A 07/14/2020   Procedure: COLONOSCOPY WITH PROPOFOL;  Surgeon: Rogene Houston, MD;  Location: [facility];  Service: Endoscopy;  Laterality: N/A;  930  . KIDNEY TRANSPLANT    . orif left wrist    . TONSILLECTOMY      Family History:  No family history on file.  Social History:  Social History   Socioeconomic History  . Marital status: Married    Spouse name: Not on file  . Number of children: Not on file  . Years of education: Not on file  . Highest education level: Not on file  Occupational History  . Not on file  Tobacco Use  . Smoking status: Former Research scientist (life sciences)  . Smokeless tobacco: Never Used  . Tobacco comment: quit in 1977  Vaping Use  . Vaping  Use: Never used  Substance and Sexual Activity  . Alcohol use: No  . Drug use: No  . Sexual activity: Not Currently  Other Topics Concern  . Not on file  Social History Narrative  . Not on file   Social Determinants of Health   Financial Resource Strain: Not on file  Food Insecurity: Not on file  Transportation Needs: Not on file  Physical Activity: Not on file  Stress: Not on file  Social Connections: Not on file  Intimate Partner Violence: Not on file    Allergies:  No Known Allergies  Medications: Prior to Admission medications   Medication Sig Start Date End Date Taking? Authorizing Provider  amLODipine (NORVASC) 2.5 MG tablet Take 2.5 mg by mouth at bedtime.  03/02/20   [provider]  aspirin EC 81 MG tablet Take 81 mg by mouth daily.    [provider]  Cholecalciferol (VITAMIN D) 2000 UNITS CAPS Take 2,000 Units by mouth daily.     [provider]  mycophenolate (MYFORTIC) 180 MG EC tablet Take 540 mg by mouth 2 (two) times daily.    [provider]  simvastatin (ZOCOR) 20 MG tablet Take 20 mg by mouth at bedtime.     [provider]  solifenacin (VESICARE) 10 MG tablet Take 10 mg by mouth daily at 12 noon. 05/03/20   [provider]  tacrolimus (PROGRAF) 0.5 MG capsule Take 0.5 mg by mouth 2 (two) times daily.  [provider]    Physical Exam Vitals: Blood pressure 140/86, weight 112 lb (50.8 kg).  General: NAD HEENT: normocephalic, anicteric Pulmonary: No increased work of breathing, CTAB Cardiovascular: RRR, distal pulses 2+ Extremities: no edema, erythema, or tenderness Neurologic: Grossly intact Psychiatric: mood appropriate, affect full  Imaging US PELVIS TRANSVAGINAL NON-OB (TV ONLY)  Result Date: 12/20/2020 CLINICAL DATA:  Vaginal bleeding.  Postmenopausal female. EXAM: TRANSABDOMINAL AND TRANSVAGINAL ULTRASOUND OF PELVIS TECHNIQUE: Both transabdominal and transvaginal ultrasound  examinations of the pelvis were performed. Transabdominal technique was performed for global imaging of the pelvis including uterus, ovaries, adnexal regions, and pelvic cul-de-sac. It was necessary to proceed with endovaginal exam following the transabdominal exam to visualize the endometrium and ovaries. COMPARISON:  None FINDINGS: Uterus Measurements: 6.3 x 3.4 x 4.1 cm = volume: 46 mL. 1 cm calcified fibroid is seen in the posterior uterine corpus. Endometrium Thickness: 11 mm.  No focal abnormality visualized. Right ovary Measurements: Not visualized, however no adnexal mass identified. Left ovary Measurements: 1.8 x 1.5 x 1.5 cm (volume = 2.1 cm^3). 1 cm benign-appearing left ovarian cyst noted. No imaging follow-up recommended. Other findings No abnormal free fluid. IMPRESSION: Abnormal endometrial thickening measuring 11 mm. In the setting of post-menopausal bleeding, endometrial sampling is indicated to exclude carcinoma. If results are benign, sonohysterogram should be considered for focal lesion work-up. (Ref: Radiological Reasoning: Algorithmic Workup of Abnormal Vaginal Bleeding with Endovaginal Sonography and Sonohysterography. AJR 2008; 366:Y40-34) 1 cm calcified uterine fibroid. Electronically Signed   By: Marlaine Hind M.D.   On: 12/19/2020 14:43   US Pelvis Complete  Result Date: 12/19/2020 CLINICAL DATA:  Vaginal bleeding.  Postmenopausal female. EXAM: TRANSABDOMINAL AND TRANSVAGINAL ULTRASOUND OF PELVIS TECHNIQUE: Both transabdominal and transvaginal ultrasound examinations of the pelvis were performed. Transabdominal technique was performed for global imaging of the pelvis including uterus, ovaries, adnexal regions, and pelvic cul-de-sac. It was necessary to proceed with endovaginal exam following the transabdominal exam to visualize the endometrium and ovaries. COMPARISON:  None FINDINGS: Uterus Measurements: 6.3 x 3.4 x 4.1 cm = volume: 46 mL. 1 cm calcified fibroid is seen in the posterior  uterine corpus. Endometrium Thickness: 11 mm.  No focal abnormality visualized. Right ovary Measurements: Not visualized, however no adnexal mass identified. Left ovary Measurements: 1.8 x 1.5 x 1.5 cm (volume = 2.1 cm^3). 1 cm benign-appearing left ovarian cyst noted. No imaging follow-up recommended. Other findings No abnormal free fluid. IMPRESSION: Abnormal endometrial thickening measuring 11 mm. In the setting of post-menopausal bleeding, endometrial sampling is indicated to exclude carcinoma. If results are benign, sonohysterogram should be considered for focal lesion work-up. (Ref: Radiological Reasoning: Algorithmic Workup of Abnormal Vaginal Bleeding with Endovaginal Sonography and Sonohysterography. AJR 2008; 742:V95-63) 1 cm calcified uterine fibroid. Electronically Signed   By: Marlaine Hind M.D.   On: 12/19/2020 14:43    Assessment: 80 y.o. No obstetric history on file. presenting for scheduled hysteroscopy, D&C  Plan: 1) I have discussed with the patient the indications for the procedure. Included in the discussion were the options of therapy, as wall as their individual risks, benefits, and complications. Ample time was given to answer all questions.   In office pipelle biopsy generally provides comparable results to [facility], however this sampling modality may miss focal lesions if these were previously documented on ultrasound.  It is because of the potential to miss focal lesions that hysteroscopy D&C is also warranted in patient with continued postmenopausal bleeding that is not self limited regardless of prior  in office biopsy results or ultrasound findings.  She understands that the risk of continued observation include worsening bleeding or worsening of any underlying pathology.  The choices include: 1. Doing nothing but following her symptoms 2. Attempts at hormonal manipulation with either BCP or Depo-Provera for premenopausal patients with no concern for focal lesion or endometrial pathology  3. D&C/hysteroscopy. 4. Endometrial ablation via Novasure or other techniques for premenopausal patients with no concern for focal lesion or endometrial pathology  5. As final resort, hysterectomy. After consideration of her history and findings, mutual decision has been made to proceed with D+C/hysteroscopy. While the incidence is low, the risks from this surgery include, but are not limited to, the risks of anesthesia, hemorrhage, infection, perforation, and injury to adjacent structures including bowel, bladder and blood vessels.    2) Routine postoperative instructions were reviewed with the patient and her family in detail today including the expected length of recovery and likely postoperative course.  The patient concurred with the proposed plan, giving informed written consent for the surgery today.  Patient instructed on the importance of being NPO after midnight prior to her procedure.  If warranted preoperative prophylactic antibiotics and SCDs ordered on call to the OR to meet SCIP guidelines and adhere to recommendation laid forth in [place] Number 104 May 2009  "Antibiotic Prophylaxis for Gynecologic Procedures".     [name], MD, [facility], [facility] 01/03/2021, 8:07 AM

## 2021-01-03 NOTE — H&P (View-Only) (Signed)
Obstetrics & Gynecology Surgery H&P    Chief Complaint: Scheduled Surgery   History of Present Illness: Patient is a 80 y.o. No obstetric history on file. presenting for scheduled hysteroscopy, D&C, for the treatment or further evaluation of postmenopausal bleeding, thickened endometrium, and cervical stenosis precluding in office biopsy.   Prior Treatments prior to proceeding with surgery include: ultrasound evaluation, attempted in office ECC  Preoperative Pap: N/A >65 Preoperative Endometrial biopsy: 12/21/2020 ECC benign squamous and endocervical cells  Preoperative Ultrasound: Endometrial thickening 36mm   Review of Systems:10 point review of systems  Past Medical History:  Patient Active Problem List   Diagnosis Date Noted  . Tubular adenoma 07/22/2012  . 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 CHOLECYSTECTOMY    . COLONOSCOPY  08/28/2012   Procedure: COLONOSCOPY;  Surgeon: Rogene Houston, MD;  [facility]: [facility];  Service: Endoscopy;  Laterality: N/A;  930  . COLONOSCOPY WITH PROPOFOL N/A 07/14/2020   Procedure: COLONOSCOPY WITH PROPOFOL;  Surgeon: Rogene Houston, MD;  Location: [facility];  Service: Endoscopy;  Laterality: N/A;  930  . KIDNEY TRANSPLANT    . orif left wrist    . TONSILLECTOMY      Family History:  No family history on file.  Social History:  Social History   Socioeconomic History  . Marital status: Married    Spouse name: Not on file  . Number of children: Not on file  . Years of education: Not on file  . Highest education level: Not on file  Occupational History  . Not on file  Tobacco Use  . Smoking status: Former Research scientist (life sciences)  . Smokeless tobacco: Never Used  . Tobacco comment: quit in 1977  Vaping Use  . Vaping  Use: Never used  Substance and Sexual Activity  . Alcohol use: No  . Drug use: No  . Sexual activity: Not Currently  Other Topics Concern  . Not on file  Social History Narrative  . Not on file   Social Determinants of Health   Financial Resource Strain: Not on file  Food Insecurity: Not on file  Transportation Needs: Not on file  Physical Activity: Not on file  Stress: Not on file  Social Connections: Not on file  Intimate Partner Violence: Not on file    Allergies:  No Known Allergies  Medications: Prior to Admission medications   Medication Sig Start Date End Date Taking? Authorizing Provider  amLODipine (NORVASC) 2.5 MG tablet Take 2.5 mg by mouth at bedtime.  03/02/20   [provider]  aspirin EC 81 MG tablet Take 81 mg by mouth daily.    [provider]  Cholecalciferol (VITAMIN D) 2000 UNITS CAPS Take 2,000 Units by mouth daily.     [provider]  mycophenolate (MYFORTIC) 180 MG EC tablet Take 540 mg by mouth 2 (two) times daily.    [provider]  simvastatin (ZOCOR) 20 MG tablet Take 20 mg by mouth at bedtime.     [provider]  solifenacin (VESICARE) 10 MG tablet Take 10 mg by mouth daily at 12 noon. 05/03/20   [provider]  tacrolimus (PROGRAF) 0.5 MG capsule Take 0.5 mg by mouth 2 (two) times daily.  [provider]    Physical Exam Vitals: Blood pressure 140/86, weight 112 lb (50.8 kg).  General: NAD HEENT: normocephalic, anicteric Pulmonary: No increased work of breathing, CTAB Cardiovascular: RRR, distal pulses 2+ Extremities: no edema, erythema, or tenderness Neurologic: Grossly intact Psychiatric: mood appropriate, affect full  Imaging US PELVIS TRANSVAGINAL NON-OB (TV ONLY)  Result Date: 12/20/2020 CLINICAL DATA:  Vaginal bleeding.  Postmenopausal female. EXAM: TRANSABDOMINAL AND TRANSVAGINAL ULTRASOUND OF PELVIS TECHNIQUE: Both transabdominal and transvaginal ultrasound  examinations of the pelvis were performed. Transabdominal technique was performed for global imaging of the pelvis including uterus, ovaries, adnexal regions, and pelvic cul-de-sac. It was necessary to proceed with endovaginal exam following the transabdominal exam to visualize the endometrium and ovaries. COMPARISON:  None FINDINGS: Uterus Measurements: 6.3 x 3.4 x 4.1 cm = volume: 46 mL. 1 cm calcified fibroid is seen in the posterior uterine corpus. Endometrium Thickness: 11 mm.  No focal abnormality visualized. Right ovary Measurements: Not visualized, however no adnexal mass identified. Left ovary Measurements: 1.8 x 1.5 x 1.5 cm (volume = 2.1 cm^3). 1 cm benign-appearing left ovarian cyst noted. No imaging follow-up recommended. Other findings No abnormal free fluid. IMPRESSION: Abnormal endometrial thickening measuring 11 mm. In the setting of post-menopausal bleeding, endometrial sampling is indicated to exclude carcinoma. If results are benign, sonohysterogram should be considered for focal lesion work-up. (Ref: Radiological Reasoning: Algorithmic Workup of Abnormal Vaginal Bleeding with Endovaginal Sonography and Sonohysterography. AJR 2008; 774:J28-78) 1 cm calcified uterine fibroid. Electronically Signed   By: Marlaine Hind M.D.   On: 12/19/2020 14:43   US Pelvis Complete  Result Date: 12/19/2020 CLINICAL DATA:  Vaginal bleeding.  Postmenopausal female. EXAM: TRANSABDOMINAL AND TRANSVAGINAL ULTRASOUND OF PELVIS TECHNIQUE: Both transabdominal and transvaginal ultrasound examinations of the pelvis were performed. Transabdominal technique was performed for global imaging of the pelvis including uterus, ovaries, adnexal regions, and pelvic cul-de-sac. It was necessary to proceed with endovaginal exam following the transabdominal exam to visualize the endometrium and ovaries. COMPARISON:  None FINDINGS: Uterus Measurements: 6.3 x 3.4 x 4.1 cm = volume: 46 mL. 1 cm calcified fibroid is seen in the posterior  uterine corpus. Endometrium Thickness: 11 mm.  No focal abnormality visualized. Right ovary Measurements: Not visualized, however no adnexal mass identified. Left ovary Measurements: 1.8 x 1.5 x 1.5 cm (volume = 2.1 cm^3). 1 cm benign-appearing left ovarian cyst noted. No imaging follow-up recommended. Other findings No abnormal free fluid. IMPRESSION: Abnormal endometrial thickening measuring 11 mm. In the setting of post-menopausal bleeding, endometrial sampling is indicated to exclude carcinoma. If results are benign, sonohysterogram should be considered for focal lesion work-up. (Ref: Radiological Reasoning: Algorithmic Workup of Abnormal Vaginal Bleeding with Endovaginal Sonography and Sonohysterography. AJR 2008; 676:H20-94) 1 cm calcified uterine fibroid. Electronically Signed   By: Marlaine Hind M.D.   On: 12/19/2020 14:43    Assessment: 80 y.o. No obstetric history on file. presenting for scheduled hysteroscopy, D&C  Plan: 1) I have discussed with the patient the indications for the procedure. Included in the discussion were the options of therapy, as wall as their individual risks, benefits, and complications. Ample time was given to answer all questions.   In office pipelle biopsy generally provides comparable results to [facility], however this sampling modality may miss focal lesions if these were previously documented on ultrasound.  It is because of the potential to miss focal lesions that hysteroscopy D&C is also warranted in patient with continued postmenopausal bleeding that is not self limited regardless of prior  in office biopsy results or ultrasound findings.  She understands that the risk of continued observation include worsening bleeding or worsening of any underlying pathology.  The choices include: 1. Doing nothing but following her symptoms 2. Attempts at hormonal manipulation with either BCP or Depo-Provera for premenopausal patients with no concern for focal lesion or endometrial pathology  3. D&C/hysteroscopy. 4. Endometrial ablation via Novasure or other techniques for premenopausal patients with no concern for focal lesion or endometrial pathology  5. As final resort, hysterectomy. After consideration of her history and findings, mutual decision has been made to proceed with D+C/hysteroscopy. While the incidence is low, the risks from this surgery include, but are not limited to, the risks of anesthesia, hemorrhage, infection, perforation, and injury to adjacent structures including bowel, bladder and blood vessels.    2) Routine postoperative instructions were reviewed with the patient and her family in detail today including the expected length of recovery and likely postoperative course.  The patient concurred with the proposed plan, giving informed written consent for the surgery today.  Patient instructed on the importance of being NPO after midnight prior to her procedure.  If warranted preoperative prophylactic antibiotics and SCDs ordered on call to the OR to meet SCIP guidelines and adhere to recommendation laid forth in [place] Number 104 May 2009  "Antibiotic Prophylaxis for Gynecologic Procedures".     [name], MD, [facility], [facility] 01/03/2021, 8:07 AM

## 2021-01-03 NOTE — Patient Instructions (Signed)
Atlas of pelvic anatomy and gynecologic surgery (4th ed., pp. 205-212). [place], [place]: Elsevier.">  Dilation and Curettage or Vacuum Curettage Dilation and curettage (D&C) and vacuum curettage are minor procedures. A D&C involves stretching the cervix (dilation) and scraping the inside lining of the uterus with surgical instruments (curettage). During a D&C, tissue is gently scraped from the lining of the uterus (endometrium), starting from the top portion of the uterus down to the lowest part of the uterus. During a vacuum curettage, the lining and tissue in the uterus are removed with the use of gentle suction. Curettage may be performed to either diagnose or treat a problem. For diagnosis A diagnostic curettage may be done if you have:  Irregular bleeding in the uterus.  Bleeding with the development of clots.  Spotting between menstrual periods.  Prolonged menstrual periods or other abnormal bleeding.  Bleeding after menopause.  No menstrual period (amenorrhea).  A change in size and shape of the uterus.  Abnormal endometrial cells discovered during a Pap test. For treatment Curettage may be done:  To remove an IUD (intrauterine device).  To remove remaining placenta after giving birth.  During an abortion.  During a miscarriage.  To remove growths in the lining of the uterus.  To remove some rare types of non-cancerous lumps (fibroids). Tell a health care provider about:  Any allergies you have, including allergies to prescribed medicine or latex.  All medicines you are taking, including vitamins, herbs, eye drops, creams, and over-the-counter medicines.  Any blood-thinning medicine you may be taking.  Any problems you or family members have had with anesthetic medicines.  Any blood disorders you have.  Any surgeries you have had.  Your medical history and any medical conditions you have.  Whether you are pregnant or may be pregnant.  Recent vaginal  infections you have had.  Recent menstrual periods, bleeding problems you have had, and what form of birth control (contraception) you use. What are the risks? Generally, this is a safe procedure. However, problems may occur, including:  Infection.  Heavy vaginal bleeding.  Allergic reactions to medicines.  Damage to the cervix or other structures or organs.  Development of scar tissue (adhesions) inside the uterus. This can cause abnormal periods and may make it harder to get pregnant.  A hole (perforation) in the wall of the uterus. This is rare. What happens before the procedure? Staying hydrated Follow instructions from your health care provider about hydration, which may include:  Up to 2 hours before the procedure - you may continue to drink clear liquids, such as water, clear fruit juice, black coffee, and plain tea.   Eating and drinking restrictions Follow instructions from your health care provider about eating and drinking, which may include:  8 hours before the procedure - stop eating heavy meals or foods, such as meat, fried foods, or fatty foods.  6 hours before the procedure - stop eating light meals or foods, such as toast or cereal.  6 hours before the procedure - stop drinking milk or drinks that contain milk.  2 hours before the procedure - stop drinking clear liquids. If your health care provider told you to take your medicine(s) on the day of your procedure, take them with only a sip of water. Medicines  Ask your health care provider about: ? Changing or stopping your regular medicines. This is especially important if you are taking diabetes medicines or blood thinners. ? Taking medicines such as aspirin and ibuprofen. These medicines can  thin your blood. Do not take these medicines unless your health care provider tells you to take them. ? Taking over-the-counter medicines, vitamins, herbs, and supplements.  You may be given a medicine to soften the cervix  in order to help with dilation. Surgery safety Ask your health care provider what steps will be taken to help prevent infection. These may include:  Removing hair at the surgery site.  Washing skin with a germ-killing soap.  Taking antibiotic medicine. General instructions  Do not use any products that contain nicotine or tobacco for at least 4 weeks before the procedure. These products include cigarettes, e-cigarettes, and chewing tobacco. If you need help quitting, ask your health care provider.  For 24 hours before your procedure, do not: ? Douche. ? Use tampons. ? Use medicines, creams, or suppositories in the vagina. ? Have sex.  You may be given a pregnancy test on the day of the procedure.  You may have a blood or urine sample taken.  Plan to have someone take you home from the hospital or clinic.  If you will be going home right after the procedure, plan to have someone with you for 24 hours. What happens during the procedure?  An IV will be inserted into one of your veins.  You will be given one of the following: ? A medicine that numbs the area in and around the cervix (local anesthetic). ? A medicine to make you fall asleep (general anesthetic).  You will lie down on your back, with your feet in foot rests (stirrups).  The size and position of your uterus will be checked.  A lubricated instrument (speculum or Sims retractor) will be inserted into the back side of your vagina. The speculum will be used to hold apart the walls of your vagina so your health care provider can see your cervix.  A tool (tenaculum) will be attached to the lip of the cervix to stabilize it.  Your cervix will be softened and dilated. This may be done by: ? Taking medicine, either orally or vaginally. ? Having thin rods (laminaria) or gradual widening instruments (tapered dilators) inserted into your cervix.  A small, sharp, curved instrument (curette) will be used to scrape a small  amount of tissue or cells from the endometrium or cervical canal. In some cases, gentle suction is applied with the curette.  The curette will then be removed.  The cells will be taken to a lab for testing. The procedure may vary among health care providers and hospitals.   What happens after the procedure?  Your blood pressure, heart rate, breathing rate, and blood oxygen level will be monitored until you leave the hospital or clinic.  You may have mild cramping, backache, pain, and light bleeding or spotting. You may pass small blood clots from your vagina.  You may have to wear compression stockings. These stockings help to prevent blood clots and reduce swelling in your legs. Summary  Dilation and curettage (D&C) involves stretching (dilating) the cervix and scraping the inside lining of the uterus (curettage).  Follow your health care provider's instructions about when to stop eating and drinking, and whether to stop or change any medicines.  After the procedure, you may have mild cramping, backache, pain, and light bleeding or spotting. You may pass small blood clots from your vagina.  Plan to have someone take you home from the hospital or clinic. This information is not intended to replace advice given to you by your health care  provider. Make sure you discuss any questions you have with your health care provider. Document Revised: 11/11/2019 Document Reviewed: 11/11/2019 Elsevier Patient Education  2021 [employer].   Dilation and Curettage or Vacuum Curettage, Care After This sheet gives you information about how to care for yourself after your procedure. Your health care provider may also give you more specific instructions. If you have problems or questions, contact your health care provider. What can I expect after the procedure? After the procedure, it is common to have:  Mild pain or cramping.  Some vaginal bleeding or spotting. These may last for up to 2 weeks  after your procedure. Follow these instructions at home: Medicines  Take over-the-counter and prescription medicines only as told by your health care provider. This is especially important if you take blood-thinning medicine.  Ask your health care provider if the medicine prescribed to you requires you to avoid driving or using machinery. Activity  If you were given a sedative during the procedure, it can affect you for several hours. Do not drive or operate machinery until your health care provider says that it is safe.  Rest as told by your health care provider.  Avoid sitting for a long time without moving. Get up to take short walks every 1-2 hours. This is important to improve blood flow and breathing. Ask for help if you feel weak or unsteady.  Do not lift anything that is heavier than 10 lb (4.5 kg), or the limit that you are told, until your health care provider says that it is safe.  Return to your normal activities as told by your health care provider. Ask your health care provider what activities are safe for you.   Lifestyle For at least 2 weeks, or as long as told by your health care provider, do not:  Douche.  Use tampons.  Have sex. General instructions  Wear compression stockings as told by your health care provider. These stockings help to prevent blood clots and reduce swelling in your legs.  It is up to you to get the results of your procedure. Ask your health care provider, or the department that is doing the procedure, when your results will be ready.  Keep all follow-up visits as told by your health care provider. This is important. Contact a health care provider if:  You have severe cramps that get worse or that do not get better with medicine.  You have severe pain in the abdomen.  You cannot drink fluids without vomiting.  You develop pain in a different area of your pelvis.  You have bad-smelling discharge from the vagina.  You have a rash. Get  help right away if:  You have vaginal bleeding that soaks more than one sanitary pad in 1 hour for 2 hours in a row, or you pass large clots from your vagina.  You have a fever that is above 100.60F (38.0C).  Your abdomen feels very tender or hard.  You have chest pain.  You have shortness of breath.  You feel dizzy or light-headed, or you faint.  You have pain in your neck or shoulder area. These symptoms may represent a serious problem that is an emergency. Do not wait to see if the symptoms will go away. Get medical help right away. Call your local emergency services (911 in the [place] Do not drive yourself to the hospital. Summary  After your procedure, it is common to have mild pain and bleeding or spotting that may last  for up to 2 weeks.  Rest as told. Avoid sitting for a long time without moving. Get up to take short walks every 1-2 hours.  Do not lift anything that is heavier than 10 lb (4.5 kg), or the limit that you are told.  Monitor yourself for any complications that may develop after the procedure.  Keep all follow-up visits as told by your health care provider. Know the symptoms for which you should get help right away. This information is not intended to replace advice given to you by your health care provider. Make sure you discuss any questions you have with your health care provider. Document Revised: 11/11/2019 Document Reviewed: 11/11/2019 Elsevier Patient Education  2021 [employer].

## 2021-01-04 ENCOUNTER — Other Ambulatory Visit
Admission: RE | Admit: 2021-01-04 | Discharge: 2021-01-04 | Disposition: A | Discharge status: Home / Self Care | Payer: BC Managed Care – PPO | Source: Ambulatory Visit | Attending: Obstetrics and Gynecology | Admitting: Obstetrics and Gynecology

## 2021-01-04 ENCOUNTER — Other Ambulatory Visit: Payer: Self-pay

## 2021-01-04 NOTE — Patient Instructions (Signed)
Your procedure is scheduled on: Tuesday 01/11/21.  Report to THE FIRST FLOOR REGISTRATION DESK IN THE MEDICAL MALL ON THE MORNING OF SURGERY FIRST, THEN YOU WILL CHECK IN AT THE SURGERY INFORMATION DESK LOCATED OUTSIDE THE SAME DAY SURGERY DEPARTMENT LOCATED ON 2ND FLOOR MEDICAL MALL ENTRANCE.  To find out your arrival time please call [phone] between 1PM - 3PM on Monday 01/10/21.   Remember: Instructions that are not followed completely may result in serious medical risk, up to and including death, or upon the discretion of your surgeon and anesthesiologist your surgery may need to be rescheduled.     __X__ 1. Do not eat food after midnight the night before your procedure.                 No gum chewing or hard candies. You may drink clear liquids up to 2 hours                 before you are scheduled to arrive for your surgery- DO NOT drink clear                 liquids within 2 hours of the start of your surgery.                 Clear Liquids include:  water, apple juice without pulp, clear carbohydrate                 drink such as Clearfast or Gatorade, Black Coffee or Tea (Do not add                 milk or creamer to coffee or tea).  __X__2.  On the morning of surgery brush your teeth with toothpaste and water, you may rinse your mouth with mouthwash if you wish.  Do not swallow any toothpaste or mouthwash.    __X__ 3.  No Alcohol for 24 hours before or after surgery.  __X__ 4.  Do Not Smoke or use e-cigarettes For 24 Hours Prior to Your Surgery.                 Do not use any chewable tobacco products for at least 6 hours prior to                 surgery.  __X__5.  Notify your doctor if there is any change in your medical condition      (cold, fever, infections).      Do NOT wear jewelry, make-up, hairpins, clips or nail polish. Do NOT wear lotions, powders, or perfumes.  Do NOT shave 48 hours prior to surgery. Men may shave face and neck. Do NOT bring valuables to the  hospital.     [facility] is not responsible for any belongings or valuables.   Contacts, dentures/partials or body piercings may not be worn into surgery. Bring a case for your contacts, glasses or hearing aids, a denture cup will be supplied.    Patients discharged the day of surgery will not be allowed to drive home.     __X__ Take these medicines the morning of surgery with A SIP OF WATER:     1. mycophenolate (MYFORTIC)  2. tacrolimus (PROGRAF)     __X__ Stop Blood Thinners: Aspirin on Friday 01/07/21.  __X__ Stop Anti-inflammatories 7 days before surgery such as Advil, Ibuprofen, Motrin, BC or Goodies Powder, Naprosyn, Naproxen, Aleve, Aspirin, Meloxicam. May take Tylenol if needed for pain or discomfort.   __X__Do not start  taking any new herbal supplements or vitamins prior to your procedure.    Wear comfortable clothing (specific to your surgery type) to the hospital.  Plan for stool softeners for home use; pain medications have a tendency to cause constipation. You can also help prevent constipation by eating foods high in fiber such as fruits and vegetables and drinking plenty of fluids as your diet allows.  After surgery, you can prevent lung complications by doing breathing exercises.Take deep breaths and cough every 1-2 hours. Your doctor may order a device called an Incentive Spirometer to help you take deep breaths.  Please call the [place] Department at [phone] if you have any questions about these instructions.

## 2021-01-07 ENCOUNTER — Other Ambulatory Visit: Payer: Medicare HMO

## 2021-01-10 ENCOUNTER — Encounter
Admission: RE | Admit: 2021-01-10 | Discharge: 2021-01-10 | Disposition: A | Discharge status: Home / Self Care | Payer: Medicare HMO | Source: Ambulatory Visit | Attending: Obstetrics and Gynecology | Admitting: Obstetrics and Gynecology

## 2021-01-10 ENCOUNTER — Other Ambulatory Visit: Payer: Self-pay

## 2021-01-10 DIAGNOSIS — Z20822 Contact with and (suspected) exposure to covid-19: Secondary | ICD-10-CM | POA: Insufficient documentation

## 2021-01-10 DIAGNOSIS — Z01812 Encounter for preprocedural laboratory examination: Secondary | ICD-10-CM | POA: Diagnosis not present

## 2021-01-10 LAB — BASIC METABOLIC PANEL
Anion gap: 6 (ref 5–15)
BUN: 8 mg/dL (ref 8–23)
CO2: 27 mmol/L (ref 22–32)
Calcium: 9.3 mg/dL (ref 8.9–10.3)
Chloride: 103 mmol/L (ref 98–111)
Creatinine, Ser: 0.54 mg/dL (ref 0.44–1.00)
GFR, Estimated: >60 mL/min (ref >60)
Glucose, Bld: 96 mg/dL (ref 70–99)
Potassium: 4.2 mmol/L (ref 3.5–5.1)
Sodium: 136 mmol/L (ref 135–145)

## 2021-01-10 LAB — SARS CORONAVIRUS 2 (TAT 6-24 HRS): SARS Coronavirus 2: NEGATIVE

## 2021-01-11 ENCOUNTER — Encounter
Admission: RE | Disposition: A | Discharge status: Home / Self Care | Payer: Self-pay | Source: Home / Self Care | Attending: Obstetrics and Gynecology

## 2021-01-11 ENCOUNTER — Encounter: Payer: Self-pay | Admitting: Obstetrics and Gynecology

## 2021-01-11 ENCOUNTER — Ambulatory Visit: Payer: Medicare HMO | Admitting: Certified Registered"

## 2021-01-11 ENCOUNTER — Ambulatory Visit
Admission: RE | Admit: 2021-01-11 | Discharge: 2021-01-11 | Disposition: A | Discharge status: Home / Self Care | Payer: Medicare HMO | Attending: Obstetrics and Gynecology | Admitting: Obstetrics and Gynecology

## 2021-01-11 DIAGNOSIS — N950 Postmenopausal bleeding: Secondary | ICD-10-CM

## 2021-01-11 DIAGNOSIS — Z87891 Personal history of nicotine dependence: Secondary | ICD-10-CM | POA: Insufficient documentation

## 2021-01-11 DIAGNOSIS — Z7982 Long term (current) use of aspirin: Secondary | ICD-10-CM | POA: Diagnosis not present

## 2021-01-11 DIAGNOSIS — Z940 Kidney transplant status: Secondary | ICD-10-CM | POA: Diagnosis not present

## 2021-01-11 DIAGNOSIS — N858 Other specified noninflammatory disorders of uterus: Secondary | ICD-10-CM | POA: Diagnosis not present

## 2021-01-11 DIAGNOSIS — N882 Stricture and stenosis of cervix uteri: Secondary | ICD-10-CM

## 2021-01-11 DIAGNOSIS — R935 Abnormal findings on diagnostic imaging of other abdominal regions, including retroperitoneum: Secondary | ICD-10-CM

## 2021-01-11 DIAGNOSIS — M4802 Spinal stenosis, cervical region: Secondary | ICD-10-CM | POA: Diagnosis not present

## 2021-01-11 DIAGNOSIS — N8500 Endometrial hyperplasia, unspecified: Secondary | ICD-10-CM | POA: Diagnosis not present

## 2021-01-11 DIAGNOSIS — Z79899 Other long term (current) drug therapy: Secondary | ICD-10-CM | POA: Diagnosis not present

## 2021-01-11 HISTORY — PX: HYSTEROSCOPY WITH D & C: SHX1775

## 2021-01-11 SURGERY — DILATATION AND CURETTAGE /HYSTEROSCOPY
Anesthesia: General

## 2021-01-11 MED ORDER — FAMOTIDINE 20 MG PO TABS: 20.0000 mg | ORAL_TABLET | Freq: Once | ORAL | Status: AC

## 2021-01-11 MED ORDER — DEXAMETHASONE SODIUM PHOSPHATE 10 MG/ML IJ SOLN
INTRAMUSCULAR | Status: DC | PRN
  Administered 2021-01-11: 4 mg via INTRAVENOUS

## 2021-01-11 MED ORDER — FENTANYL CITRATE (PF) 100 MCG/2ML IJ SOLN
25.0000 ug | INTRAMUSCULAR | Status: DC | PRN
Start: 2021-01-11 — End: 2021-01-11

## 2021-01-11 MED ORDER — ACETAMINOPHEN 10 MG/ML IV SOLN
INTRAVENOUS | Status: AC
  Filled 2021-01-11: qty 100

## 2021-01-11 MED ORDER — DEXAMETHASONE SODIUM PHOSPHATE 10 MG/ML IJ SOLN
INTRAMUSCULAR | Status: AC
  Filled 2021-01-11: qty 1

## 2021-01-11 MED ORDER — FENTANYL CITRATE (PF) 100 MCG/2ML IJ SOLN
INTRAMUSCULAR | Status: AC
  Filled 2021-01-11: qty 2

## 2021-01-11 MED ORDER — LIDOCAINE HCL (CARDIAC) PF 100 MG/5ML IV SOSY
PREFILLED_SYRINGE | INTRAVENOUS | Status: DC | PRN
  Administered 2021-01-11: 40 mg via INTRAVENOUS

## 2021-01-11 MED ORDER — POVIDONE-IODINE 10 % EX SWAB
2.0000 "application " | Freq: Once | CUTANEOUS | Status: AC
  Administered 2021-01-11: 2 via TOPICAL

## 2021-01-11 MED ORDER — PROPOFOL 10 MG/ML IV BOLUS
INTRAVENOUS | Status: DC | PRN
  Administered 2021-01-11: 80 mg via INTRAVENOUS
  Administered 2021-01-11: 30 mg via INTRAVENOUS

## 2021-01-11 MED ORDER — ONDANSETRON HCL 4 MG/2ML IJ SOLN
INTRAMUSCULAR | Status: DC | PRN
  Administered 2021-01-11: 4 mg via INTRAVENOUS

## 2021-01-11 MED ORDER — CHLORHEXIDINE GLUCONATE 0.12 % MT SOLN
OROMUCOSAL | Status: AC
  Administered 2021-01-11: 15 mL via OROMUCOSAL
  Filled 2021-01-11: qty 15

## 2021-01-11 MED ORDER — ONDANSETRON HCL 4 MG/2ML IJ SOLN
INTRAMUSCULAR | Status: AC
  Filled 2021-01-11: qty 2

## 2021-01-11 MED ORDER — LACTATED RINGERS IV SOLN: INTRAVENOUS | Status: DC

## 2021-01-11 MED ORDER — ACETAMINOPHEN 10 MG/ML IV SOLN
INTRAVENOUS | Status: DC | PRN
  Administered 2021-01-11: 750 mg via INTRAVENOUS

## 2021-01-11 MED ORDER — CHLORHEXIDINE GLUCONATE 0.12 % MT SOLN: 15.0000 mL | Freq: Once | OROMUCOSAL | Status: AC

## 2021-01-11 MED ORDER — ORAL CARE MOUTH RINSE: 15.0000 mL | Freq: Once | OROMUCOSAL | Status: AC

## 2021-01-11 MED ORDER — FENTANYL CITRATE (PF) 100 MCG/2ML IJ SOLN
INTRAMUSCULAR | Status: DC | PRN
  Administered 2021-01-11 (×2): 25 ug via INTRAVENOUS

## 2021-01-11 MED ORDER — HYDROCODONE-ACETAMINOPHEN 5-325 MG PO TABS: 1.0000 | ORAL_TABLET | Freq: Four times a day (QID) | ORAL | 0 refills | Status: DC | PRN

## 2021-01-11 MED ORDER — PROPOFOL 500 MG/50ML IV EMUL
INTRAVENOUS | Status: AC
  Filled 2021-01-11: qty 50

## 2021-01-11 MED ORDER — LIDOCAINE HCL (PF) 2 % IJ SOLN
INTRAMUSCULAR | Status: AC
  Filled 2021-01-11: qty 5

## 2021-01-11 MED ORDER — FAMOTIDINE 20 MG PO TABS
ORAL_TABLET | ORAL | Status: AC
  Administered 2021-01-11: 20 mg via ORAL
  Filled 2021-01-11: qty 1

## 2021-01-11 MED ORDER — ONDANSETRON HCL 4 MG/2ML IJ SOLN: 4.0000 mg | Freq: Once | INTRAMUSCULAR | Status: DC | PRN

## 2021-01-11 SURGICAL SUPPLY — 23 items
CATH ROBINSON RED A/P 16FR (CATHETERS) ×2 IMPLANT
COVER WAND RF STERILE (DRAPES) IMPLANT
DEVICE MYOSURE LITE (MISCELLANEOUS) ×1 IMPLANT
DEVICE MYOSURE REACH (MISCELLANEOUS) IMPLANT
ELECT REM PT RETURN 9FT ADLT (ELECTROSURGICAL)
ELECTRODE REM PT RTRN 9FT ADLT (ELECTROSURGICAL) IMPLANT
GLOVE SURG ENC MOIS LTX SZ7 (GLOVE) ×2 IMPLANT
GLOVE SURG UNDER LTX SZ7.5 (GLOVE) ×2 IMPLANT
GOWN STRL REUS W/ TWL LRG LVL3 (GOWN DISPOSABLE) ×2 IMPLANT
GOWN STRL REUS W/TWL LRG LVL3 (GOWN DISPOSABLE) ×4
INFUSOR MANOMETER BAG 3000ML (MISCELLANEOUS) IMPLANT
IV LACTATED RINGER IRRG 3000ML (IV SOLUTION) ×2
IV LR IRRIG 3000ML ARTHROMATIC (IV SOLUTION) IMPLANT
IV NS IRRIG 3000ML ARTHROMATIC (IV SOLUTION) ×2 IMPLANT
KIT PROCEDURE FLUENT (KITS) IMPLANT
KIT TURNOVER CYSTO (KITS) ×2 IMPLANT
MANIFOLD NEPTUNE II (INSTRUMENTS) ×2 IMPLANT
PACK DNC HYST (MISCELLANEOUS) ×2 IMPLANT
PAD OB MATERNITY 4.3X12.25 (PERSONAL CARE ITEMS) ×2 IMPLANT
PAD PREP 24X41 OB/GYN DISP (PERSONAL CARE ITEMS) ×2 IMPLANT
SEAL ROD LENS SCOPE MYOSURE (ABLATOR) ×2 IMPLANT
TOWEL OR 17X26 4PK STRL BLUE (TOWEL DISPOSABLE) ×2 IMPLANT
TUBING CONNECTING 10 (TUBING) ×2 IMPLANT

## 2021-01-11 NOTE — Transfer of Care (Signed)
Immediate Anesthesia Transfer of Care Note  Patient: Kristine [name]  Procedure(s) Performed: DILATATION AND CURETTAGE /HYSTEROSCOPY (N/A )  Patient Location: PACU  Anesthesia Type:General  Level of Consciousness: awake, alert  and oriented  Airway & Oxygen Therapy: Patient Spontanous Breathing and Patient connected to face mask oxygen  Post-op Assessment: Report given to RN and Post -op Vital signs reviewed and stable  Post vital signs: Reviewed and stable  Last Vitals:  Vitals Value Taken Time  BP 138/78 01/11/21 0820  Temp 36.7 C 01/11/21 0820  Pulse 54 01/11/21 0824  Resp 13 01/11/21 0824  SpO2 99 % 01/11/21 0824  Vitals shown include unvalidated device data.  Last Pain:  Vitals:   01/11/21 0820  TempSrc:   PainSc: Asleep      Patients Stated Pain Goal: 0 (35/59/74 1638)  Complications: No complications documented.

## 2021-01-11 NOTE — Op Note (Signed)
Preoperative Diagnosis: 1) 80 y.o. with postmenopausal bleeding 2) Thickened endometrial strip on ultrasound 3) Cervical stenosis  Postoperative Diagnosis: 1) 80 y.o. with postmenopausal bleeding 2) Atrophic appearing endometrium 3) Cervical stenosis   Operation Performed: Hysteroscopy, dilation and curettage  Indication: 80 year old female with one episode of postmenopausal bleeding, ultrasound revealing endometrial stripe above >2mm, and in office attempt at endometrial biopsy unsuccessful secondary to cervical stenosis.    Anesthesia: .General  Primary Surgeon: [name], MD  Assistant: none  Preoperative Antibiotics: none  Estimated Blood Loss: 1 mL  IV Fluids: 480mL  Urine Output:: ~28mL straight cath  Drains or Tubes: none  Implants: none  Specimens Removed: endometrial curettings  Complications: none  Intraoperative Findings:  Normal external cervical, stenotic internal cervical os.  Normal uterine cavity, normal tubal ostia.  Endometrium appear atrophic without focal abnormalities.  Patient Condition: stable  Procedure in Detail:  Patient was taken to the operating room were she was administered general endotracheal anesthesia.  She was positioned in the dorsal lithotomy position utilizing Allen stirups, prepped and draped in the usual sterile fashion.  Uterus was noted to be small in size, anteverted.   Prior to proceeding with the case a time out was performed.  Attention was turned to the patient's pelvis.  A red rubber catheter was used to empty the patient's bladder.  An operative speculum was placed to allow visualization of the cervix.  The anterior lip of the cervix was grasped with a single tooth tenaculum and the cervix was sequentially dilated using pratt dilators.  The hysteroscope was then advanced into the uterine cavity noting the above findings.  Given cervical stenosis four quadrant curettage was performed using a Myosure and the resulting  specimen collected and sent to pathology.    The single tooth tenaculum was removed from the cervix.  The tenaculum sites and cervix were noted to be  Hemostatic before removing the operative speculum.  Sponge needle and instrument counts were corrects times two.  The patient tolerated the procedure well and was taken to the recovery room in stable condition.

## 2021-01-11 NOTE — Anesthesia Procedure Notes (Signed)
Procedure Name: LMA Insertion Date/Time: 01/11/2021 7:37 AM Performed by: Johney Maine, CRNA Pre-anesthesia Checklist: Patient identified, Patient being monitored, Timeout performed, Emergency Drugs available and Suction available Patient Re-evaluated:Patient Re-evaluated prior to induction Oxygen Delivery Method: Circle system utilized Preoxygenation: Pre-oxygenation with 100% oxygen Induction Type: IV induction Ventilation: Mask ventilation without difficulty LMA: LMA inserted LMA Size: 3.5 Tube type: Oral Number of attempts: 1 Placement Confirmation: positive ETCO2 and breath sounds checked- equal and bilateral Tube secured with: Tape Dental Injury: Teeth and Oropharynx as per pre-operative assessment

## 2021-01-11 NOTE — Anesthesia Postprocedure Evaluation (Signed)
Anesthesia Post Note  Patient: Kristine [name]  Procedure(s) Performed: DILATATION AND CURETTAGE /HYSTEROSCOPY (N/A )  Patient location during evaluation: PACU Anesthesia Type: General Level of consciousness: awake and alert and oriented Pain management: pain level controlled Vital Signs Assessment: post-procedure vital signs reviewed and stable Respiratory status: spontaneous breathing, nonlabored ventilation and respiratory function stable Cardiovascular status: blood pressure returned to baseline and stable Postop Assessment: no signs of nausea or vomiting Anesthetic complications: no   No complications documented.   Last Vitals:  Vitals:   01/11/21 0846 01/11/21 0910  BP: 140/80 (!) 157/70  Pulse: (!) 46 (!) 49  Resp: 15 16  Temp: 36.8 C (!) 36.4 C  SpO2: 98% 100%    Last Pain:  Vitals:   01/11/21 0910  TempSrc: Temporal  PainSc: 0-No pain                 Shaylen Nephew

## 2021-01-11 NOTE — Interval H&P Note (Signed)
History and Physical Interval Note:  01/11/2021 7:24 AM  Kristine [name]  has presented today for surgery, with the diagnosis of Postmenopausal bleeding Abnormal endometrial ultrasound cervical stenosis.  The various methods of treatment have been discussed with the patient and family. After consideration of risks, benefits and other options for treatment, the patient has consented to  Procedure(s): DILATATION AND CURETTAGE /HYSTEROSCOPY (N/A) as a surgical intervention.  The patient's history has been reviewed, patient examined, no change in status, stable for surgery.  I have reviewed the patient's chart and labs.  Questions were answered to the patient's satisfaction.     [name]

## 2021-01-11 NOTE — Anesthesia Preprocedure Evaluation (Signed)
Anesthesia Evaluation  Patient identified by MRN, date of birth, ID band Patient awake    Reviewed: Allergy & Precautions, NPO status , Patient's Chart, lab work & pertinent test results  History of Anesthesia Complications Negative for: history of anesthetic complications  Airway Mallampati: II  TM Distance: >3 FB Neck ROM: Full    Dental  (+) Poor Dentition   Pulmonary neg sleep apnea, neg COPD, former smoker,    breath sounds clear to auscultation- rhonchi (-) wheezing      Cardiovascular Exercise Tolerance: Good (-) hypertension(-) CAD, (-) Past MI, (-) Cardiac Stents and (-) CABG  Rhythm:Regular Rate:Normal - Systolic murmurs and - Diastolic murmurs    Neuro/Psych neg Seizures negative neurological ROS  negative psych ROS   GI/Hepatic negative GI ROS, Neg liver ROS,   Endo/Other  negative endocrine ROSneg diabetes  Renal/GU Renal disease (s/p kidney transplant)     Musculoskeletal negative musculoskeletal ROS (+)   Abdominal (+) - obese,   Peds  Hematology negative hematology ROS (+)   Anesthesia Other Findings Past Medical History: No date: Chronic kidney disease     Comment:  had kidney transplant   Reproductive/Obstetrics                             Anesthesia Physical Anesthesia Plan  ASA: III  Anesthesia Plan: General   Post-op Pain Management:    Induction: Intravenous  PONV Risk Score and Plan: 2 and Ondansetron and Dexamethasone  Airway Management Planned: LMA  Additional Equipment:   Intra-op Plan:   Post-operative Plan:   Informed Consent: I have reviewed the patients History and Physical, chart, labs and discussed the procedure including the risks, benefits and alternatives for the proposed anesthesia with the patient or authorized representative who has indicated his/her understanding and acceptance.     Dental advisory given  Plan Discussed with:  CRNA and Anesthesiologist  Anesthesia Plan Comments:         Anesthesia Quick Evaluation

## 2021-01-12 LAB — SURGICAL PATHOLOGY

## 2021-01-19 ENCOUNTER — Ambulatory Visit (INDEPENDENT_AMBULATORY_CARE_PROVIDER_SITE_OTHER): Payer: Medicare HMO | Admitting: Obstetrics and Gynecology

## 2021-01-19 ENCOUNTER — Encounter: Payer: Self-pay | Admitting: Obstetrics and Gynecology

## 2021-01-19 ENCOUNTER — Other Ambulatory Visit: Payer: Self-pay

## 2021-01-19 VITALS — BP 138/82 | Wt 112.0 lb

## 2021-01-19 DIAGNOSIS — N8500 Endometrial hyperplasia, unspecified: Secondary | ICD-10-CM

## 2021-01-19 DIAGNOSIS — Z4889 Encounter for other specified surgical aftercare: Secondary | ICD-10-CM

## 2021-01-19 NOTE — Progress Notes (Signed)
      Postoperative Follow-up Patient presents post op from hysteroscopy D&C 1weeks ago for postmenopausal bleeding.  Subjective: Patient reports marked improvement in her preop symptoms. Eating a regular diet without difficulty. The patient is not having any pain.  Activity: normal activities of daily living.  Objective: Blood pressure 138/82, weight 112 lb (50.8 kg).  General: NAD Pulmonary: no increased work of breathing Extremities: no edema Neurologic: normal gait    Admission on 01/11/2021, Discharged on 01/11/2021  Component Date Value Ref Range Status  . SURGICAL PATHOLOGY 01/11/2021    Final-Edited                   Value:SURGICAL PATHOLOGY CASE: [id number] PATIENT: [facility] Surgical Pathology Report     Specimen Submitted: A. Endometrial curettings  Clinical History: Postmenopausal bleeding, abnormal endometrial ultrasound, cervical stenosis      DIAGNOSIS: A. ENDOMETRIUM; CURETTAGE: - BENIGN ENDOMETRIAL HYPERPLASIA. - NEGATIVE FOR ATYPIA / EIN AND MALIGNANCY.   GROSS DESCRIPTION: A. Labeled: Endometrial curettings Received: Formalin Collection time: 8:06 AM on 01/11/2021 Placed into formalin time: 8:07 AM on 01/11/2021 Tissue fragment(s): Multiple Size: Aggregate, 1.8 x 1 x 0.3 cm Description: Received in a white mesh collection bag are fragments of tan-pink soft tissue. Entirely submitted in 1 cassette.  RB 01/11/2021   Final Diagnosis performed by Betsy Pries, MD.   Electronically signed 01/12/2021 9:53:02AM The electronic signature indicates that the named Attending Pathologist has evaluated the specimen Technical component performed at [facility], [place], [place], [place] [address] Lab: [phone] Dir: Rush Farmer, MD, MMM  Professional component performed at [facility], [place], [facility], [place], [place],  [address] Lab: [phone] Dir: Dellia Nims. Rubinas, MD      Assessment: 80 y.o. s/p hysteroscopy D&C stable  Plan: Patient has done well after surgery with no apparent complications.  I have discussed the post-operative course to date, and the expected progress moving forward.  The patient understands what complications to be concerned about.  I will see the patient in routine follow up, or sooner if needed.    Activity plan: No restriction.  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  Patient not at risk for exogenous estrogen based on habitus.  Is using premarin twice weekly.  We discussed progestin treatment, vs expectant management with follow up ultrasound or biopsy at 6 month mark.  Given intraoperative findings and thing appearing endometrium decision made for 6 month follow up ultrasound        [name], MD, [place], [facility] 01/19/2021, 10:12 AM

## 2021-02-23 ENCOUNTER — Ambulatory Visit: Payer: Medicare HMO | Admitting: Obstetrics and Gynecology

## 2021-02-23 ENCOUNTER — Other Ambulatory Visit: Payer: Self-pay

## 2021-02-23 ENCOUNTER — Encounter: Payer: Self-pay | Admitting: Obstetrics and Gynecology

## 2021-02-23 VITALS — BP 138/78 | Wt 111.0 lb

## 2021-02-23 DIAGNOSIS — Z4889 Encounter for other specified surgical aftercare: Secondary | ICD-10-CM

## 2021-02-23 NOTE — Progress Notes (Signed)
      Postoperative Follow-up Patient presents post op from hysteroscopy, D&C 6weeks ago for postmenopausal bleeding, cervical stenosis, thickened endometrial stripe.  Subjective: Patient reports marked improvement in her preop symptoms. Eating a regular diet without difficulty. The patient is not having any pain.  Activity: normal activities of daily living.  Objective: Blood pressure 138/78, weight 111 lb (50.3 kg).  General: NAD Pulmonary: no increased work of breathing GU: normal external female genitalia normal cervix, no CMT, uterus normal in shape and contour, no adnexal tenderness or masses Extremities: no edema Neurologic: normal gait    Admission on 01/11/2021, Discharged on 01/11/2021  Component Date Value Ref Range Status  . SURGICAL PATHOLOGY 01/11/2021    Final-Edited                   Value:SURGICAL PATHOLOGY CASE: [id number] PATIENT: [facility] Surgical Pathology Report     Specimen Submitted: A. Endometrial curettings  Clinical History: Postmenopausal bleeding, abnormal endometrial ultrasound, cervical stenosis      DIAGNOSIS: A. ENDOMETRIUM; CURETTAGE: - BENIGN ENDOMETRIAL HYPERPLASIA. - NEGATIVE FOR ATYPIA / EIN AND MALIGNANCY.   GROSS DESCRIPTION: A. Labeled: Endometrial curettings Received: Formalin Collection time: 8:06 AM on 01/11/2021 Placed into formalin time: 8:07 AM on 01/11/2021 Tissue fragment(s): Multiple Size: Aggregate, 1.8 x 1 x 0.3 cm Description: Received in a white mesh collection bag are fragments of tan-pink soft tissue. Entirely submitted in 1 cassette.  RB 01/11/2021   Final Diagnosis performed by Betsy Pries, MD.   Electronically signed 01/12/2021 9:53:02AM The electronic signature indicates that the named Attending Pathologist has evaluated the specimen Technical component performed at [facility], [place], [place], [place] [address] Lab: [phone] Dir: Rush Farmer, MD, MMM   Professional component performed at [facility], [facility], [place], [place], [place] [address] Lab: [phone] Dir: Dellia Nims. Rubinas, MD     Assessment: 80 y.o. s/p hysteroscopy D&C stable  Plan: Patient has done well after surgery with no apparent complications.  I have discussed the post-operative course to date, and the expected progress moving forward.  The patient understands what complications to be concerned about.  I will see the patient in routine follow up, or sooner if needed.    Activity plan: No restriction.   [name], MD, [facility], [place] 02/23/2021, 11:43 AM

## 2021-02-28 ENCOUNTER — Encounter: Payer: Self-pay | Admitting: Emergency Medicine

## 2021-02-28 ENCOUNTER — Other Ambulatory Visit: Payer: Self-pay

## 2021-02-28 ENCOUNTER — Ambulatory Visit
Admission: EM | Admit: 2021-02-28 | Discharge: 2021-02-28 | Disposition: A | Discharge status: Home / Self Care | Payer: Medicare HMO | Attending: Family Medicine | Admitting: Family Medicine

## 2021-02-28 DIAGNOSIS — N3001 Acute cystitis with hematuria: Secondary | ICD-10-CM

## 2021-02-28 LAB — POCT URINALYSIS DIP (MANUAL ENTRY)
Bilirubin, UA: NEGATIVE
Glucose, UA: 100 mg/dL — AB
Nitrite, UA: POSITIVE — AB
Protein Ur, POC: 100 mg/dL — AB
Spec Grav, UA: 1.025 (ref 1.010–1.025)
Urobilinogen, UA: 1 E.U./dL
pH, UA: 6 (ref 5.0–8.0)

## 2021-02-28 MED ORDER — CEPHALEXIN 500 MG PO CAPS: 500.0000 mg | ORAL_CAPSULE | Freq: Three times a day (TID) | ORAL | 0 refills | Status: AC

## 2021-02-28 NOTE — ED Provider Notes (Signed)
[facility]    CSN: [id number] Arrival date & time: 02/28/21  3474      History   Chief Complaint No chief complaint on file.   HPI Kristine [name] is a 80 y.o. female.   Reports dysuria, frequency, urgency, hematuria for the last 4 days. She has hx of kidney transplant and chronic UTIs. She takes immunosuppressants daily. She is followed by [facility] for this. Has not attempted OTC treatment. Denies body aches, chills, fever, flank pain, other symptoms.  ROS per HPI  The history is provided by the patient.    Past Medical History:  Diagnosis Date  . Chronic kidney disease    had kidney transplant    Patient Active Problem List   Diagnosis Date Noted  . Endometrial hyperplasia without atypia 01/19/2021  . PMB (postmenopausal bleeding)   . Abnormal endometrial ultrasound   . Cervical stenosis (uterine cervix)   . Tubular adenoma 07/22/2012  . DIARRHEA 12/27/2009  . 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 CHOLECYSTECTOMY    . COLONOSCOPY  08/28/2012   Procedure: COLONOSCOPY;  Surgeon: Rogene Houston, MD;  Location: [facility];  Service: Endoscopy;  Laterality: N/A;  930  . COLONOSCOPY WITH PROPOFOL N/A 07/14/2020   Procedure: COLONOSCOPY WITH PROPOFOL;  Surgeon: Rogene Houston, MD;  Location: [facility];  Service: Endoscopy;  Laterality: N/A;  930  . HYSTEROSCOPY WITH D & C N/A 01/11/2021   Procedure: DILATATION AND CURETTAGE /HYSTEROSCOPY;  Surgeon: [name], MD;  Location: [facility];  Service: Gynecology;  Laterality: N/A;  . KIDNEY TRANSPLANT    . orif left wrist    . TONSILLECTOMY      OB History   No obstetric history on file.      Home Medications    Prior to Admission medications   Medication Sig Start Date End Date Taking? Authorizing Provider  cephALEXin (KEFLEX) 500 MG capsule Take 1 capsule (500 mg total) by mouth 3 (three) times daily for 10 days. 02/28/21 03/10/21 Yes Faustino Congress, NP  amLODipine (NORVASC) 2.5 MG tablet Take 2.5 mg by mouth at bedtime.  03/02/20   [provider]  aspirin EC 81 MG tablet Take 81 mg by mouth daily.    [provider]  Cholecalciferol (VITAMIN D) 2000 UNITS CAPS Take 2,000 Units by mouth daily.     [provider]  HYDROcodone-acetaminophen (NORCO/VICODIN) 5-325 MG tablet Take 1 tablet by mouth every 6 (six) hours as needed. Patient not taking: Reported on 02/23/2021 01/11/21   [name], MD  mycophenolate (MYFORTIC) 180 MG EC tablet Take 540 mg by mouth 2 (two) times daily.    [provider]  simvastatin (ZOCOR) 20 MG tablet Take 20 mg by mouth at bedtime.     [provider]  solifenacin (VESICARE) 10 MG tablet Take 10 mg by mouth daily at 12 noon. 05/03/20   [provider]  tacrolimus (PROGRAF) 0.5 MG capsule Take 0.5 mg by mouth 2 (two) times daily.     [provider]    Family History No family history on file.  Social History Social History   Tobacco Use  . Smoking status: Former Research scientist (life sciences)  . Smokeless tobacco: Never Used  . Tobacco comment: quit in 1977  Vaping Use  . Vaping Use: Never used  Substance Use Topics  . Alcohol use: No  . Drug use: No     Allergies  Patient has no known allergies.   Review of Systems Review of Systems   Physical Exam Triage Vital Signs ED Triage Vitals  Enc Vitals Group     BP 02/28/21 0954 132/81     Pulse Rate 02/28/21 0954 99     Resp 02/28/21 0954 18     Temp 02/28/21 0954 98.1 F (36.7 C)     Temp src --      SpO2 02/28/21 0954 98 %     Weight --      Height --      Head Circumference --      Peak Flow --      Pain Score 02/28/21 0948 0     Pain Loc --      Pain Edu? --      Excl. in [place]? --    No data found.  Updated Vital Signs BP 132/81   Pulse 99   Temp 98.1 F (36.7 C)   Resp 18   SpO2 98%       Physical Exam Vitals and nursing note reviewed.  Constitutional:       General: She is not in acute distress.    Appearance: Normal appearance. She is well-developed. She is not ill-appearing.  HENT:     Head: Normocephalic and atraumatic.     Mouth/Throat:     Mouth: Mucous membranes are moist.     Pharynx: Oropharynx is clear.  Eyes:     Conjunctiva/sclera: Conjunctivae normal.     Pupils: Pupils are equal, round, and reactive to light.  Cardiovascular:     Rate and Rhythm: Normal rate and regular rhythm.     Heart sounds: Normal heart sounds. No murmur heard.   Pulmonary:     Effort: Pulmonary effort is normal. No respiratory distress.     Breath sounds: Normal breath sounds.  Abdominal:     General: Bowel sounds are normal. There is no distension.     Palpations: Abdomen is soft. There is no mass.     Tenderness: There is abdominal tenderness (suprapubic). There is no right CVA tenderness, left CVA tenderness, guarding or rebound.     Hernia: No hernia is present.  Musculoskeletal:        General: Normal range of motion.     Cervical back: Normal range of motion and neck supple.  Skin:    General: Skin is warm and dry.     Capillary Refill: Capillary refill takes less than 2 seconds.  Neurological:     General: No focal deficit present.     Mental Status: She is alert and oriented to person, place, and time.  Psychiatric:        Mood and Affect: Mood normal.        Behavior: Behavior normal.        Thought Content: Thought content normal.      UC Treatments / Results  Labs (all labs ordered are listed, but only abnormal results are displayed) Labs Reviewed  POCT URINALYSIS DIP (MANUAL ENTRY) - Abnormal; Notable for the following components:      Result Value   Clarity, UA cloudy (*)    Glucose, UA =100 (*)    Ketones, POC UA trace (5) (*)    Blood, UA large (*)    Protein Ur, POC =100 (*)    Nitrite, UA Positive (*)    Leukocytes, UA Moderate (2+) (*)    All other components within normal limits  URINE CULTURE  EKG   Radiology No results found.  Procedures Procedures (including critical care time)  Medications Ordered in UC Medications - No data to display  Initial Impression / Assessment and Plan / UC Course  I have reviewed the triage vital signs and the nursing notes.  Pertinent labs & imaging results that were available during my care of the patient were reviewed by me and considered in my medical decision making (see chart for details).    Acute Cystitis with Hematuria  Upon chart review of last UTI with transplant services, Keflex was used to treat her UTI Prescribed the same, sent in Keflex 500mg  TID x 10 days Will send urine culture and will inform of results that require further treatment Discussed that if her symptoms are persisting, follow up with [facility] Follow up in the ER for high fever, trouble swallowing, trouble breathing, other concerning symptoms.  Final Clinical Impressions(s) / UC Diagnoses   Final diagnoses:  Acute cystitis with hematuria     Discharge Instructions     I have sent in Keflex for you to take three times per day for 10 days  We will send your urine in for a culture and will call you if any medication changes are needed.  Follow up with this office or with primary care if symptoms are persisting.  Follow up in the ER for high fever, trouble swallowing, trouble breathing, other concerning symptoms.       ED Prescriptions    Medication Sig Dispense Auth. Provider   cephALEXin (KEFLEX) 500 MG capsule Take 1 capsule (500 mg total) by mouth 3 (three) times daily for 10 days. 30 capsule Faustino Congress, NP     PDMP not reviewed this encounter.   Faustino Congress, NP 02/28/21 1030

## 2021-02-28 NOTE — ED Triage Notes (Signed)
Burning and urinary frequency since this past Thursday.

## 2021-02-28 NOTE — ED Notes (Signed)
Pt unable to provide enough urine for culture

## 2021-02-28 NOTE — Discharge Instructions (Signed)
I have sent in Keflex for you to take three times per day for 10 days  We will send your urine in for a culture and will call you if any medication changes are needed.  Follow up with this office or with primary care if symptoms are persisting.  Follow up in the ER for high fever, trouble swallowing, trouble breathing, other concerning symptoms.

## 2021-03-18 ENCOUNTER — Encounter: Payer: Self-pay | Admitting: Obstetrics & Gynecology

## 2021-03-18 ENCOUNTER — Other Ambulatory Visit: Payer: Self-pay

## 2021-03-18 ENCOUNTER — Ambulatory Visit: Payer: Medicare HMO | Admitting: Obstetrics & Gynecology

## 2021-03-18 VITALS — BP 120/80 | Ht 62.0 in | Wt 110.0 lb

## 2021-03-18 DIAGNOSIS — B379 Candidiasis, unspecified: Secondary | ICD-10-CM | POA: Diagnosis not present

## 2021-03-18 MED ORDER — FLUCONAZOLE 150 MG PO TABS: 150.0000 mg | ORAL_TABLET | Freq: Once | ORAL | 0 refills | Status: AC

## 2021-03-18 NOTE — Patient Instructions (Signed)
Fluconazole tablets Take one dose tonight What is this medicine? FLUCONAZOLE (floo KON na zole) is an antifungal medicine. It is used to treat certain kinds of fungal or yeast infections. This medicine may be used for other purposes; ask your health care provider or pharmacist if you have questions. COMMON BRAND NAME(S): Diflucan What should I tell my health care provider before I take this medicine? They need to know if you have any of these conditions:  irregular heartbeat or rhythm  kidney disease  liver disease  low levels of potassium in the blood  an unusual or allergic reaction to fluconazole, other azole antifungals, medicines, foods, dyes, or preservatives  pregnant or trying to get pregnant  breast-feeding How should I use this medicine? Take this medicine by mouth. Follow the directions on the prescription label. Do not take your medicine more often than directed. Talk to your pediatrician regarding the use of this medicine in children. Special care may be needed. This medicine has been used in children as young as 20 months of age. Overdosage: If you think you have taken too much of this medicine contact a poison control center or emergency room at once. NOTE: This medicine is only for you. Do not share this medicine with others. What if I miss a dose? If you miss a dose, take it as soon as you can. If it is almost time for your next dose, take only that dose. Do not take double or extra doses. What may interact with this medicine? Do not take this medicine with any of the following medications:  flibanserin  lomitapide  lonafarnib  other medicines that prolong the QT interval (cause an abnormal heart rhythm)  triazolam This medicine may also interact with the following medications:  certain antibiotics like rifabutin, rifampin  certain antivirals for HIV or hepatitis  certain medicines for blood pressure, heart disease, irregular heartbeat  certain  medicines for cholesterol like atorvastatin, lovastatin, and simvastatin  certain medicines for depression, like amitriptyline, nortriptyline  certain medicines for diabetes like glipizide or glyburide  certain medicines for seizures like carbamazepine, phenytoin  certain medicines that treat or prevent blood clots like warfarin  certain narcotic medicines for pain like alfentanil, fentanyl, methadone  cyclophosphamide  cyclosporine  ibrutinib  lemborexant  midazolam  NSAIDS, medicines for pain and inflammation, like ibuprofen or naproxen  olaparib  sirolimus  steroid medicines like prednisone  tacrolimus  theophylline  tofacitinib  tolvaptan  vinblastine  vincristine  vitamin A  voriconazole This list may not describe all possible interactions. Give your health care provider a list of all the medicines, herbs, non-prescription drugs, or dietary supplements you use. Also tell them if you smoke, drink alcohol, or use illegal drugs. Some items may interact with your medicine. What should I watch for while using this medicine? Visit your doctor or health care professional for regular checkups. If you are taking this medicine for a long time you may need blood work. Tell your doctor if your symptoms do not improve. Some fungal infections need many weeks or months of treatment to cure. Alcohol can increase possible damage to your liver. Avoid alcoholic drinks. If you have a vaginal infection, do not have sex until you have finished your treatment. You can wear a sanitary napkin. Do not use tampons. Wear freshly washed cotton, not synthetic, panties. What side effects may I notice from receiving this medicine? Side effects that you should report to your doctor or health care professional as soon as possible:  allergic reactions like skin rash or itching, hives, swelling of the lips, mouth, tongue, or throat  dark urine  feeling dizzy or faint  irregular heartbeat  or chest pain  redness, blistering, peeling or loosening of the skin, including inside the mouth  trouble breathing  unusual bruising or bleeding  vomiting  yellowing of the eyes or skin Side effects that usually do not require medical attention (report to your doctor or health care professional if they continue or are bothersome):  changes in how food tastes  diarrhea  headache  stomach upset or nausea This list may not describe all possible side effects. Call your doctor for medical advice about side effects. You may report side effects to FDA at 1-800-FDA-1088. Where should I keep my medicine? Keep out of the reach of children. Store at room temperature below 30 degrees C (86 degrees F). Throw away any medicine after the expiration date. NOTE: This sheet is a summary. It may not cover all possible information. If you have questions about this medicine, talk to your doctor, pharmacist, or health care provider.  2021 Elsevier/Gold Standard (2020-08-18 08:51:42)

## 2021-03-18 NOTE — Progress Notes (Signed)
  HPI:      [name] is a 80 y.o. presents today for a problem visit.  She complains of:  Vaginitis: Patient complains of an abnormal vaginal discharge for 2 days. Vaginal symptoms include burning.Vulvar symptoms include none.  Discharge described as: scant and white.Other associated symptoms: none. Recent UTI.  Has kidney transplant (2009).  Use Keflex then Cipro for UTI. Tried Monistat, 2 doses, difficult to administer due to vag dryness and narrowing.  PMHx: She  has a past medical history of Chronic kidney disease. Also,  has a past surgical history that includes Kidney transplant; Tonsillectomy; Cholecystectomy; orif left wrist; Colonoscopy (08/28/2012); Colonoscopy with propofol (N/A, 07/14/2020); and Hysteroscopy with D & C (N/A, 01/11/2021)., family history is not on file.,  reports that she has quit smoking. She has never used smokeless tobacco. She reports that she does not drink alcohol and does not use drugs.  She has a current medication list which includes the following prescription(s): amlodipine, aspirin ec, vitamin d, fluconazole, hydrocodone-acetaminophen, mycophenolate, simvastatin, solifenacin, and tacrolimus. Also, has No Known Allergies.  Review of Systems  All other systems reviewed and are negative.   Objective: BP 120/80   Ht 5\' 2"  (1.575 m)   Wt 110 lb (49.9 kg)   BMI 20.12 kg/m  Physical Exam Constitutional:      General: She is not in acute distress.    Appearance: She is well-developed.  Genitourinary:     No vaginal erythema or bleeding.      Right Adnexa: not tender and no mass present.    Left Adnexa: not tender and no mass present.    No cervical motion tenderness, discharge, polyp or nabothian cyst.     Uterus is not enlarged.     No uterine mass detected. HENT:     Head: Normocephalic and atraumatic.     Nose: Nose normal.  Abdominal:     General: There is no distension.     Palpations: Abdomen is soft.     Tenderness: There is no  abdominal tenderness.  Musculoskeletal:        General: Normal range of motion.  Neurological:     Mental Status: She is alert and oriented to person, place, and time.     Cranial Nerves: No cranial nerve deficit.  Skin:    General: Skin is warm and dry.  Psychiatric:        Attention and Perception: Attention normal.        Mood and Affect: Mood and affect normal.        Speech: Speech normal.        Behavior: Behavior normal.        Thought Content: Thought content normal.        Judgment: Judgment normal.     ASSESSMENT/PLAN:    Problem List Items Addressed This Visit     Yeast infection    -  Primary   Relevant Medications   fluconazole (DIFLUCAN) 150 MG tablet    Consider long term treatment for vag atrophy at a later visit  Barnett Applebaum, MD, [facility], [facility] 03/18/2021  5:03 PM

## 2021-03-22 ENCOUNTER — Ambulatory Visit (INDEPENDENT_AMBULATORY_CARE_PROVIDER_SITE_OTHER): Payer: Medicare HMO | Admitting: Adult Health

## 2021-03-22 ENCOUNTER — Encounter: Payer: Self-pay | Admitting: Adult Health

## 2021-03-22 ENCOUNTER — Other Ambulatory Visit: Payer: Self-pay

## 2021-03-22 VITALS — BP 134/82 | HR 78 | Ht 62.0 in | Wt 107.8 lb

## 2021-03-22 DIAGNOSIS — Z940 Kidney transplant status: Secondary | ICD-10-CM | POA: Diagnosis not present

## 2021-03-22 DIAGNOSIS — N952 Postmenopausal atrophic vaginitis: Secondary | ICD-10-CM | POA: Insufficient documentation

## 2021-03-22 DIAGNOSIS — R399 Unspecified symptoms and signs involving the genitourinary system: Secondary | ICD-10-CM | POA: Insufficient documentation

## 2021-03-22 LAB — POCT URINALYSIS DIPSTICK OB
Blood, UA: NEGATIVE
Glucose, UA: NEGATIVE
Leukocytes, UA: NEGATIVE
Nitrite, UA: NEGATIVE

## 2021-03-22 MED ORDER — PREMARIN 0.625 MG/GM VA CREA: TOPICAL_CREAM | VAGINAL | 0 refills | Status: DC

## 2021-03-22 NOTE — Progress Notes (Signed)
  Subjective:     Patient ID: Kristine [name], female   DOB: 1941-08-26, 80 y.o.   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 Has been treated for UTI recently with cipro and keflex, and yeast with diflucan.  She had kidney transplant 12.5 years ago. PCP is Dr Willey Blade.  Review of Systems Has burning and sting in vaginal area Hs urinary frequency  Has sex occasionally   Reviewed past medical,surgical, social and family history. Reviewed medications and allergies.     Objective:   Physical Exam BP 134/82 (BP Location: Right Arm, Patient Position: Sitting, Cuff Size: Normal)   Pulse 78   Ht 5\' 2"  (1.575 m)   Wt 107 lb 12.8 oz (48.9 kg)   BMI 19.72 kg/m urine dipstick is negative Skin warm and dry.Pelvic: external genitalia is normal in appearance no lesions, vagina: atrophic,urethra has no lesions or masses noted, cervix:smooth,and atrophic, uterus: normal size, shape and contour, non tender, no masses felt, adnexa: no masses or tenderness noted. Bladder is non tender and no masses felt.    AA is 0  Fall risk is low PHQ 9 score is 0 GAD 7 score is 0  Upstream - 03/22/21 0955      Pregnancy Intention Screening   Does the patient want to become pregnant in the next year? No    Does the patient's partner want to become pregnant in the next year? No    Would the patient like to discuss contraceptive options today? No      Contraception Wrap Up   Current Method No Method - Other Reason   Post-menopausal   End Method No Method - Other Reason    Contraception Counseling Provided No         Examination chaperoned by Glenard Haring RN  Assessment:       1. Symptoms of urinary tract infection Call for urine culture at [facility] 03/08/21   2. Vaginal atrophy Will try premarin cream(she has used in the past, but stopped) Meds ordered this encounter  Medications  . conjugated  estrogens (PREMARIN) vaginal cream    Sig: Use 0.5 gm in vagina at bedtime for 2 weeks then 2 x weekly    Dispense:  24 g    Refill:  0    Order Specific Question:   Supervising Provider    Answer:   Elonda Husky, LUTHER H [[id number]]    3. History of kidney transplant Was 12.5 years ago    Plan:     Follow up in 4 weeks with me

## 2021-03-23 ENCOUNTER — Encounter: Payer: BC Managed Care – PPO | Admitting: Adult Health

## 2021-03-24 ENCOUNTER — Telehealth: Payer: Self-pay | Admitting: Adult Health

## 2021-04-19 ENCOUNTER — Other Ambulatory Visit: Payer: Self-pay

## 2021-04-19 ENCOUNTER — Ambulatory Visit (INDEPENDENT_AMBULATORY_CARE_PROVIDER_SITE_OTHER): Payer: Medicare HMO | Admitting: Adult Health

## 2021-04-19 ENCOUNTER — Encounter: Payer: Self-pay | Admitting: Adult Health

## 2021-04-19 VITALS — BP 127/73 | HR 61 | Ht 62.0 in | Wt 108.0 lb

## 2021-04-19 DIAGNOSIS — N952 Postmenopausal atrophic vaginitis: Secondary | ICD-10-CM

## 2021-04-19 NOTE — Progress Notes (Signed)
  Subjective:     Patient ID: Kristine [name], female   DOB: 03-02-1941, 80 y.o.   MRN: [id number]  HPI Kristine [name] is a 80 year old white female, married, PM, back in follow up on using premarin vaginal cream, no itching or burning and has not had sex. PCP is Dr Willey Blade.  Review of Systems No itching or burning now Reviewed past medical,surgical, social and family history. Reviewed medications and allergies.     Objective:   Physical Exam BP 127/73 (BP Location: Left Arm, Patient Position: Sitting, Cuff Size: Normal)   Pulse 61   Ht 5\' 2"  (1.575 m)   Wt 108 lb (49 kg)   BMI 19.75 kg/m  Skin warm and dry.Pelvic: external genitalia is normal in appearance no lesions, vagina:pinker with more moisture  Examination chaperoned by Celene Squibb LPN     Assessment:     1. Vaginal atrophy Continue PVC 2-3 x a week, call when needs refill  Can have sex use good lubricate    Plan:     Follow up

## 2021-05-16 ENCOUNTER — Telehealth: Payer: Self-pay | Admitting: Adult Health

## 2021-05-16 MED ORDER — PREMARIN 0.625 MG/GM VA CREA: TOPICAL_CREAM | VAGINAL | 3 refills | Status: DC

## 2021-05-16 NOTE — Telephone Encounter (Signed)
Pt called due to running low on her Premarin, states Anderson Malta told her to call the office for possible samples  Please advise & call pt   [address]

## 2021-05-16 NOTE — Telephone Encounter (Signed)
Pt aware that I have no samples and rx sent in for PVC, if expensive, ask cash price using [facility]

## 2021-05-17 ENCOUNTER — Telehealth: Payer: Self-pay | Admitting: Adult Health

## 2021-05-17 MED ORDER — ESTRADIOL 0.1 MG/GM VA CREA: TOPICAL_CREAM | VAGINAL | 3 refills | Status: DC

## 2021-05-17 NOTE — Telephone Encounter (Signed)
Left message I sent rx for estrace to [facility] in [facility]

## 2021-05-17 NOTE — Telephone Encounter (Signed)
Pt called, states she found Estradiol cream @ [facility] in [place] for a lot less cost than the Premarin (her insurance didn't cover)  Pt wonders if this can be used in place of the Premarin.  Can we order?  Please advise & notify pt

## 2021-06-04 IMAGING — US US PELVIS COMPLETE
1 series · 13 of 25 positions shown · non-contrast
Comparison: None

CLINICAL DATA: Vaginal bleeding.  Postmenopausal female.

EXAM:
TRANSABDOMINAL AND TRANSVAGINAL ULTRASOUND OF PELVIS
TECHNIQUE: Both transabdominal and transvaginal ultrasound examinations of the
pelvis were performed. Transabdominal technique was performed for
global imaging of the pelvis including uterus, ovaries, adnexal
regions, and pelvic cul-de-sac. It was necessary to proceed with
endovaginal exam following the transabdominal exam to visualize the
endometrium and ovaries.

[Series 1: us pelvis (transabdominal only) · 13 of 77 slices shown]
[im 1/77]
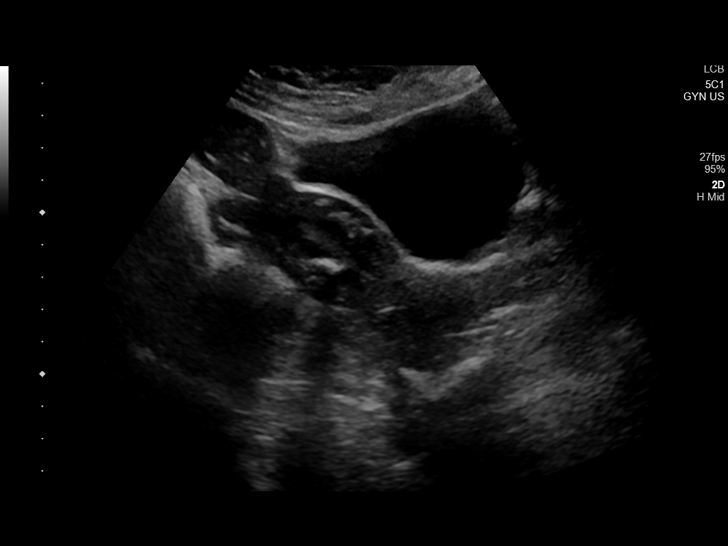
[im 7/77]
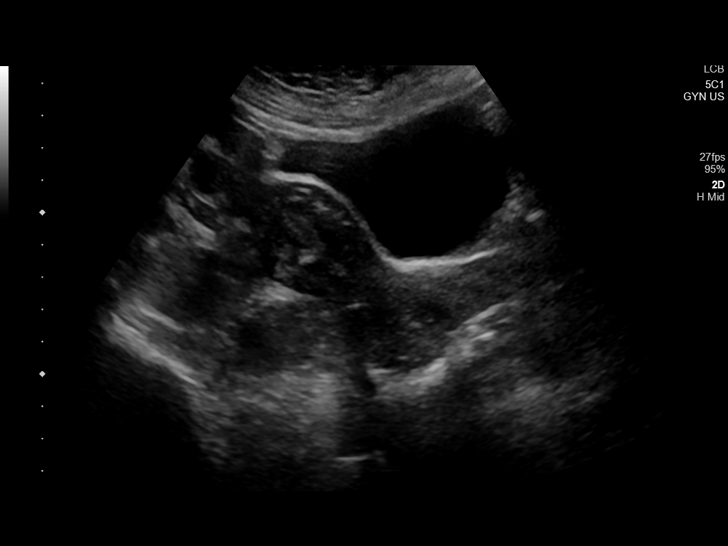
[im 13/77]
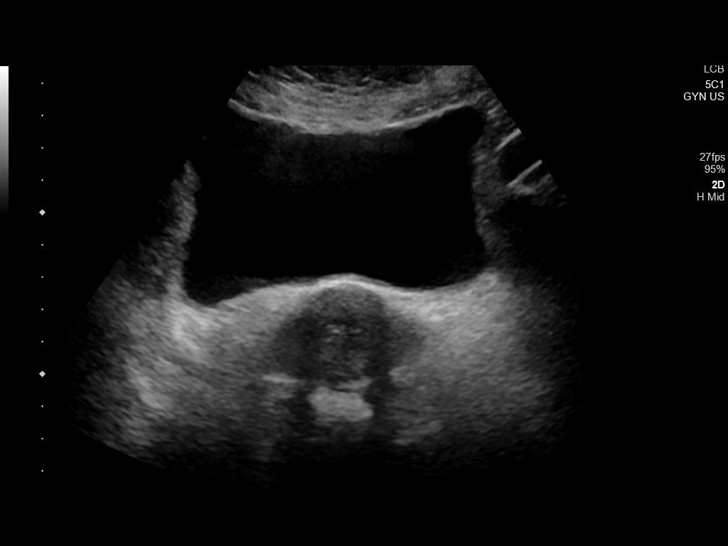
[im 20/77]
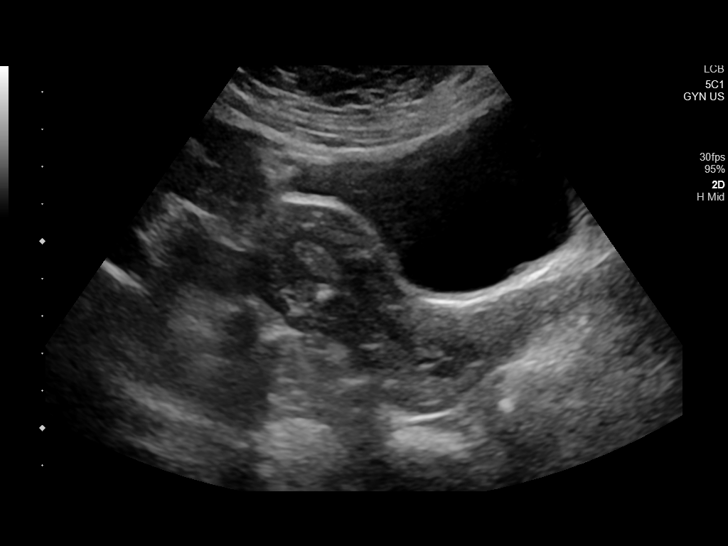
[im 26/77]
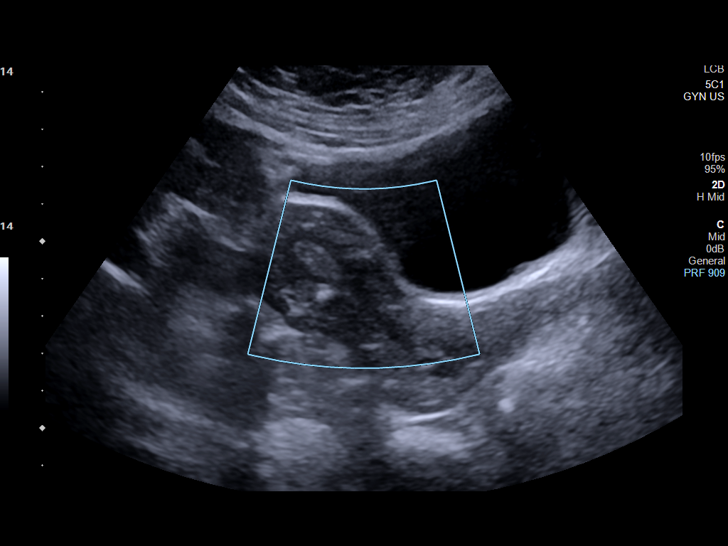
[im 32/77]
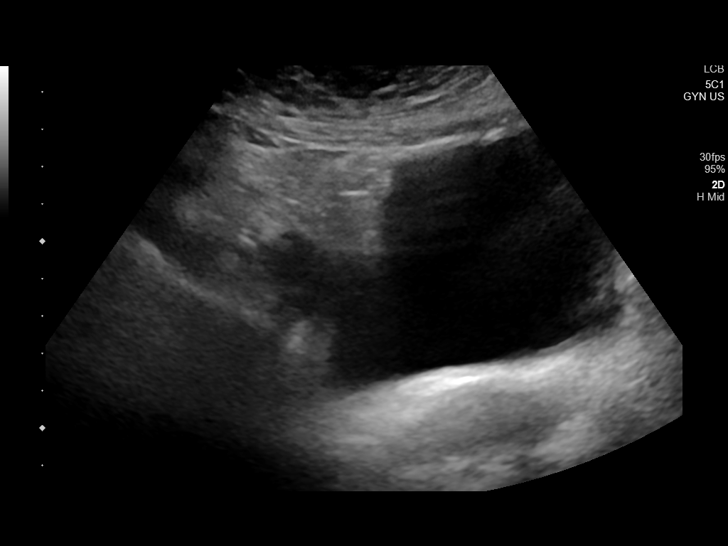
[im 39/77]
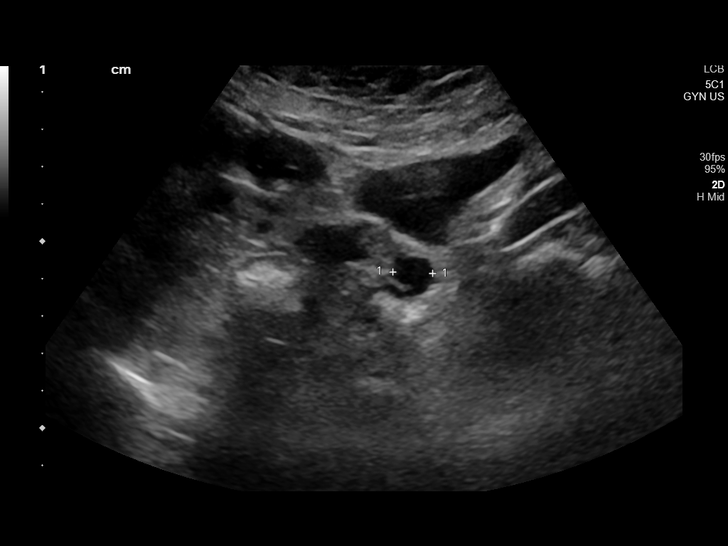
[im 45/77]
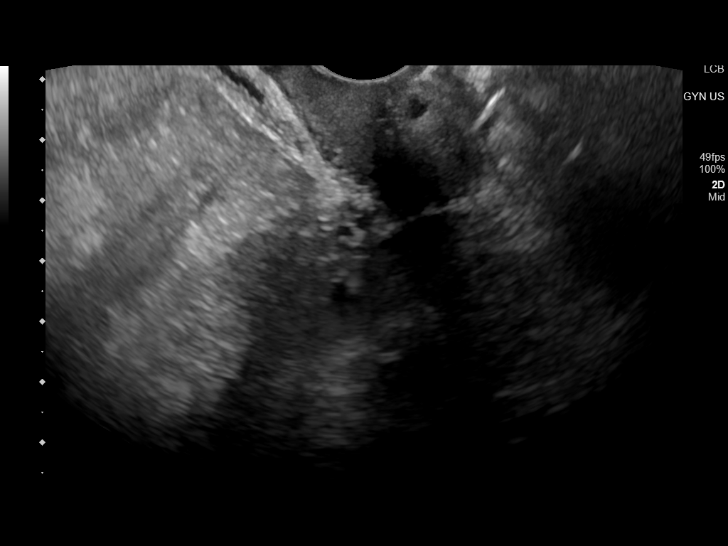
[im 51/77]
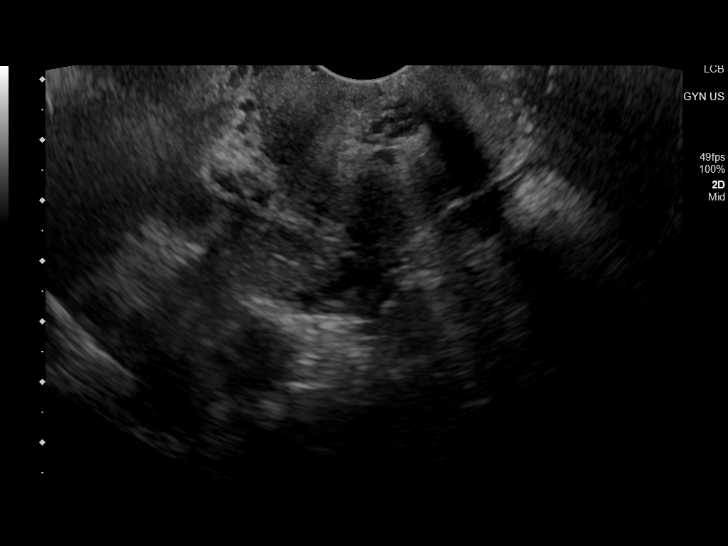
[im 58/77]
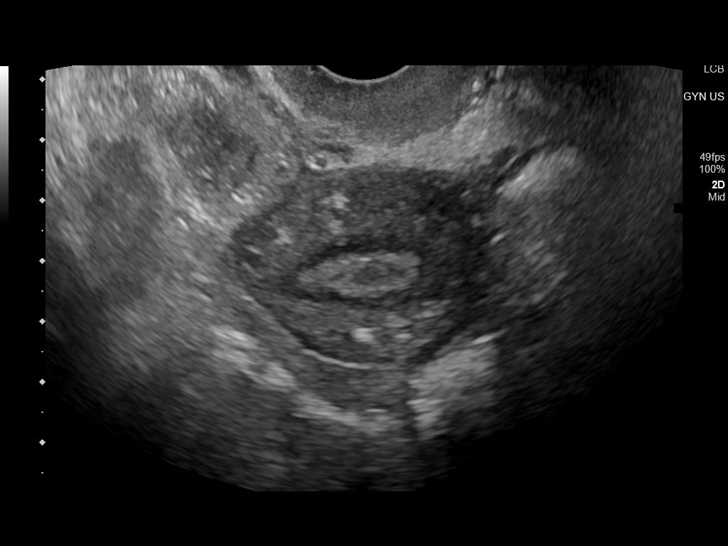
[im 64/77]
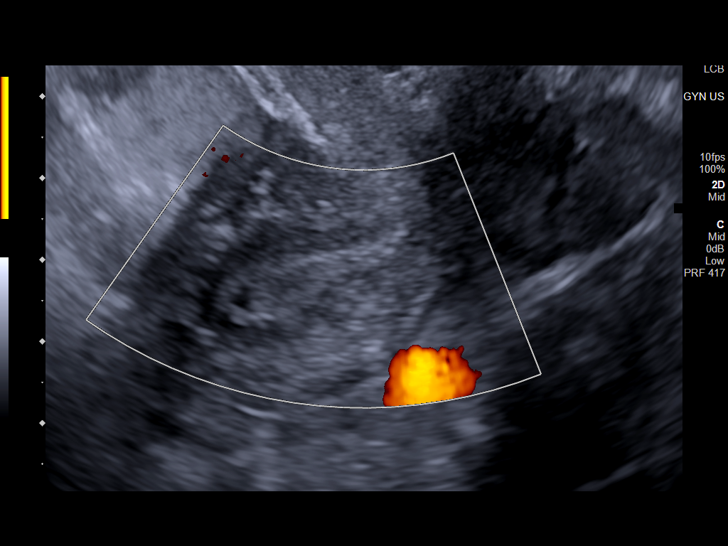
[im 70/77]
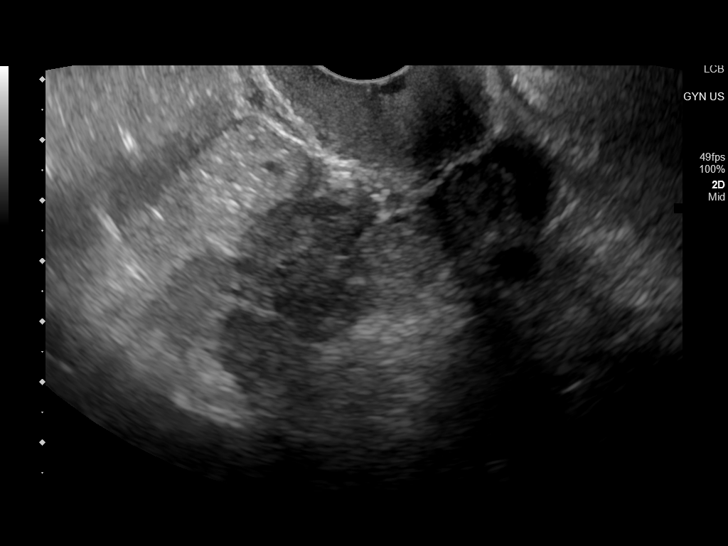
[im 77/77]
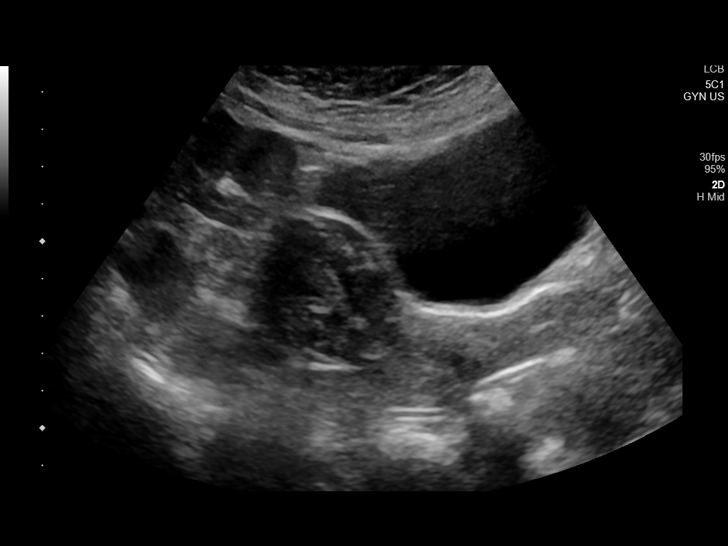

[13 of 25 positions shown; findings below may reference images not displayed]

FINDINGS: Uterus

Measurements: 6.3 x 3.4 x 4.1 cm = volume: 46 mL. 1 cm calcified
fibroid is seen in the posterior uterine corpus.

Endometrium

Thickness: 11 mm.  No focal abnormality visualized.

Right ovary

Measurements: Not visualized, however no adnexal mass identified.

Left ovary

Measurements: 1.8 x 1.5 x 1.5 cm (volume = 2.1 cm^3). 1 cm
benign-appearing left ovarian cyst noted. No imaging follow-up
recommended.

Other findings

No abnormal free fluid.
IMPRESSION: Abnormal endometrial thickening measuring 11 mm. In the setting of
post-menopausal bleeding, endometrial sampling is indicated to
exclude carcinoma. If results are benign, sonohysterogram should be
considered for focal lesion work-up. (Ref: Radiological Reasoning:
Algorithmic Workup of Abnormal Vaginal Bleeding with Endovaginal
Sonography and Sonohysterography. AJR 3446; 191:S68-73)

1 cm calcified uterine fibroid.

## 2021-08-01 ENCOUNTER — Other Ambulatory Visit (HOSPITAL_COMMUNITY): Payer: Self-pay | Admitting: Nephrology

## 2021-08-01 ENCOUNTER — Other Ambulatory Visit: Payer: Self-pay | Admitting: Nephrology

## 2021-08-02 ENCOUNTER — Other Ambulatory Visit (HOSPITAL_COMMUNITY): Payer: Self-pay | Admitting: Nephrology

## 2021-08-02 ENCOUNTER — Other Ambulatory Visit (HOSPITAL_BASED_OUTPATIENT_CLINIC_OR_DEPARTMENT_OTHER): Payer: Self-pay | Admitting: Nephrology

## 2021-08-02 ENCOUNTER — Encounter (INDEPENDENT_AMBULATORY_CARE_PROVIDER_SITE_OTHER): Payer: Self-pay | Admitting: *Deleted

## 2021-08-02 DIAGNOSIS — Z940 Kidney transplant status: Secondary | ICD-10-CM

## 2021-08-02 DIAGNOSIS — R634 Abnormal weight loss: Secondary | ICD-10-CM

## 2021-08-02 DIAGNOSIS — R1084 Generalized abdominal pain: Secondary | ICD-10-CM

## 2021-08-02 DIAGNOSIS — I10 Essential (primary) hypertension: Secondary | ICD-10-CM

## 2021-09-05 ENCOUNTER — Other Ambulatory Visit: Payer: Self-pay

## 2021-09-05 ENCOUNTER — Encounter (INDEPENDENT_AMBULATORY_CARE_PROVIDER_SITE_OTHER): Payer: Self-pay | Admitting: Gastroenterology

## 2021-09-05 ENCOUNTER — Ambulatory Visit (INDEPENDENT_AMBULATORY_CARE_PROVIDER_SITE_OTHER): Payer: Medicare HMO | Admitting: Gastroenterology

## 2021-09-05 VITALS — BP 164/81 | HR 71 | Temp 97.9°F | Ht 62.0 in | Wt 108.2 lb

## 2021-09-05 DIAGNOSIS — R634 Abnormal weight loss: Secondary | ICD-10-CM | POA: Diagnosis not present

## 2021-09-05 DIAGNOSIS — R197 Diarrhea, unspecified: Secondary | ICD-10-CM | POA: Diagnosis not present

## 2021-09-05 DIAGNOSIS — R1084 Generalized abdominal pain: Secondary | ICD-10-CM | POA: Diagnosis not present

## 2021-09-05 NOTE — Progress Notes (Signed)
Referring Provider: Asencion Noble, MD Primary Care Physician:  Asencion Noble, MD Primary GI Physician: Rehman  Chief Complaint  Patient presents with   Abdominal Pain    Occasional abdominal pain in mid abdomen. Has not had any pain since October.    HPI:   Kristine [name] is a 80 y.o. female with past medical history of kidney disease, s/p kidney transplant, HTN, hyperlipemia, hyperparathyroidism.   Patient presenting today for weight loss, abdominal pain and diarrhea.   Patient reports that she had a couple of episodes of abdominal pain in October, states that she mentioned it to her PCP who felt she needed to see GI. PCP ordered a CT A/P w/wo contrast, but this has not been completed. She states that episodes of diffuse abdominal pain started prior to bedtime and continued through the night, however, pain was not severe, but, she was aware that it was present, mildly sharp in nature. She reports that she has had a total of 2-3 episodes like this, all occurring in the evening and through the night, states that upon waking, pain had subsided. She denies any constipation, nausea, vomiting. She does endorses some diarrhea upon waking, she reports that stools are usually liquid then followed by solid stools that she states are shaped differently than her baseline stool, however, she reports they are not extremely thin in nature. She thinks diarrhea began maybe in august or September, she will typically only have one episode of diarrhea per day without any abdominal pain or cramping. She denies blood in stools or black stools. She has not needed to take anything for the pain. She states that over the past year she has had a decrease in her appetite, though when she does feel hungry, she eats well with no issues, denies early satiety. She reports her baseline weight was around 120, she was 108 today and reports that she has been around this weight for the past year. She denies any postprandial abdominal pain,  heart burn or acid reflux. She has not had any changes in her medications. she has not experienced any episodes of stool incontinence.   Last Colonoscopy:07/14/20- Perianal skin tags found on perianal exam. - Melanosis in the colon. - Diverticulosis in the sigmoid colon. - External hemorrhoids. - No specimens collected. Last Endoscopy:n/a   Past Medical History:  Diagnosis Date   Chronic kidney disease    had kidney transplant    Past Surgical History:  Procedure Laterality Date   CHOLECYSTECTOMY     COLONOSCOPY  08/28/2012   Procedure: COLONOSCOPY;  Surgeon: Rogene Houston, MD;  [facility];  Service: Endoscopy;  Laterality: N/A;  930   COLONOSCOPY WITH PROPOFOL N/A 07/14/2020   Procedure: COLONOSCOPY WITH PROPOFOL;  Surgeon: Rogene Houston, MD;  [facility];  Service: Endoscopy;  Laterality: N/A;  930   HYSTEROSCOPY WITH D & C N/A 01/11/2021   Procedure: DILATATION AND CURETTAGE /HYSTEROSCOPY;  Surgeon: [name], MD;  Location: [facility];  Service: Gynecology;  Laterality: N/A;   KIDNEY TRANSPLANT     orif left wrist     TONSILLECTOMY      Current Outpatient Medications  Medication Sig Dispense Refill   aspirin EC 81 MG tablet Take 81 mg by mouth daily.     Cholecalciferol (VITAMIN D) 2000 UNITS CAPS Take 2,000 Units by mouth daily.      estradiol (ESTRACE VAGINAL) 0.1 MG/GM vaginal cream Use 0.5 gm in vagina 2-3 x weekly 42.5 g  3   mycophenolate (MYFORTIC) 180 MG EC tablet Take 540 mg by mouth 2 (two) times daily.     simvastatin (ZOCOR) 20 MG tablet Take 20 mg by mouth at bedtime.      tacrolimus (PROGRAF) 0.5 MG capsule Take 0.5 mg by mouth 2 (two) times daily.      amLODipine (NORVASC) 2.5 MG tablet Take 2.5 mg by mouth at bedtime.      No current facility-administered medications for this visit.    Allergies as of 09/05/2021   (No Known Allergies)    History reviewed. No pertinent family history.  Social History   Socioeconomic  History   Marital status: Married    Spouse name: Not on file   Number of children: Not on file   Years of education: Not on file   Highest education level: Not on file  Occupational History   Not on file  Tobacco Use   Smoking status: Former   Smokeless tobacco: Never   Tobacco comments:    quit in 1977  Vaping Use   Vaping Use: Never used  Substance and Sexual Activity   Alcohol use: No   Drug use: No   Sexual activity: Yes    Birth control/protection: Post-menopausal  Other Topics Concern   Not on file  Social History Narrative   Not on file   Social Determinants of Health   Financial Resource Strain: Low Risk    Difficulty of Paying Living Expenses: Not hard at all  Food Insecurity: No Food Insecurity   Worried About Charity fundraiser in the Last Year: Never true   [place] in the Last Year: Never true  Transportation Needs: No Transportation Needs   Lack of Transportation (Medical): No   Lack of Transportation (Non-Medical): No  Physical Activity: Insufficiently Active   Days of Exercise per Week: 2 days   Minutes of Exercise per Session: 20 min  Stress: No Stress Concern Present   Feeling of Stress : Not at all  Social Connections: Socially Integrated   Frequency of Communication with Friends and Family: More than three times a week   Frequency of Social Gatherings with Friends and Family: Three times a week   Attends Religious Services: More than 4 times per year   Active Member of Clubs or Organizations: Yes   Attends Music therapist: More than 4 times per year   Marital Status: Married   Review of systems General: negative for malaise, night sweats, fever, chills, weight loss Neck: Negative for lumps, goiter, pain and significant neck swelling Resp: Negative for cough, wheezing, dyspnea at rest CV: Negative for chest pain, leg swelling, palpitations, orthopnea GI: denies melena, hematochezia, nausea, vomiting, constipation,  dysphagia, odyonophagia, early satiety or unintentional weight loss. +diarrhea +diffuse abdominal pain MSK: Negative for joint pain or swelling, back pain, and muscle pain. Derm: Negative for itching or rash Psych: Denies depression, anxiety, memory loss, confusion. No homicidal or suicidal ideation.  Heme: Negative for prolonged bleeding, bruising easily, and swollen nodes. Endocrine: Negative for cold or heat intolerance, polyuria, polydipsia and goiter. Neuro: negative for tremor, gait imbalance, syncope and seizures. The remainder of the review of systems is noncontributory.  Physical Exam: BP (!) 164/81 (BP Location: Right Arm, Patient Position: Sitting, Cuff Size: Normal)   Pulse 71   Temp 97.9 F (36.6 C) (Oral)   Ht 5\' 2"  (1.575 m)   Wt 108 lb 3.2 oz (49.1 kg)   BMI 19.79 kg/m  General:   Alert and oriented. No distress noted. Pleasant and cooperative.  Head:  Normocephalic and atraumatic. Eyes:  Conjuctiva clear without scleral icterus. Mouth:  Oral mucosa pink and moist. Good dentition. No lesions. Heart: Normal rate and rhythm, s1 and s2 heart sounds present.  Lungs: Clear lung sounds in all lobes. Respirations equal and unlabored. Abdomen:  +BS, soft, non-tender and non-distended. No rebound or guarding. No HSM or masses noted. Derm: No palmar erythema or jaundice Msk:  Symmetrical without gross deformities. Normal posture. Extremities:  Without edema. Neurologic:  Alert and  oriented x4 Psych:  Alert and cooperative. Normal mood and affect.  Invalid input(s): 6 MONTHS   ASSESSMENT: Kristine [name] is a 80 y.o. female presenting today for 2-3 episodes of diffuse abdominal pain occurring in October as well as diarrhea that began in august or September. She has had some weight loss over the past year, referred here by PCP for further evaluation of her symptoms.   Reassuringly, patient has no red flag symptoms and had Normal Colonoscopy in sept 2021. 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 She has one episode of diarrhea per day starting as liquid stools and ending with large volume formed stools. She is concerned that stools have changed shape, however, she denies any pencil thin stools. She denies rectal bleeding or black stools. She has lost about 12 pounds over the past year, however, she reports that she often does not feel hungry however, when she does get hungry, she eats well without issue. She has no postprandial abdominal pain. No fevers, chills, nausea, vomiting, constipation or other recent illness, no changes in medications. PCP ordered a CT A/P w/wo contrast, however, this has never been scheduled. I do not think we need to repeat her colonoscopy at this time, given how recent her last one was. CT A/P will likely not show much in regards to her symptoms. Given she is having only one episode of diarrhea per day, we will hold off on stool studies. I will obtain labs from PCP to check for reversible causes of diarrhea, if TSH and CMP were not included in most recent lab work, I will order these. Patient declined needing any medication for abdominal discomfort/diarrhea a this time as she has had no further episodes of abdominal pain since October. I provided her with low FODMAP diet to try and she should keep food diary to see if her diarrhea is worsened with certain foods. As for her weight loss, this has been gradual over the past year. I suspect this could be age related changes but we will continue to monitor for more weight loss. She will let me know if she develops any new or worsening GI symptoms.    PLAN:  Obtain labs from PCP (may need to check TSH, CMP) 2. Keep food diary 3. Low FODMAP diet 4. Declined meds at this time 5. Hold off on CT A/P and stool studies for now  Follow Up: 3 months  Cadience Bradfield L. Alver Sorrow, MSN, APRN, AGNP-C Adult-Gerontology Nurse Practitioner [facility]

## 2021-09-05 NOTE — Patient Instructions (Signed)
I will obtain your most recent labs from Dr. Joelyn Oms, if they did not draw what I am looking for, I will be in touch with you.  I suspect you may be experiencing some IBS, you had a normal colonoscopy 1 year ago which is very reassuring.  I have attached the low FODMAP diet, you can try this and also keep a food diary, to see if you notice worsening diarrhea or abdominal pain with certain foods.  Please let me know if diarrhea worsens or you begin having abdominal pain again, or if you develop more weight loss, blood in stools, black stools or pain in your abdomen after eating  Follow up 3 months

## 2021-10-20 ENCOUNTER — Telehealth: Payer: Self-pay | Admitting: Adult Health

## 2021-10-20 ENCOUNTER — Other Ambulatory Visit: Payer: Self-pay | Admitting: *Deleted

## 2021-10-20 MED ORDER — ESTRADIOL 0.1 MG/GM VA CREA: TOPICAL_CREAM | VAGINAL | 3 refills | Status: DC

## 2021-10-20 NOTE — Telephone Encounter (Signed)
Patient is calling asking if we have anymore samples of the estradiol cream and if not please send script to [facility] on [address]

## 2021-10-21 NOTE — Telephone Encounter (Signed)
Rx sent in by [facility] per patient request.

## 2021-10-28 DIAGNOSIS — G3184 Mild cognitive impairment, so stated: Secondary | ICD-10-CM | POA: Diagnosis not present

## 2021-10-28 DIAGNOSIS — Z940 Kidney transplant status: Secondary | ICD-10-CM | POA: Diagnosis not present

## 2021-10-28 DIAGNOSIS — R41840 Attention and concentration deficit: Secondary | ICD-10-CM | POA: Diagnosis not present

## 2021-10-28 DIAGNOSIS — R634 Abnormal weight loss: Secondary | ICD-10-CM | POA: Diagnosis not present

## 2021-10-30 DIAGNOSIS — F0150 Vascular dementia without behavioral disturbance: Secondary | ICD-10-CM | POA: Insufficient documentation

## 2021-11-03 DIAGNOSIS — G3184 Mild cognitive impairment, so stated: Secondary | ICD-10-CM | POA: Diagnosis not present

## 2021-11-03 DIAGNOSIS — R413 Other amnesia: Secondary | ICD-10-CM | POA: Diagnosis not present

## 2021-11-07 DIAGNOSIS — C44311 Basal cell carcinoma of skin of nose: Secondary | ICD-10-CM | POA: Diagnosis not present

## 2021-11-07 DIAGNOSIS — C4491 Basal cell carcinoma of skin, unspecified: Secondary | ICD-10-CM | POA: Diagnosis not present

## 2021-11-07 DIAGNOSIS — Z481 Encounter for planned postprocedural wound closure: Secondary | ICD-10-CM | POA: Diagnosis not present

## 2021-11-09 DIAGNOSIS — Z79899 Other long term (current) drug therapy: Secondary | ICD-10-CM | POA: Diagnosis not present

## 2021-11-09 DIAGNOSIS — Z48298 Encounter for aftercare following other organ transplant: Secondary | ICD-10-CM | POA: Diagnosis not present

## 2021-11-09 DIAGNOSIS — R634 Abnormal weight loss: Secondary | ICD-10-CM | POA: Diagnosis not present

## 2021-11-09 DIAGNOSIS — G3184 Mild cognitive impairment, so stated: Secondary | ICD-10-CM | POA: Diagnosis not present

## 2021-11-09 DIAGNOSIS — Z940 Kidney transplant status: Secondary | ICD-10-CM | POA: Diagnosis not present

## 2021-11-09 DIAGNOSIS — N390 Urinary tract infection, site not specified: Secondary | ICD-10-CM | POA: Diagnosis not present

## 2021-11-09 DIAGNOSIS — Z23 Encounter for immunization: Secondary | ICD-10-CM | POA: Diagnosis not present

## 2021-11-09 DIAGNOSIS — E782 Mixed hyperlipidemia: Secondary | ICD-10-CM | POA: Diagnosis not present

## 2021-11-09 DIAGNOSIS — D849 Immunodeficiency, unspecified: Secondary | ICD-10-CM | POA: Diagnosis not present

## 2021-11-09 DIAGNOSIS — Z7982 Long term (current) use of aspirin: Secondary | ICD-10-CM | POA: Diagnosis not present

## 2021-11-09 DIAGNOSIS — I10 Essential (primary) hypertension: Secondary | ICD-10-CM | POA: Diagnosis not present

## 2021-11-09 DIAGNOSIS — E785 Hyperlipidemia, unspecified: Secondary | ICD-10-CM | POA: Diagnosis not present

## 2021-11-21 DIAGNOSIS — C44311 Basal cell carcinoma of skin of nose: Secondary | ICD-10-CM | POA: Diagnosis not present

## 2021-11-21 DIAGNOSIS — Z481 Encounter for planned postprocedural wound closure: Secondary | ICD-10-CM | POA: Diagnosis not present

## 2021-11-21 DIAGNOSIS — C44722 Squamous cell carcinoma of skin of right lower limb, including hip: Secondary | ICD-10-CM | POA: Diagnosis not present

## 2021-11-21 DIAGNOSIS — C4491 Basal cell carcinoma of skin, unspecified: Secondary | ICD-10-CM | POA: Diagnosis not present

## 2021-11-21 DIAGNOSIS — D485 Neoplasm of uncertain behavior of skin: Secondary | ICD-10-CM | POA: Diagnosis not present

## 2021-11-24 DIAGNOSIS — R413 Other amnesia: Secondary | ICD-10-CM | POA: Diagnosis not present

## 2021-11-24 DIAGNOSIS — E0500 Thyrotoxicosis with diffuse goiter without thyrotoxic crisis or storm: Secondary | ICD-10-CM | POA: Diagnosis not present

## 2021-11-24 DIAGNOSIS — F0150 Vascular dementia without behavioral disturbance: Secondary | ICD-10-CM | POA: Diagnosis not present

## 2021-12-02 DIAGNOSIS — F01A0 Vascular dementia, mild, without behavioral disturbance, psychotic disturbance, mood disturbance, and anxiety: Secondary | ICD-10-CM | POA: Diagnosis not present

## 2021-12-02 DIAGNOSIS — F0390 Unspecified dementia without behavioral disturbance: Secondary | ICD-10-CM | POA: Diagnosis not present

## 2021-12-02 DIAGNOSIS — R630 Anorexia: Secondary | ICD-10-CM | POA: Diagnosis not present

## 2021-12-02 DIAGNOSIS — R634 Abnormal weight loss: Secondary | ICD-10-CM | POA: Diagnosis not present

## 2021-12-19 DIAGNOSIS — I872 Venous insufficiency (chronic) (peripheral): Secondary | ICD-10-CM | POA: Diagnosis not present

## 2021-12-19 DIAGNOSIS — C44722 Squamous cell carcinoma of skin of right lower limb, including hip: Secondary | ICD-10-CM | POA: Diagnosis not present

## 2021-12-22 ENCOUNTER — Ambulatory Visit (INDEPENDENT_AMBULATORY_CARE_PROVIDER_SITE_OTHER): Payer: BC Managed Care – PPO | Admitting: Gastroenterology

## 2021-12-27 DIAGNOSIS — Z4801 Encounter for change or removal of surgical wound dressing: Secondary | ICD-10-CM | POA: Diagnosis not present

## 2021-12-27 DIAGNOSIS — Z48817 Encounter for surgical aftercare following surgery on the skin and subcutaneous tissue: Secondary | ICD-10-CM | POA: Diagnosis not present

## 2022-01-02 DIAGNOSIS — K5900 Constipation, unspecified: Secondary | ICD-10-CM | POA: Diagnosis not present

## 2022-01-02 DIAGNOSIS — F0150 Vascular dementia without behavioral disturbance: Secondary | ICD-10-CM | POA: Diagnosis not present

## 2022-01-30 DIAGNOSIS — F01A0 Vascular dementia, mild, without behavioral disturbance, psychotic disturbance, mood disturbance, and anxiety: Secondary | ICD-10-CM | POA: Diagnosis not present

## 2022-01-30 DIAGNOSIS — Z6820 Body mass index (BMI) 20.0-20.9, adult: Secondary | ICD-10-CM | POA: Diagnosis not present

## 2022-01-30 DIAGNOSIS — R630 Anorexia: Secondary | ICD-10-CM | POA: Diagnosis not present

## 2022-02-01 DIAGNOSIS — I10 Essential (primary) hypertension: Secondary | ICD-10-CM | POA: Diagnosis not present

## 2022-02-01 DIAGNOSIS — F0150 Vascular dementia without behavioral disturbance: Secondary | ICD-10-CM | POA: Diagnosis not present

## 2022-02-01 DIAGNOSIS — K5900 Constipation, unspecified: Secondary | ICD-10-CM | POA: Diagnosis not present

## 2022-02-13 ENCOUNTER — Ambulatory Visit (INDEPENDENT_AMBULATORY_CARE_PROVIDER_SITE_OTHER): Payer: BC Managed Care – PPO | Admitting: Gastroenterology

## 2022-02-18 ENCOUNTER — Ambulatory Visit
Admission: EM | Admit: 2022-02-18 | Discharge: 2022-02-18 | Disposition: A | Discharge status: Home / Self Care | Payer: PPO | Attending: Nurse Practitioner | Admitting: Nurse Practitioner

## 2022-02-18 DIAGNOSIS — S01112A Laceration without foreign body of left eyelid and periocular area, initial encounter: Secondary | ICD-10-CM

## 2022-02-18 DIAGNOSIS — S0181XA Laceration without foreign body of other part of head, initial encounter: Secondary | ICD-10-CM

## 2022-02-18 NOTE — ED Provider Notes (Signed)
?[place] ? ? ? ?CSN: [id number] ?Arrival date & time: 02/18/22  1157 ? ? ?  ? ?History   ?Chief Complaint ?Chief Complaint  ?Patient presents with  ? Fall  ? Laceration  ? ? ?HPI ?Kristine [name] is a 81 y.o. female.  ? ?The patient is an 81 year old female who presents with her spouse after she fell out of bed this morning.  Patient presents with a laceration to her right and left forehead regions.  She denies loss of consciousness, change in vision, blurred vision, headache, dizziness, or lightheadedness.  Patient aspirin daily, no other blood thinners.  She reports that she has had her last tetanus shot within the last 2 to 3 years.  Denies any other injury at this time. ? ?The history is provided by the patient and the spouse.  ?Laceration ? ?Past Medical History:  ?Diagnosis Date  ? Chronic kidney disease   ? had kidney transplant  ? ? ?Patient Active Problem List  ? Diagnosis Date Noted  ? Loss of weight 09/05/2021  ? Generalized abdominal pain 09/05/2021  ? Vaginal atrophy 03/22/2021  ? Symptoms of urinary tract infection 03/22/2021  ? History of kidney transplant 03/22/2021  ? Endometrial hyperplasia without atypia 01/19/2021  ? PMB (postmenopausal bleeding)   ? Abnormal endometrial ultrasound   ? Cervical stenosis (uterine cervix)   ? Tubular adenoma 07/22/2012  ? DIARRHEA 12/27/2009  ? 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  ? ? ?Past Surgical History:  ?Procedure Laterality Date  ? CHOLECYSTECTOMY    ? COLONOSCOPY  08/28/2012  ? Procedure: COLONOSCOPY;  Surgeon: Rogene Houston, MD;  [facility];  Service: Endoscopy;  Laterality: N/A;  930  ? COLONOSCOPY WITH PROPOFOL N/A 07/14/2020  ? Procedure: COLONOSCOPY WITH PROPOFOL;  Surgeon: Rogene Houston, MD;  [facility];  Service: Endoscopy;  Laterality: N/A;  930  ? HYSTEROSCOPY WITH D & C N/A 01/11/2021  ? Procedure: DILATATION AND CURETTAGE /HYSTEROSCOPY;  Surgeon: [name], MD;  Location: [facility];  Service:  Gynecology;  Laterality: N/A;  ? KIDNEY TRANSPLANT    ? orif left wrist    ? TONSILLECTOMY    ? ? ?OB History   ? ? Gravida  ?2  ? Para  ?2  ? Term  ?2  ? Preterm  ?   ? AB  ?   ? Living  ?2  ?  ? ? SAB  ?   ? IAB  ?   ? Ectopic  ?   ? Multiple  ?   ? Live Births  ?2  ?   ?  ?  ? ? ? ?Home Medications   ? ?Prior to Admission medications   ?Medication Sig Start Date End Date Taking? Authorizing Provider  ?amLODipine (NORVASC) 2.5 MG tablet Take 2.5 mg by mouth at bedtime.  03/02/20   [provider]  ?aspirin EC 81 MG tablet Take 81 mg by mouth daily.    [provider]  ?Cholecalciferol (VITAMIN D) 2000 UNITS CAPS Take 2,000 Units by mouth daily.     [provider]  ?estradiol (ESTRACE VAGINAL) 0.1 MG/GM vaginal cream Use 0.5 gm in vagina 2-3 x weekly 10/20/21   Derrek Monaco A, NP  ?mycophenolate (MYFORTIC) 180 MG EC tablet Take 540 mg by mouth 2 (two) times daily.    [provider]  ?simvastatin (ZOCOR) 20 MG tablet Take 20 mg by mouth at bedtime.     [provider]  ?tacrolimus (  PROGRAF) 0.5 MG capsule Take 0.5 mg by mouth 2 (two) times daily.     [provider]  ? ? ?Family History ?History reviewed. No pertinent family history. ? ?Social History ?Social History  ? ?Tobacco Use  ? Smoking status: Former  ? Smokeless tobacco: Never  ? Tobacco comments:  ?  quit in 1977  ?Vaping Use  ? Vaping Use: Never used  ?Substance Use Topics  ? Alcohol use: No  ? Drug use: No  ? ? ? ?Allergies   ?Patient has no known allergies. ? ? ?Review of Systems ?Review of Systems  ?Constitutional: Negative.   ?Skin:  Positive for wound (laceration to right and left forehead).  ?Psychiatric/Behavioral: Negative.    ? ? ?Physical Exam ?Triage Vital Signs ?ED Triage Vitals  ?Enc Vitals Group  ?   BP 02/18/22 1343 (!) 146/76  ?   Pulse Rate 02/18/22 1343 (!) 59  ?   Resp 02/18/22 1343 17  ?   Temp 02/18/22 1343 98.5 ?F (36.9 ?C)  ?   Temp Source 02/18/22 1343 Oral  ?   SpO2  02/18/22 1343 97 %  ?   Weight --   ?   Height --   ?   Head Circumference --   ?   Peak Flow --   ?   Pain Score 02/18/22 1348 0  ?   Pain Loc --   ?   Pain Edu? --   ?   Excl. in [place]? --   ? ?No data found. ? ?Updated Vital Signs ?BP (!) 146/76 (BP Location: Right Arm)   Pulse (!) 59   Temp 98.5 ?F (36.9 ?C) (Oral)   Resp 17   SpO2 97%  ? ?Visual Acuity ?Right Eye Distance:   ?Left Eye Distance:   ?Bilateral Distance:   ? ?Right Eye Near:   ?Left Eye Near:    ?Bilateral Near:    ? ?Physical Exam ?Vitals reviewed.  ?Constitutional:   ?   Appearance: Normal appearance.  ?HENT:  ?   Nose: Nose normal.  ?   Mouth/Throat:  ?   Mouth: Mucous membranes are moist.  ?Eyes:  ?   Extraocular Movements: Extraocular movements intact.  ?   Pupils: Pupils are equal, round, and reactive to light.  ?Cardiovascular:  ?   Rate and Rhythm: Normal rate and regular rhythm.  ?Pulmonary:  ?   Effort: Pulmonary effort is normal.  ?   Breath sounds: Normal breath sounds.  ?Abdominal:  ?   General: Bowel sounds are normal.  ?   Palpations: Abdomen is soft.  ?Skin: ?   General: Skin is warm and dry.  ?   Capillary Refill: Capillary refill takes less than 2 seconds.  ? ?    ?   Comments: 1 cm laceration noted to the right forehead.  1 cm laceration noted to the left eyebrow.  Areas were soaked with normal saline and cleansed with Hibiclens.  Moderate dried blood was in the lacerations upon presentation.  Upon removal of the blood, lacerations were superficial and able to be repaired with Dermabond. Bruising noted to the right forehead with swelling, induration surrounding the laceration. Bruising noted to the left eyebrow with induration present. Patient tolerated well.  ?Neurological:  ?   General: No focal deficit present.  ?   Mental Status: She is alert and oriented to person, place, and time.  ?   GCS: GCS eye subscore is 4. GCS verbal subscore is  5. GCS motor subscore is 6.  ?   Cranial Nerves: No cranial nerve deficit.  ?   Sensory:  No sensory deficit.  ?   Motor: No weakness.  ?   Coordination: Coordination normal.  ?   Gait: Gait normal.  ?Psychiatric:     ?   Mood and Affect: Mood normal.     ?   Behavior: Behavior normal.  ? ? ? ?UC Treatments / Results  ?Labs ?(all labs ordered are listed, but only abnormal results are displayed) ?Labs Reviewed - No data to display ? ?EKG ? ? ?Radiology ?No results found. ? ?Procedures ?Laceration Repair ? ?Date/Time: 02/18/2022 3:02 PM ?Performed by: Tish Men, NP ?Authorized by: Tish Men, NP  ? ?Consent:  ?  Consent obtained:  Verbal ?  Consent given by:  Patient ?  Risks, benefits, and alternatives were discussed: yes   ?  Risks discussed:  Infection and pain ?  Alternatives discussed:  No treatment ?Universal protocol:  ?  Patient identity confirmed:  Verbally with patient and arm band ?Anesthesia:  ?  Anesthesia method:  None ?Laceration details:  ?  Wound length (cm): 1 cm to right forehead and 1cm over left eyebrow. ?Treatment:  ?  Wound cleansed with: Hibiclens and normal saline. ?  Irrigation solution:  Tap water ?  Irrigation method:  Tap ?Skin repair:  ?  Repair method:  Tissue adhesive ?Approximation:  ?  Approximation:  Close ?Repair type:  ?  Repair type:  Simple ?Post-procedure details:  ?  Dressing:  Adhesive bandage ?  Procedure completion:  Tolerated ?Comments:  ?   1 cm to the left forehead and 1cm laceration to the left eyebrow. Areas cleansed with Hibiclens and NS. Dried blood was removed. Steristrips applied, patient tolerated well.  (including critical care time) ? ?Medications Ordered in UC ?Medications - No data to display ? ?Initial Impression / Assessment and Plan / UC Course  ?I have reviewed the triage vital signs and the nursing notes. ? ?Pertinent labs & imaging results that were available during my care of the patient were reviewed by me and considered in my medical decision making (see chart for details). ? ?The patient is an 81 year old female  who presents with her husband after a fall.  Patient rolled out of the bed this morning around 3 AM and presents with lacerations to the left eyebrow and right forehead.  The areas were covered with a bandage upon pr

## 2022-02-18 NOTE — ED Triage Notes (Signed)
Pt presents with laceration on the right sided forehead after she rolled over in her bed and fell on the floor around 0300 am today. Denies headache, vision changes, nausea, vomiting, feeling sleepy.  ?

## 2022-02-18 NOTE — ED Triage Notes (Signed)
PT A/O X4. No reports of LOC. Pt has a bandage on rt/lt side of fore head.  ?

## 2022-02-18 NOTE — Discharge Instructions (Signed)
Keep the area clean and dry.  Cleanse the area daily with warm water.  Do not rub or vigorously aggravate the area. ?Do not disrupt the glue to the as it was placed today. ?Monitor for signs of infection to include increased redness, pain, swelling, or drainage. ?May take Tylenol as needed for pain. ?Follow-up if symptoms worsen or do not improve. ? ?

## 2022-03-02 DIAGNOSIS — S0191XA Laceration without foreign body of unspecified part of head, initial encounter: Secondary | ICD-10-CM | POA: Diagnosis not present

## 2022-03-10 DIAGNOSIS — Z940 Kidney transplant status: Secondary | ICD-10-CM | POA: Diagnosis not present

## 2022-03-16 DIAGNOSIS — Z1231 Encounter for screening mammogram for malignant neoplasm of breast: Secondary | ICD-10-CM | POA: Diagnosis not present

## 2022-03-27 DIAGNOSIS — R922 Inconclusive mammogram: Secondary | ICD-10-CM | POA: Diagnosis not present

## 2022-03-27 DIAGNOSIS — R928 Other abnormal and inconclusive findings on diagnostic imaging of breast: Secondary | ICD-10-CM | POA: Diagnosis not present

## 2022-03-27 DIAGNOSIS — N6489 Other specified disorders of breast: Secondary | ICD-10-CM | POA: Diagnosis not present

## 2022-04-11 DIAGNOSIS — I10 Essential (primary) hypertension: Secondary | ICD-10-CM | POA: Diagnosis not present

## 2022-04-11 DIAGNOSIS — F0150 Vascular dementia without behavioral disturbance: Secondary | ICD-10-CM | POA: Diagnosis not present

## 2022-04-26 DIAGNOSIS — L821 Other seborrheic keratosis: Secondary | ICD-10-CM | POA: Diagnosis not present

## 2022-04-26 DIAGNOSIS — D1801 Hemangioma of skin and subcutaneous tissue: Secondary | ICD-10-CM | POA: Diagnosis not present

## 2022-04-26 DIAGNOSIS — Z08 Encounter for follow-up examination after completed treatment for malignant neoplasm: Secondary | ICD-10-CM | POA: Diagnosis not present

## 2022-04-26 DIAGNOSIS — Z85828 Personal history of other malignant neoplasm of skin: Secondary | ICD-10-CM | POA: Diagnosis not present

## 2022-04-26 DIAGNOSIS — L570 Actinic keratosis: Secondary | ICD-10-CM | POA: Diagnosis not present

## 2022-04-26 DIAGNOSIS — C44722 Squamous cell carcinoma of skin of right lower limb, including hip: Secondary | ICD-10-CM | POA: Diagnosis not present

## 2022-04-26 DIAGNOSIS — L578 Other skin changes due to chronic exposure to nonionizing radiation: Secondary | ICD-10-CM | POA: Diagnosis not present

## 2022-04-26 DIAGNOSIS — R229 Localized swelling, mass and lump, unspecified: Secondary | ICD-10-CM | POA: Diagnosis not present

## 2022-04-28 DIAGNOSIS — I10 Essential (primary) hypertension: Secondary | ICD-10-CM | POA: Diagnosis not present

## 2022-04-28 DIAGNOSIS — R197 Diarrhea, unspecified: Secondary | ICD-10-CM | POA: Diagnosis not present

## 2022-04-28 DIAGNOSIS — Z4822 Encounter for aftercare following kidney transplant: Secondary | ICD-10-CM | POA: Diagnosis not present

## 2022-04-28 DIAGNOSIS — E782 Mixed hyperlipidemia: Secondary | ICD-10-CM | POA: Diagnosis not present

## 2022-04-28 DIAGNOSIS — E785 Hyperlipidemia, unspecified: Secondary | ICD-10-CM | POA: Diagnosis not present

## 2022-04-28 DIAGNOSIS — Z7982 Long term (current) use of aspirin: Secondary | ICD-10-CM | POA: Diagnosis not present

## 2022-04-28 DIAGNOSIS — Z940 Kidney transplant status: Secondary | ICD-10-CM | POA: Diagnosis not present

## 2022-04-28 DIAGNOSIS — N390 Urinary tract infection, site not specified: Secondary | ICD-10-CM | POA: Diagnosis not present

## 2022-04-28 DIAGNOSIS — N950 Postmenopausal bleeding: Secondary | ICD-10-CM | POA: Diagnosis not present

## 2022-04-28 DIAGNOSIS — R413 Other amnesia: Secondary | ICD-10-CM | POA: Diagnosis not present

## 2022-04-28 DIAGNOSIS — Z48298 Encounter for aftercare following other organ transplant: Secondary | ICD-10-CM | POA: Diagnosis not present

## 2022-04-28 DIAGNOSIS — D849 Immunodeficiency, unspecified: Secondary | ICD-10-CM | POA: Diagnosis not present

## 2022-05-01 DIAGNOSIS — R634 Abnormal weight loss: Secondary | ICD-10-CM | POA: Diagnosis not present

## 2022-05-01 DIAGNOSIS — F0390 Unspecified dementia without behavioral disturbance: Secondary | ICD-10-CM | POA: Diagnosis not present

## 2022-05-01 DIAGNOSIS — R630 Anorexia: Secondary | ICD-10-CM | POA: Diagnosis not present

## 2022-05-01 DIAGNOSIS — F0150 Vascular dementia without behavioral disturbance: Secondary | ICD-10-CM | POA: Diagnosis not present

## 2022-06-20 ENCOUNTER — Encounter: Payer: Self-pay | Admitting: Emergency Medicine

## 2022-06-20 ENCOUNTER — Ambulatory Visit
Admission: EM | Admit: 2022-06-20 | Discharge: 2022-06-20 | Disposition: A | Discharge status: Home / Self Care | Payer: PPO | Attending: Nurse Practitioner | Admitting: Nurse Practitioner

## 2022-06-20 ENCOUNTER — Ambulatory Visit (INDEPENDENT_AMBULATORY_CARE_PROVIDER_SITE_OTHER): Payer: PPO

## 2022-06-20 DIAGNOSIS — H1033 Unspecified acute conjunctivitis, bilateral: Secondary | ICD-10-CM | POA: Diagnosis not present

## 2022-06-20 DIAGNOSIS — R0602 Shortness of breath: Secondary | ICD-10-CM

## 2022-06-20 DIAGNOSIS — R059 Cough, unspecified: Secondary | ICD-10-CM

## 2022-06-20 DIAGNOSIS — R509 Fever, unspecified: Secondary | ICD-10-CM | POA: Diagnosis not present

## 2022-06-20 DIAGNOSIS — R410 Disorientation, unspecified: Secondary | ICD-10-CM | POA: Diagnosis not present

## 2022-06-20 DIAGNOSIS — J069 Acute upper respiratory infection, unspecified: Secondary | ICD-10-CM

## 2022-06-20 MED ORDER — POLYMYXIN B-TRIMETHOPRIM 10000-0.1 UNIT/ML-% OP SOLN: 1.0000 [drp] | Freq: Four times a day (QID) | OPHTHALMIC | 0 refills | Status: AC

## 2022-06-20 MED ORDER — FLUTICASONE PROPIONATE 50 MCG/ACT NA SUSP: 2.0000 | Freq: Every day | NASAL | 0 refills | Status: DC

## 2022-06-20 MED ORDER — BENZONATATE 100 MG PO CAPS: 100.0000 mg | ORAL_CAPSULE | Freq: Three times a day (TID) | ORAL | 0 refills | Status: AC | PRN

## 2022-06-20 NOTE — ED Provider Notes (Signed)
[facility]    CSN: [id number] Arrival date & time: 06/20/22  1340      History   Chief Complaint No chief complaint on file.   HPI Kristine [name] is a 81 y.o. female.   The history is provided by the patient.   Patient brought in by her husband for complaints of cough, fever, nasal congestion.  Symptoms have been present for the past 4 days.  Patient states that her cough is worse at night.  Patient's husband informs that patient is unable to expectorate the sputum.  Patient's fever this morning was around 100.5 per the husband's report.  Patient also reports that she has experienced intermittent shortness of breath.  She denies headache, sore throat, ear pain, wheezing, chest pain, palpitations, abdominal pain, nausea, vomiting, or diarrhea.  Patient also with drainage of both her eyes that started this morning upon awakening.  Patient's husband states the patient's eyes were matted this morning and she had difficulty opening them.  Patient has not been taking any medication for her symptoms.  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  Patient states patient took a home COVID test this morning, which was negative.  Past Medical History:  Diagnosis Date   Chronic kidney disease    had kidney transplant    Patient Active Problem List   Diagnosis Date Noted   Loss of weight 09/05/2021   Generalized abdominal pain 09/05/2021   Vaginal atrophy 03/22/2021   Symptoms of urinary tract infection 03/22/2021   History of kidney transplant 03/22/2021   Endometrial hyperplasia without atypia 01/19/2021   PMB (postmenopausal bleeding)    Abnormal endometrial ultrasound    Cervical stenosis (uterine cervix)    Tubular adenoma 07/22/2012   DIARRHEA 12/27/2009   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    Past Surgical History:  Procedure Laterality Date   CHOLECYSTECTOMY     COLONOSCOPY  08/28/2012   Procedure: COLONOSCOPY;  Surgeon: Rogene Houston, MD;  Location: [facility];  Service: Endoscopy;  Laterality: N/A;  930   COLONOSCOPY WITH PROPOFOL N/A 07/14/2020   Procedure: COLONOSCOPY WITH PROPOFOL;  Surgeon: Rogene Houston, MD;  Location: [facility];  Service: Endoscopy;  Laterality: N/A;  930   HYSTEROSCOPY WITH D & C N/A 01/11/2021   Procedure: DILATATION AND CURETTAGE /HYSTEROSCOPY;  Surgeon: [name], MD;  Location: [facility];  Service: Gynecology;  Laterality: N/A;   KIDNEY TRANSPLANT     orif left wrist     TONSILLECTOMY      OB History     Gravida  2   Para  2   Term  2   Preterm      AB      Living  2      SAB      IAB      Ectopic      Multiple      Live Births  2            Home Medications    Prior to Admission medications   Medication Sig Start Date End Date Taking? Authorizing Provider  benzonatate (TESSALON PERLES) 100 MG capsule Take 1 capsule (100 mg total) by mouth 3 (three) times daily as needed for up to 10 days for cough. 06/20/22 06/30/22 Yes Augustin Bun-Warren, Alda Lea, NP  fluticasone (FLONASE) 50 MCG/ACT nasal spray Place 2 sprays into both nostrils daily. 06/20/22  Yes Ted Goodner-Warren, Alda Lea, NP  trimethoprim-polymyxin b (POLYTRIM) ophthalmic solution Place 1  drop into both eyes every 6 (six) hours for 7 days. 06/20/22 06/27/22 Yes Ikeem Cleckler-Warren, Alda Lea, NP  amLODipine (NORVASC) 2.5 MG tablet Take 2.5 mg by mouth at bedtime.  03/02/20   [provider]  aspirin EC 81 MG tablet Take 81 mg by mouth daily.    [provider]  Cholecalciferol (VITAMIN D) 2000 UNITS CAPS Take 2,000 Units by mouth daily.     [provider]  estradiol (ESTRACE VAGINAL) 0.1 MG/GM vaginal cream Use 0.5 gm in vagina 2-3 x weekly 10/20/21   Derrek Monaco A, NP  mycophenolate (MYFORTIC) 180 MG EC tablet Take 540 mg by mouth 2 (two) times daily.    [provider]  simvastatin (ZOCOR) 20 MG tablet Take 20 mg by mouth at bedtime.     [provider]  tacrolimus (PROGRAF)  0.5 MG capsule Take 0.5 mg by mouth 2 (two) times daily.     [provider]    Family History History reviewed. No pertinent family history.  Social History Social History   Tobacco Use   Smoking status: Former   Smokeless tobacco: Never   Tobacco comments:    quit in 1977  Vaping Use   Vaping Use: Never used  Substance Use Topics   Alcohol use: No   Drug use: No     Allergies   Patient has no known allergies.   Review of Systems Review of Systems Per HPI  Physical Exam Triage Vital Signs ED Triage Vitals  Enc Vitals Group     BP 06/20/22 1402 125/77     Pulse Rate 06/20/22 1402 70     Resp 06/20/22 1402 18     Temp 06/20/22 1402 98 F (36.7 C)     Temp Source 06/20/22 1402 Oral     SpO2 06/20/22 1402 96 %     Weight --      Height --      Head Circumference --      Peak Flow --      Pain Score 06/20/22 1404 0     Pain Loc --      Pain Edu? --      Excl. in [place]? --    No data found.  Updated Vital Signs BP 125/77 (BP Location: Right Arm)   Pulse 70   Temp 98 F (36.7 C) (Oral)   Resp 18   SpO2 96%   Visual Acuity Right Eye Distance:   Left Eye Distance:   Bilateral Distance:    Right Eye Near:   Left Eye Near:    Bilateral Near:     Physical Exam Vitals and nursing note reviewed.  Constitutional:      General: She is not in acute distress.    Appearance: Normal appearance.  HENT:     Head: Normocephalic.     Right Ear: Tympanic membrane, ear canal and external ear normal.     Left Ear: Tympanic membrane, ear canal and external ear normal.     Nose: Congestion present.     Mouth/Throat:     Mouth: Mucous membranes are moist.     Pharynx: Posterior oropharyngeal erythema present.  Eyes:     General: Lids are normal. Vision grossly intact.        Right eye: Discharge present. No foreign body or hordeolum.        Left eye: Discharge present.No foreign body or hordeolum.     Extraocular Movements: Extraocular movements intact.  Right eye: Normal extraocular motion and no nystagmus.     Left eye: Normal extraocular motion and no nystagmus.     Conjunctiva/sclera:     Right eye: Right conjunctiva is injected.     Left eye: Left conjunctiva is injected.     Pupils: Pupils are equal, round, and reactive to light.  Cardiovascular:     Rate and Rhythm: Normal rate and regular rhythm.     Pulses: Normal pulses.     Heart sounds: Normal heart sounds.  Pulmonary:     Effort: Pulmonary effort is normal. No respiratory distress.     Breath sounds: Normal breath sounds. No stridor. No wheezing, rhonchi or rales.  Abdominal:     General: Bowel sounds are normal.     Palpations: Abdomen is soft.     Tenderness: There is no abdominal tenderness.  Musculoskeletal:     Cervical back: Normal range of motion.  Skin:    General: Skin is warm and dry.  Neurological:     General: No focal deficit present.     Mental Status: She is alert and oriented to person, place, and time.  Psychiatric:        Mood and Affect: Mood normal.        Behavior: Behavior normal.      UC Treatments / Results  Labs (all labs ordered are listed, but only abnormal results are displayed) Labs Reviewed - No data to display  EKG   Radiology DG Chest 2 View  Result Date: 06/20/2022 CLINICAL DATA:  Fever with cough, congestion, shortness of breath, and confusion. EXAM: CHEST - 2 VIEW COMPARISON:  Chest radiograph 05/29/2020 FINDINGS: There is artifact from the patient's clothing. The cardiomediastinal silhouette is within normal limits. Aortic atherosclerosis is noted. The lungs are hyperinflated with chronic bronchitic changes. No acute airspace consolidation, pulmonary edema, pleural effusion, or pneumothorax is identified. Right upper quadrant abdominal surgical clips are noted. No acute osseous abnormality is seen. IMPRESSION: Chronic bronchitic changes. No evidence of active cardiopulmonary disease. Electronically Signed   By: Logan Bores M.D.   On: 06/20/2022 14:31    Procedures Procedures (including critical care time)  Medications Ordered in UC Medications - No data to display  Initial Impression / Assessment and Plan / UC Course  I have reviewed the triage vital signs and the nursing notes.  Pertinent labs & imaging results that were available during my care of the patient were reviewed by me and considered in my medical decision making (see chart for details).  Patient brought in by her husband for upper respiratory symptoms that started 4 days ago.  Patient also presents with bilateral eye drainage and redness that started this morning.  Patient's vital signs are stable, she is in no acute distress.  Chest x-ray was performed to confirm no presence of pneumonia.  X-ray does show chronic bronchitic changes.  Diagnoses include viral upper respiratory tract infection with cough and acute conjunctivitis of both eyes.  We will treat patient symptomatically for her cough with Tessalon and fluticasone.  For her eyes, Polytrim eyedrops were prescribed.  Supportive care recommendations were provided to the patient and her spouse.  Discussed with the patient's husband strict return precautions and indications of when to go to the emergency department.  Patient advised to follow-up with her primary care physician if symptoms fail to improve.  Patient and spouse verbalized understanding.  All questions were answered. Final Clinical Impressions(s) / UC Diagnoses   Final diagnoses:  Viral upper respiratory tract infection with cough  Acute conjunctivitis of both eyes, unspecified acute conjunctivitis type     Discharge Instructions      The chest x-ray is negative for pneumonia or other infectious process. Take medication as prescribed. Increase fluids and allow for plenty of rest. Recommend Tylenol or ibuprofen as needed for pain, fever, or Recommend using a humidifier at bedtime during sleep to help with cough and nasal  congestion. Sleep elevated on 2 pillows while cough symptoms persist.  Use eyedrops as prescribed.   Cool compresses to the eyes to help with pain or swelling. May continue the over-the-counter eyedrops you are using to help keep the eyes moist and decreased redness. Strict handwashing when applying medication.  Avoid rubbing or manipulating the eyes while symptoms persist. Discard any eye make-up that were using prior to your symptoms starting.  Follow-up in this clinic or with your primary care physician if your symptoms do not improve.      ED Prescriptions     Medication Sig Dispense Auth. Provider   benzonatate (TESSALON PERLES) 100 MG capsule Take 1 capsule (100 mg total) by mouth 3 (three) times daily as needed for up to 10 days for cough. 30 capsule Rilen Shukla-Warren, Alda Lea, NP   fluticasone (FLONASE) 50 MCG/ACT nasal spray Place 2 sprays into both nostrils daily. 16 g Fardowsa Authier-Warren, Alda Lea, NP   trimethoprim-polymyxin b (POLYTRIM) ophthalmic solution Place 1 drop into both eyes every 6 (six) hours for 7 days. 10 mL Evellyn Tuff-Warren, Alda Lea, NP      PDMP not reviewed this encounter.   Tish Men, NP 06/20/22 [phone]

## 2022-06-20 NOTE — Discharge Instructions (Addendum)
The chest x-ray is negative for pneumonia or other infectious process. Take medication as prescribed. Increase fluids and allow for plenty of rest. Recommend Tylenol or ibuprofen as needed for pain, fever, or Recommend using a humidifier at bedtime during sleep to help with cough and nasal congestion. Sleep elevated on 2 pillows while cough symptoms persist.  Use eyedrops as prescribed.   Cool compresses to the eyes to help with pain or swelling. May continue the over-the-counter eyedrops you are using to help keep the eyes moist and decreased redness. Strict handwashing when applying medication.  Avoid rubbing or manipulating the eyes while symptoms persist. Discard any eye make-up that were using prior to your symptoms starting.  Follow-up in this clinic or with your primary care physician if your symptoms do not improve.

## 2022-06-20 NOTE — ED Triage Notes (Signed)
Fever this morning.  Nasal congestion and cough since Friday.

## 2022-06-23 DIAGNOSIS — E785 Hyperlipidemia, unspecified: Secondary | ICD-10-CM | POA: Diagnosis not present

## 2022-06-23 DIAGNOSIS — I10 Essential (primary) hypertension: Secondary | ICD-10-CM | POA: Diagnosis not present

## 2022-07-11 ENCOUNTER — Encounter: Payer: Self-pay | Admitting: Adult Health

## 2022-07-11 ENCOUNTER — Ambulatory Visit (INDEPENDENT_AMBULATORY_CARE_PROVIDER_SITE_OTHER): Payer: PPO | Admitting: Adult Health

## 2022-07-11 VITALS — BP 152/82 | HR 60 | Ht 61.0 in | Wt 106.0 lb

## 2022-07-11 DIAGNOSIS — N952 Postmenopausal atrophic vaginitis: Secondary | ICD-10-CM | POA: Diagnosis not present

## 2022-07-11 DIAGNOSIS — Z01419 Encounter for gynecological examination (general) (routine) without abnormal findings: Secondary | ICD-10-CM | POA: Diagnosis not present

## 2022-07-11 DIAGNOSIS — Z1211 Encounter for screening for malignant neoplasm of colon: Secondary | ICD-10-CM | POA: Insufficient documentation

## 2022-07-11 LAB — HEMOCCULT GUIAC POC 1CARD (OFFICE): Fecal Occult Blood, POC: NEGATIVE

## 2022-07-11 NOTE — Progress Notes (Signed)
Patient ID: [name], female   DOB: September 15, 1941, 81 y.o.   MRN: [id number] History of Present Illness: Kristine [name] is a 81 year old white female,married,PM in for well woman gyn exam. She says she has some memory issues and is on meds, forgot to bring today. And she does not eat as much, not much of appetite. She did go to [employer] last weekend.  PCP is Dr Kristine [name].  Current Medications, Allergies, Past Medical History, Past Surgical History, Family History and Social History were reviewed in [employer] record.     Review of Systems: Patient denies any headaches, hearing loss, fatigue, blurred vision, shortness of breath, chest pain, abdominal pain, problems with bowel movements, urination, or intercourse(does not have often).  No joint pain or mood swings.  See HPI for positives.   Physical Exam:BP (!) 152/82 (BP Location: Left Arm, Patient Position: Sitting, Cuff Size: Normal)   Pulse 60   Ht '5\' 1"'$  (1.549 m)   Wt 106 lb (48.1 kg)   BMI 20.03 kg/m   General:  Well developed, well nourished, no acute distress Skin:  Warm and dry Neck:  Midline trachea, normal thyroid, good ROM, no lymphadenopathy, no carotid bruits heard Lungs; Clear to auscultation bilaterally Breast:  No dominant palpable mass, retraction, or nipple discharge Cardiovascular: Regular rate and rhythm Abdomen:  Soft, non tender, no hepatosplenomegaly Pelvic:  External genitalia is normal in appearance, no lesions.  The vagina is pale and atrophic. Urethra has no lesions or masses. The cervix is smooth.  Uterus is felt to be normal size, shape, and contour.  No adnexal masses or tenderness noted.Bladder is non tender, no masses felt. Rectal: Good sphincter tone, no polyps, or hemorrhoids felt.  Hemoccult negative. Extremities/musculoskeletal:  No swelling or varicosities noted, no clubbing or cyanosis Psych:  No mood changes, alert and cooperative,seems happy AA is 0 Fall risk is low     07/11/2022    1:34 PM 03/22/2021    9:47 AM  Depression screen PHQ 2/9  Decreased Interest 0 0  Down, Depressed, Hopeless 0 0  PHQ - 2 Score 0 0  Altered sleeping 0 0  Tired, decreased energy 0 0  Change in appetite 0 0  Feeling bad or failure about yourself  0 0  Trouble concentrating 0 0  Moving slowly or fidgety/restless 0 0  Suicidal thoughts 0 0  PHQ-9 Score 0 0       07/11/2022    1:34 PM 03/22/2021    9:47 AM  GAD 7 : Generalized Anxiety Score  Nervous, Anxious, on Edge 0 0  Control/stop worrying 0 0  Worry too much - different things 0 0  Trouble relaxing 0 0  Restless 0 0  Easily annoyed or irritable 0 0  Afraid - awful might happen 0 0  Total GAD 7 Score 0 0      Upstream - 07/11/22 1342       Pregnancy Intention Screening   Does the patient want to become pregnant in the next year? N/A    Does the patient's partner want to become pregnant in the next year? N/A    Would the patient like to discuss contraceptive options today? N/A      Contraception Wrap Up   Current Method No Method - Other Reason   postmenopausal   End Method No Method - Other Reason   postmenopausal   Contraception Counseling Provided No  Examination chaperoned by Levy Pupa LPN   Impression and Plan: 1. Encounter for well woman exam with routine gynecological exam Physical with PCP Follow up prn Labs with PCP Try to eat small frequent snacks, Ensure or Boost, put chart on refrigerator to remind  2. Encounter for screening fecal occult blood testing Hemoccult was negative   3. Vaginal atrophy Has estrace cream at home

## 2022-07-27 ENCOUNTER — Encounter (HOSPITAL_COMMUNITY): Payer: Self-pay

## 2022-07-27 ENCOUNTER — Emergency Department (HOSPITAL_COMMUNITY): Payer: PPO

## 2022-07-27 ENCOUNTER — Other Ambulatory Visit: Payer: Self-pay

## 2022-07-27 ENCOUNTER — Inpatient Hospital Stay (HOSPITAL_COMMUNITY)
Admission: EM | Admit: 2022-07-27 | Discharge: 2022-07-29 | DRG: 871 | Disposition: A | Discharge status: Home / Self Care | Payer: PPO | Attending: Internal Medicine | Admitting: Internal Medicine

## 2022-07-27 DIAGNOSIS — Z87891 Personal history of nicotine dependence: Secondary | ICD-10-CM | POA: Diagnosis not present

## 2022-07-27 DIAGNOSIS — D84821 Immunodeficiency due to drugs: Secondary | ICD-10-CM | POA: Diagnosis not present

## 2022-07-27 DIAGNOSIS — Z79624 Long term (current) use of inhibitors of nucleotide synthesis: Secondary | ICD-10-CM | POA: Diagnosis not present

## 2022-07-27 DIAGNOSIS — Z743 Need for continuous supervision: Secondary | ICD-10-CM | POA: Diagnosis not present

## 2022-07-27 DIAGNOSIS — F05 Delirium due to known physiological condition: Secondary | ICD-10-CM | POA: Diagnosis not present

## 2022-07-27 DIAGNOSIS — R6520 Severe sepsis without septic shock: Secondary | ICD-10-CM | POA: Diagnosis not present

## 2022-07-27 DIAGNOSIS — I10 Essential (primary) hypertension: Secondary | ICD-10-CM | POA: Diagnosis not present

## 2022-07-27 DIAGNOSIS — R410 Disorientation, unspecified: Secondary | ICD-10-CM

## 2022-07-27 DIAGNOSIS — Z940 Kidney transplant status: Secondary | ICD-10-CM

## 2022-07-27 DIAGNOSIS — E785 Hyperlipidemia, unspecified: Secondary | ICD-10-CM | POA: Diagnosis present

## 2022-07-27 DIAGNOSIS — Z79818 Long term (current) use of other agents affecting estrogen receptors and estrogen levels: Secondary | ICD-10-CM

## 2022-07-27 DIAGNOSIS — Z7982 Long term (current) use of aspirin: Secondary | ICD-10-CM

## 2022-07-27 DIAGNOSIS — G9341 Metabolic encephalopathy: Secondary | ICD-10-CM | POA: Diagnosis present

## 2022-07-27 DIAGNOSIS — N3000 Acute cystitis without hematuria: Secondary | ICD-10-CM | POA: Diagnosis not present

## 2022-07-27 DIAGNOSIS — D696 Thrombocytopenia, unspecified: Secondary | ICD-10-CM | POA: Diagnosis not present

## 2022-07-27 DIAGNOSIS — F01A0 Vascular dementia, mild, without behavioral disturbance, psychotic disturbance, mood disturbance, and anxiety: Secondary | ICD-10-CM | POA: Diagnosis present

## 2022-07-27 DIAGNOSIS — A419 Sepsis, unspecified organism: Secondary | ICD-10-CM | POA: Diagnosis not present

## 2022-07-27 DIAGNOSIS — Z79899 Other long term (current) drug therapy: Secondary | ICD-10-CM | POA: Diagnosis not present

## 2022-07-27 DIAGNOSIS — R4182 Altered mental status, unspecified: Secondary | ICD-10-CM | POA: Diagnosis not present

## 2022-07-27 DIAGNOSIS — R4701 Aphasia: Secondary | ICD-10-CM | POA: Diagnosis not present

## 2022-07-27 DIAGNOSIS — L8990 Pressure ulcer of unspecified site, unspecified stage: Secondary | ICD-10-CM | POA: Insufficient documentation

## 2022-07-27 DIAGNOSIS — Z8249 Family history of ischemic heart disease and other diseases of the circulatory system: Secondary | ICD-10-CM

## 2022-07-27 DIAGNOSIS — Z1152 Encounter for screening for COVID-19: Secondary | ICD-10-CM | POA: Diagnosis not present

## 2022-07-27 DIAGNOSIS — E871 Hypo-osmolality and hyponatremia: Secondary | ICD-10-CM | POA: Diagnosis not present

## 2022-07-27 DIAGNOSIS — G459 Transient cerebral ischemic attack, unspecified: Secondary | ICD-10-CM | POA: Diagnosis not present

## 2022-07-27 DIAGNOSIS — R29818 Other symptoms and signs involving the nervous system: Secondary | ICD-10-CM | POA: Diagnosis not present

## 2022-07-27 HISTORY — DX: Vascular dementia, unspecified severity, without behavioral disturbance, psychotic disturbance, mood disturbance, and anxiety: F01.50

## 2022-07-27 LAB — PROTIME-INR
INR: 1.1 (ref 0.8–1.2)
Prothrombin Time: 13.8 seconds (ref 11.4–15.2)

## 2022-07-27 LAB — CBC
HCT: 41.6 % (ref 36.0–46.0)
Hemoglobin: 13.4 g/dL (ref 12.0–15.0)
MCH: 28.6 pg (ref 26.0–34.0)
MCHC: 32.2 g/dL (ref 30.0–36.0)
MCV: 88.9 fL (ref 80.0–100.0)
Platelets: 131 10*3/uL — ABNORMAL LOW (ref 150–400)
RBC: 4.68 MIL/uL (ref 3.87–5.11)
RDW: 13.9 % (ref 11.5–15.5)
WBC: 15.3 10*3/uL — ABNORMAL HIGH (ref 4.0–10.5)
nRBC: 0 % (ref 0.0–0.2)

## 2022-07-27 LAB — RESP PANEL BY RT-PCR (FLU A&B, COVID) ARPGX2
Influenza A by PCR: NEGATIVE
Influenza B by PCR: NEGATIVE
SARS Coronavirus 2 by RT PCR: NEGATIVE

## 2022-07-27 LAB — I-STAT CHEM 8, ED
BUN: 16 mg/dL (ref 8–23)
Calcium, Ion: 1.1 mmol/L — ABNORMAL LOW (ref 1.15–1.40)
Chloride: 98 mmol/L (ref 98–111)
Creatinine, Ser: 0.6 mg/dL (ref 0.44–1.00)
Glucose, Bld: 144 mg/dL — ABNORMAL HIGH (ref 70–99)
HCT: 44 % (ref 36.0–46.0)
Hemoglobin: 15 g/dL (ref 12.0–15.0)
Potassium: 4.8 mmol/L (ref 3.5–5.1)
Sodium: 132 mmol/L — ABNORMAL LOW (ref 135–145)
TCO2: 26 mmol/L (ref 22–32)

## 2022-07-27 LAB — COMPREHENSIVE METABOLIC PANEL
ALT: 22 U/L (ref 0–44)
AST: 26 U/L (ref 15–41)
Albumin: 3.6 g/dL (ref 3.5–5.0)
Alkaline Phosphatase: 65 U/L (ref 38–126)
Anion gap: 8 (ref 5–15)
BUN: 13 mg/dL (ref 8–23)
CO2: 24 mmol/L (ref 22–32)
Calcium: 8.8 mg/dL — ABNORMAL LOW (ref 8.9–10.3)
Chloride: 99 mmol/L (ref 98–111)
Creatinine, Ser: 0.73 mg/dL (ref 0.44–1.00)
GFR, Estimated: >60 mL/min (ref >60)
Glucose, Bld: 145 mg/dL — ABNORMAL HIGH (ref 70–99)
Potassium: 4.1 mmol/L (ref 3.5–5.1)
Sodium: 131 mmol/L — ABNORMAL LOW (ref 135–145)
Total Bilirubin: 1.2 mg/dL (ref 0.3–1.2)
Total Protein: 7.3 g/dL (ref 6.5–8.1)

## 2022-07-27 LAB — URINALYSIS, ROUTINE W REFLEX MICROSCOPIC
Bilirubin Urine: NEGATIVE
Glucose, UA: NEGATIVE mg/dL
Ketones, ur: NEGATIVE mg/dL
Nitrite: POSITIVE — AB
Protein, ur: 30 mg/dL — AB
Specific Gravity, Urine: 1.012 (ref 1.005–1.030)
pH: 5 (ref 5.0–8.0)

## 2022-07-27 LAB — ETHANOL: Alcohol, Ethyl (B): <10 mg/dL (ref <10)

## 2022-07-27 LAB — LACTIC ACID, PLASMA
Lactic Acid, Venous: 1.2 mmol/L (ref 0.5–1.9)
Lactic Acid, Venous: 0.9 mmol/L (ref 0.5–1.9)

## 2022-07-27 LAB — RAPID URINE DRUG SCREEN, HOSP PERFORMED
Amphetamines: NOT DETECTED
Barbiturates: NOT DETECTED
Benzodiazepines: NOT DETECTED
Cocaine: NOT DETECTED
Opiates: NOT DETECTED
Tetrahydrocannabinol: NOT DETECTED

## 2022-07-27 LAB — DIFFERENTIAL
Abs Immature Granulocytes: 0.08 10*3/uL — ABNORMAL HIGH (ref 0.00–0.07)
Basophils Absolute: 0 10*3/uL (ref 0.0–0.1)
Basophils Relative: 0 %
Eosinophils Absolute: 0 10*3/uL (ref 0.0–0.5)
Eosinophils Relative: 0 %
Immature Granulocytes: 1 %
Lymphocytes Relative: 3 %
Lymphs Abs: 0.4 10*3/uL — ABNORMAL LOW (ref 0.7–4.0)
Monocytes Absolute: 0.9 10*3/uL (ref 0.1–1.0)
Monocytes Relative: 6 %
Neutro Abs: 14 10*3/uL — ABNORMAL HIGH (ref 1.7–7.7)
Neutrophils Relative %: 90 %

## 2022-07-27 LAB — AMMONIA: Ammonia: 24 umol/L (ref 9–35)

## 2022-07-27 LAB — APTT: aPTT: 32 seconds (ref 24–36)

## 2022-07-27 MED ORDER — ACETAMINOPHEN 325 MG PO TABS: 650.0000 mg | ORAL_TABLET | Freq: Four times a day (QID) | ORAL | Status: DC | PRN

## 2022-07-27 MED ORDER — SODIUM CHLORIDE 0.9 % IV SOLN
1.0000 g | Freq: Once | INTRAVENOUS | Status: AC
  Administered 2022-07-27: 1 g via INTRAVENOUS
  Filled 2022-07-27: qty 10

## 2022-07-27 MED ORDER — ENOXAPARIN SODIUM 40 MG/0.4ML IJ SOSY
40.0000 mg | PREFILLED_SYRINGE | INTRAMUSCULAR | Status: DC
  Administered 2022-07-27 – 2022-07-28 (×2): 40 mg via SUBCUTANEOUS
  Filled 2022-07-27 (×2): qty 0.4

## 2022-07-27 MED ORDER — POLYETHYLENE GLYCOL 3350 17 G PO PACK: 17.0000 g | PACK | Freq: Every day | ORAL | Status: DC | PRN

## 2022-07-27 MED ORDER — ACETAMINOPHEN 650 MG RE SUPP
650.0000 mg | Freq: Once | RECTAL | Status: AC
  Administered 2022-07-27: 650 mg via RECTAL
  Filled 2022-07-27: qty 1

## 2022-07-27 MED ORDER — ONDANSETRON HCL 4 MG PO TABS: 4.0000 mg | ORAL_TABLET | Freq: Four times a day (QID) | ORAL | Status: DC | PRN

## 2022-07-27 MED ORDER — LACTATED RINGERS IV BOLUS: 500.0000 mL | Freq: Once | INTRAVENOUS | Status: DC

## 2022-07-27 MED ORDER — ONDANSETRON HCL 4 MG/2ML IJ SOLN: 4.0000 mg | Freq: Four times a day (QID) | INTRAMUSCULAR | Status: DC | PRN

## 2022-07-27 MED ORDER — SODIUM CHLORIDE 0.9 % IV SOLN
1.0000 g | INTRAVENOUS | Status: DC
  Administered 2022-07-28 – 2022-07-29 (×2): 1 g via INTRAVENOUS
  Filled 2022-07-27 (×2): qty 10

## 2022-07-27 MED ORDER — SODIUM CHLORIDE 0.9 % IV SOLN: INTRAVENOUS | Status: AC

## 2022-07-27 MED ORDER — TACROLIMUS 0.5 MG PO CAPS: 0.5000 mg | ORAL_CAPSULE | Freq: Two times a day (BID) | ORAL | Status: DC

## 2022-07-27 MED ORDER — ACETAMINOPHEN 650 MG RE SUPP: 650.0000 mg | Freq: Four times a day (QID) | RECTAL | Status: DC | PRN

## 2022-07-27 MED ORDER — ACETAMINOPHEN 500 MG PO TABS: 1000.0000 mg | ORAL_TABLET | Freq: Once | ORAL | Status: DC

## 2022-07-27 NOTE — Assessment & Plan Note (Signed)
Blood pressure stable. -Hold Norvasc while n.p.o.

## 2022-07-27 NOTE — ED Triage Notes (Signed)
Pt brought to ED via [facility] for Code Stroke. Pt found at 1230 with difficulty following commands, favoring right side. LKW [id number]

## 2022-07-27 NOTE — ED Notes (Signed)
68 Confirmed with Dr Earnestine Leys pt not eligible for TNK and he had no BAO concern and disconnected from call.

## 2022-07-27 NOTE — ED Notes (Signed)
1245 Pre alert of stroke cart 1246 Message to [facility] reporting prearrival 1253 Pt arrives via EMS 1252 Message to [facility] reporting arrival 1256 Pt to [address] Earnestine Leys joins cart 1300 Pt back from [place]

## 2022-07-27 NOTE — H&P (Addendum)
History and Physical    Kristine [name] OEV:[id number] DOB: Nov 23, 1940 DOA: 07/27/2022  PCP: Asencion Noble, MD   Patient coming from: Home  I have personally briefly reviewed patient's old medical records in [place]  Chief Complaint: AMS  HPI: Kristine [name] is a 81 y.o. female with medical history significant for kidney transplant, dementia. Patient was brought to the ED via EMS reports of altered mental status. At the time of my evaluation, patient is awake alert oriented to person only, she responds that she remembers the events of the day, but she is unable to answer just simple questions, and is unable to give me details.  Tells me she remembers the she remembers the events.  Son Lesly Rubenstein, is at bedside.   Patient spouse was going golfing at about 9:05, and patient was her normal self, when he came back at about 1155, patient was slumped over on her right, had been incontinent of feces, was not responding to questions, and could not answer questions.  Son reports she might have had slurred speech- Son is not aware of this, her speech has not been slurred.  Son reports she has had a mild cough over the past 2 weeks.  Difficulty breathing.  She received her COVID and flu vaccines about a week or 2 ago.   Son, at baseline, patient has some short-term memory deficits, but she ambulates without assistance or assistive device, she is able to answer questions and hold a conversation to a good extent.  ED Course: Febrile to 102.6.  Heart rate 70-79.  Respirate rate 13-29.  Blood pressure systolic 829-937.  Leukocytosis of 15.3.  Lactic acid 1.2 > 0.9.  UA with positive nitrite small leukocytes.  UDS and blood alcohol level unremarkable.  Chest x-ray clear. Head CT negative for acute abnormality.   MRI brain was without acute abnormality, showed small chronic lacunar infarcts and small vessel ischemic changes. Code stroke called, evaluated by telemetry neurologist, feels this is more delirium versus  seizure versus infection versus order but not stroke related. IV Rocephin started. Hospitalist to admit.  Review of Systems: As per HPI all other systems reviewed and negative.  Past Medical History:  Diagnosis Date   Chronic kidney disease    had kidney transplant   Vascular dementia without behavioral disturbance [facility])     Past Surgical History:  Procedure Laterality Date   CHOLECYSTECTOMY     COLONOSCOPY  08/28/2012   Procedure: COLONOSCOPY;  Surgeon: Rogene Houston, MD;  Location: [facility];  Service: Endoscopy;  Laterality: N/A;  930   COLONOSCOPY WITH PROPOFOL N/A 07/14/2020   Procedure: COLONOSCOPY WITH PROPOFOL;  Surgeon: Rogene Houston, MD;  Location: [facility];  Service: Endoscopy;  Laterality: N/A;  930   HYSTEROSCOPY WITH D & C N/A 01/11/2021   Procedure: DILATATION AND CURETTAGE /HYSTEROSCOPY;  Surgeon: [name], MD;  Location: [facility] ORS;  Service: Gynecology;  Laterality: N/A;   KIDNEY TRANSPLANT     orif left wrist     TONSILLECTOMY       reports that she has quit smoking. She has never used smokeless tobacco. She reports that she does not drink alcohol and does not use drugs.  No Known Allergies  Family history of hypertension.  Prior to Admission medications   Medication Sig Start Date End Date Taking? Authorizing Provider  amLODipine (NORVASC) 2.5 MG tablet Take 2.5 mg by mouth at bedtime.  03/02/20  Yes [provider]  aspirin  EC 81 MG tablet Take 81 mg by mouth daily.   Yes [provider]  Cholecalciferol (VITAMIN D) 2000 UNITS CAPS Take 2,000 Units by mouth daily.    Yes [provider]  donepezil (ARICEPT) 10 MG tablet Take 10 mg by mouth daily. 07/17/22  Yes [provider]  estradiol (ESTRACE VAGINAL) 0.1 MG/GM vaginal cream Use 0.5 gm in vagina 2-3 x weekly 10/20/21  Yes Derrek Monaco A, NP  mycophenolate (MYFORTIC) 180 MG EC tablet Take 540 mg by mouth 2 (two) times daily.   Yes [provider]  simvastatin (ZOCOR) 20 MG tablet Take 20 mg by mouth at bedtime.    Yes [provider]  solifenacin (VESICARE) 10 MG tablet Take 10 mg by mouth daily. 07/10/22  Yes [provider]  tacrolimus (PROGRAF) 0.5 MG capsule Take 0.5 mg by mouth 2 (two) times daily.    Yes [provider]    Physical Exam: Exam limited by patient's altered mental status. Vitals:   07/27/22 1530 07/27/22 1600 07/27/22 1630 07/27/22 1700  BP: 122/66 128/73 118/84 (!) 133/50  Pulse: 70 74 74 77  Resp: 19 15 (!) 22 (!) 22  Temp:  (!) 101.3 F (38.5 C)    TempSrc:      SpO2: 95% 96% 98% 100%  Weight:      Height:        Constitutional: NAD, calm, comfortable Vitals:   07/27/22 1530 07/27/22 1600 07/27/22 1630 07/27/22 1700  BP: 122/66 128/73 118/84 (!) 133/50  Pulse: 70 74 74 77  Resp: 19 15 (!) 22 (!) 22  Temp:  (!) 101.3 F (38.5 C)    TempSrc:      SpO2: 95% 96% 98% 100%  Weight:      Height:       Eyes: PERRL, lids and conjunctivae normal ENMT: Mucous membranes are moist.   Neck: normal, supple, no masses, no thyromegaly Respiratory: clear to auscultation bilaterally, no wheezing, no crackles. Normal respiratory effort. No accessory muscle use.  Cardiovascular: Regular rate and rhythm, 3/6 systolic murmur-son not aware, no rubs / gallops. No extremity edema.  Extremities warm. Abdomen: no tenderness, no masses palpated. No hepatosplenomegaly. Bowel sounds positive.  Musculoskeletal: no clubbing / cyanosis. No joint deformity upper and lower extremities.  Skin: no rashes, lesions, ulcers. No induration Neurologic: Limited exam due to altered mental status.  Able to answer simple questions with short answers, speech is not slurred, there is hesitancy in answering questions.  Moving all extremities- 4/5 bilateral upper extremity, poor effort in moving bilateral lower extremities. Psychiatric: Able to tell me her first name only, otherwise unable to not  oriented to place time or situation.  Unable to tell me her son's name.  Labs on Admission: I have personally reviewed following labs and imaging studies  CBC: Recent Labs  Lab 07/27/22 1300 07/27/22 1320  WBC  --  15.3*  NEUTROABS  --  14.0*  HGB 15.0 13.4  HCT 44.0 41.6  MCV  --  88.9  PLT  --  270*   Basic Metabolic Panel: Recent Labs  Lab 07/27/22 1300 07/27/22 1320  NA 132* 131*  K 4.8 4.1  CL 98 99  CO2  --  24  GLUCOSE 144* 145*  BUN 16 13  CREATININE 0.60 0.73  CALCIUM  --  8.8*   Liver Function Tests: Recent Labs  Lab 07/27/22 1320  AST 26  ALT 22  ALKPHOS 65  BILITOT 1.2  PROT 7.3  ALBUMIN 3.6   Coagulation Profile: Recent Labs  Lab 07/27/22 1320  INR 1.1   Urine analysis:    Component Value Date/Time   COLORURINE YELLOW 07/27/2022 [place] 07/27/2022 1416   LABSPEC 1.012 07/27/2022 1416   PHURINE 5.0 07/27/2022 1416   GLUCOSEU NEGATIVE 07/27/2022 1416   HGBUR MODERATE (A) 07/27/2022 1416   BILIRUBINUR NEGATIVE 07/27/2022 1416   BILIRUBINUR negative 02/28/2021 0954   KETONESUR NEGATIVE 07/27/2022 1416   PROTEINUR 30 (A) 07/27/2022 1416   UROBILINOGEN 1.0 02/28/2021 0954   NITRITE POSITIVE (A) 07/27/2022 1416   LEUKOCYTESUR SMALL (A) 07/27/2022 1416    Radiological Exams on Admission: MR BRAIN WO CONTRAST  Result Date: 07/27/2022 CLINICAL DATA:  Provided history: Neuro deficit, acute, stroke suspected. EXAM: MRI HEAD WITHOUT CONTRAST MRA HEAD WITHOUT CONTRAST TECHNIQUE: Multiplanar, multi-echo pulse sequences of the brain and surrounding structures were acquired without intravenous contrast. Angiographic images of the Circle of Willis were acquired using MRA technique without intravenous contrast. COMPARISON:  Head CT 07/27/2022. FINDINGS: MRI HEAD FINDINGS Brain: Moderate cerebral atrophy. Advanced multifocal T2 FLAIR hyperintense signal abnormality within the cerebral white matter, nonspecific but compatible chronic small  vessel image disease. Minimal chronic small-vessel ischemic changes also present within the pons. 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 There is no acute infarct. No evidence of an intracranial mass. No extra-axial fluid collection. No midline shift. Vascular: Maintained flow voids within the proximal large arterial vessels. Skull and upper cervical spine: No focal suspicious marrow lesion. Incompletely assessed cervical spondylosis. Sinuses/Orbits: No mass or acute finding within the imaged orbits. No significant paranasal sinus disease. Other: Left mastoid effusion. MRA HEAD FINDING: Anterior circulation: The intracranial internal carotid arteries are patent. The M1 middle cerebral arteries are patent. No M2 proximal branch occlusion or high-grade proximal stenosis. The anterior cerebral arteries are patent. 2 mm broad-based medially projecting vascular protrusion arising from the cavernous left ICA, which may reflect atherosclerotic lobulation or an aneurysm (for instance as seen on series 1035, image 19). Posterior circulation: The intracranial vertebral arteries are patent. Apparent moderate to severe stenosis within the left PCA at the P3/P4 junction (series 1047, image 15). The right PCA is fetal in origin. A left posterior communicating artery is present. Anatomic variants: As described. IMPRESSION: MRI brain: 1. No evidence of acute intracranial abnormality. 2. Chronic small-vessel ischemic changes which are advanced in the cerebral white matter, and minimal in the pons. 3. 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 4. Moderate cerebral atrophy. 5. Left mastoid effusion. MRA head: 1. No intracranial large vessel occlusion is identified. 2. Apparent moderate/severe stenosis within the left PCA at the P3/P4 junction. 3. 2 mm broad-based medially projecting vascular protrusion arising from the cavernous left ICA, which may reflect an aneurysm or  atherosclerotic lobulation. Electronically Signed   By: Kellie Simmering D.O.   On: 07/27/2022 15:05   MR ANGIO HEAD WO CONTRAST  Result Date: 07/27/2022 CLINICAL DATA:  Provided history: Neuro deficit, acute, stroke suspected. EXAM: MRI HEAD WITHOUT CONTRAST MRA HEAD WITHOUT CONTRAST TECHNIQUE: Multiplanar, multi-echo pulse sequences of the brain and surrounding structures were acquired without intravenous contrast. Angiographic images of the Circle of Willis were acquired using MRA technique without intravenous contrast. COMPARISON:  Head CT 07/27/2022. FINDINGS: MRI HEAD FINDINGS Brain: Moderate cerebral atrophy. Advanced multifocal T2 FLAIR hyperintense signal abnormality within the cerebral white matter, nonspecific but compatible chronic small vessel image disease. Minimal chronic small-vessel ischemic changes  also present within the pons. 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 There is no acute infarct. No evidence of an intracranial mass. No extra-axial fluid collection. No midline shift. Vascular: Maintained flow voids within the proximal large arterial vessels. Skull and upper cervical spine: No focal suspicious marrow lesion. Incompletely assessed cervical spondylosis. Sinuses/Orbits: No mass or acute finding within the imaged orbits. No significant paranasal sinus disease. Other: Left mastoid effusion. MRA HEAD FINDING: Anterior circulation: The intracranial internal carotid arteries are patent. The M1 middle cerebral arteries are patent. No M2 proximal branch occlusion or high-grade proximal stenosis. The anterior cerebral arteries are patent. 2 mm broad-based medially projecting vascular protrusion arising from the cavernous left ICA, which may reflect atherosclerotic lobulation or an aneurysm (for instance as seen on series 1035, image 19). Posterior circulation: The intracranial vertebral arteries are patent. Apparent moderate to severe stenosis within the left PCA at the  P3/P4 junction (series 1047, image 15). The right PCA is fetal in origin. A left posterior communicating artery is present. Anatomic variants: As described. IMPRESSION: MRI brain: 1. No evidence of acute intracranial abnormality. 2. Chronic small-vessel ischemic changes which are advanced in the cerebral white matter, and minimal in the pons. 3. 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 4. Moderate cerebral atrophy. 5. Left mastoid effusion. MRA head: 1. No intracranial large vessel occlusion is identified. 2. Apparent moderate/severe stenosis within the left PCA at the P3/P4 junction. 3. 2 mm broad-based medially projecting vascular protrusion arising from the cavernous left ICA, which may reflect an aneurysm or atherosclerotic lobulation. Electronically Signed   By: Kellie Simmering D.O.   On: 07/27/2022 15:05   DG Chest Port 1 View  Result Date: 07/27/2022 CLINICAL DATA:  Code stroke, evaluate for pneumonia EXAM: PORTABLE CHEST 1 VIEW COMPARISON:  06/20/2022 FINDINGS: The heart size and mediastinal contours are within normal limits. Both lungs are clear. The visualized skeletal structures are unremarkable. IMPRESSION: No active disease. Electronically Signed   By: Elmer Picker M.D.   On: 07/27/2022 14:24   CT HEAD CODE STROKE WO CONTRAST  Result Date: 07/27/2022 CLINICAL DATA:  Code stroke. Neuro deficit, acute, stroke suspected. Altered mental status, last known well 9:30 a.m., abnormal gait. EXAM: CT HEAD WITHOUT CONTRAST TECHNIQUE: Contiguous axial images were obtained from the base of the skull through the vertex without intravenous contrast. RADIATION DOSE REDUCTION: This exam was performed according to the departmental dose-optimization program which includes automated exposure control, adjustment of the mA and/or kV according to patient size and/or use of iterative reconstruction technique. COMPARISON:  Head CT 05/29/2020. FINDINGS: Brain: Moderate cerebral atrophy.  Advanced patchy and ill-defined hypoattenuation within the cerebral white matter, nonspecific but compatible with chronic small vessel ischemic disease. There is no acute intracranial hemorrhage. No demarcated cortical infarct. No extra-axial fluid collection. No evidence of an intracranial mass. No midline shift. Vascular: No hyperdense vessel. Atherosclerotic calcifications. Skull: No fracture or aggressive osseous lesion. Sinuses/Orbits: No mass or acute finding within the imaged orbits. No significant paranasal sinus disease at the imaged levels. Other: Left mastoid effusion. ASPECTS ([place] Stroke Program Early CT Score) - Ganglionic level infarction (caudate, lentiform nuclei, internal capsule, insula, M1-M3 cortex): 7 - Supraganglionic infarction (M4-M6 cortex): 3 Total score (0-10 with 10 being normal): 10 These results were called by telephone at the time of interpretation on 07/27/2022 at 1:12 pm to provider Dr. Sabra Heck, who verbally acknowledged these results. IMPRESSION: No evidence of acute intracranial abnormality. Advanced chronic small  vessel ischemic changes within the cerebral white matter. Moderate generalized cerebral atrophy. Left mastoid effusion. Electronically Signed   By: Kellie Simmering D.O.   On: 07/27/2022 13:13    EKG: Pending.   Assessment/Plan Principal Problem:   Acute metabolic encephalopathy Active Problems:   Severe sepsis (HCC)   KIDNEY TRANSPLANTATION   HTN (hypertension)  Assessment and Plan: * Acute metabolic encephalopathy Altered mental status, with confusion, not following directions, not responding to questions, episode of fecal incontinence.  No slurred speech or other focal neurologic deficits.  Febrile to 102.6.  Leukocytosis of 15.3.  Meeting severe sepsis criteria with acute metabolic encephalopathy.  Normal lactic acid 1.2 > 0.9.  Has baseline dementia. -Head CT negative for acute abnormality, brain MRI-no acute intracranial abnormality, chronic small  vessel ischemic changes, no intracranial large vessel occlusion identified. -Evaluated by teleneurologist, to consider delirium versus seizure versus infection versus other. Doubt stroke. -Failed bedside swallow eval, remain n.p.o. -Hold donepezil  Severe sepsis ([place]) Meeting severe sepsis criteria with fever to 102.6, leukocytosis of 15.3.  With normal lactic acid of 1.2 > 0.9.  With evidence of endorgan dysfunction-acute encephalopathy.  As of infection likely urinary-UA with positive nitrates and small leukocytes.  X-ray clear. -Continue IV ceftriaxone 1 g daily -500 mill bolus, continue N/s 100cc/hr x 15hrs -Follow-up blood cultures and urine cultures  HTN (hypertension) Blood pressure stable. -Hold Norvasc while n.p.o.  KIDNEY TRANSPLANTATION History of kidney transplant on immunosuppressants tacrolimus and mycophenolate.  Creatinine 0.73. -Hold occasions for now, she failed bedside swallow evaluation  -Spouse insistent that patient gets her home oral immunosuppressant medications, I explained that patient has failed her bedside swallow evaluation, also want to hold in the setting of acute infection.  Spouse is asking about IV formulation.  Consulted pharmacy.  Best option would be to hold mycophenolate in the setting of acute infection, place can place NG tube to give tacrolimus.  But we do not have the sublingual form here.   -  reevaluate in a.m. if mental status has improved and resume home meds.    DVT prophylaxis: Lovenox Code Status: Full code-confirmed with patient's son Bo at bedside. Family Communication: Patient's son Lesly Rubenstein at bedside.  Spouse not present. Disposition Plan: > 2 days Consults called: None Admission status: Inpatient, telemetry I certify that at the point of admission it is my clinical judgment that the patient will require inpatient hospital care spanning beyond 2 midnights from the point of admission due to high intensity of service, high risk for further  deterioration and high frequency of surveillance required.   Author: Bethena Roys, MD 07/27/2022 9:26 PM  For on call review [email or web address].

## 2022-07-27 NOTE — Assessment & Plan Note (Addendum)
Altered mental status, with confusion, not following directions, not responding to questions, episode of fecal incontinence.  No slurred speech or other focal neurologic deficits.  Febrile to 102.6.  Leukocytosis of 15.3.  Meeting severe sepsis criteria with acute metabolic encephalopathy.  Normal lactic acid 1.2 > 0.9.  Has baseline dementia. -Head CT negative for acute abnormality, brain MRI-no acute intracranial abnormality, chronic small vessel ischemic changes, no intracranial large vessel occlusion identified. -Evaluated by teleneurologist, to consider delirium versus seizure versus infection versus other. Doubt stroke. -Failed bedside swallow eval, remain n.p.o. -Hold donepezil

## 2022-07-27 NOTE — ED Provider Notes (Signed)
[facility] EMERGENCY DEPARTMENT Provider Note   CSN: [id number] Arrival date & time: 07/27/22  1253  An emergency department physician performed an initial assessment on this suspected stroke patient at 1254.  History  Chief Complaint  Patient presents with   Code Stroke    Kristine [name] is a 81 y.o. female.  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  Level 5 caveat: Altered mental status  Per EMS the husband called due to abnormal his behavior.  States that he went to play golf around 930 and the patient was acting normally.  States that at baseline she is able to walk around without assistance, perform ADLs, but when he returned she was in a chair slumped over to the right and had defecated on herself.  States that she was also not able to answer questions.  On my assessment the patient is able to tell me her first name and her date of birth but she is unable to tell me the year, location.  She was called a code stroke on arrival and initially evaluated by Dr. Sabra Heck, ED attending.  She was then evaluated by Dr. Cheral Marker through teleneurology as a code stroke.  HPI     Home Medications Prior to Admission medications   Medication Sig Start Date End Date Taking? Authorizing Provider  amLODipine (NORVASC) 2.5 MG tablet Take 2.5 mg by mouth at bedtime.  03/02/20  Yes [provider]  aspirin EC 81 MG tablet Take 81 mg by mouth daily.   Yes [provider]  Cholecalciferol (VITAMIN D) 2000 UNITS CAPS Take 2,000 Units by mouth daily.    Yes [provider]  donepezil (ARICEPT) 10 MG tablet Take 10 mg by mouth daily. 07/17/22  Yes [provider]  estradiol (ESTRACE VAGINAL) 0.1 MG/GM vaginal cream Use 0.5 gm in vagina 2-3 x weekly 10/20/21  Yes Derrek Monaco A, NP  mycophenolate (MYFORTIC) 180 MG  EC tablet Take 540 mg by mouth 2 (two) times daily.   Yes [provider]  simvastatin (ZOCOR) 20 MG tablet Take 20 mg by mouth at bedtime.    Yes [provider]  solifenacin (VESICARE) 10 MG tablet Take 10 mg by mouth daily. 07/10/22  Yes [provider]  tacrolimus (PROGRAF) 0.5 MG capsule Take 0.5 mg by mouth 2 (two) times daily.    Yes [provider]      Allergies    Patient has no known allergies.    Review of Systems   Review of Systems  Unable to perform ROS: Mental status change    Physical Exam Updated Vital Signs BP (!) 133/50   Pulse 77   Temp (!) 101.3 F (38.5 C)   Resp (!) 22   Ht '5\' 1"'$  (1.549 m)   Wt 50.5 kg   SpO2 100%   BMI 21.04 kg/m  Physical Exam Vitals and nursing note reviewed.  Constitutional:      General: She is in acute distress.     Appearance: She is ill-appearing.  HENT:     Head: Normocephalic and atraumatic.     Nose: No congestion.     Mouth/Throat:     Mouth: Mucous membranes are dry.     Pharynx: Oropharynx is clear.  Eyes:     General: No scleral icterus.    Extraocular Movements: Extraocular movements intact.  Cardiovascular:  Rate and Rhythm: Normal rate and regular rhythm.     Pulses:          Radial pulses are 2+ on the right side and 2+ on the left side.       Dorsalis pedis pulses are 1+ on the right side and 1+ on the left side.     Heart sounds: No murmur heard. Pulmonary:     Effort: Pulmonary effort is normal.     Breath sounds: Normal breath sounds.  Abdominal:     General: Bowel sounds are normal. There is no distension.     Palpations: Abdomen is soft.     Tenderness: There is no abdominal tenderness.  Musculoskeletal:        General: Normal range of motion.     Cervical back: Neck supple.     Right lower leg: No edema.     Left lower leg: No edema.  Skin:    General: Skin is warm and dry.     Capillary Refill: Capillary refill takes less than 2 seconds.  Neurological:      Mental Status: She is alert. She is disoriented and confused.     GCS: GCS eye subscore is 4. GCS verbal subscore is 5. GCS motor subscore is 5.     Cranial Nerves: Dysarthria present. No facial asymmetry.     Sensory: Sensation is intact.     Motor: Weakness present. No pronator drift.     Coordination: Finger-Nose-Finger Test abnormal. Heel to Shin Test normal.     Comments: Global weakness     ED Results / Procedures / Treatments   Labs (all labs ordered are listed, but only abnormal results are displayed) Labs Reviewed  CBC - Abnormal; Notable for the following components:      Result Value   WBC 15.3 (*)    Platelets 131 (*)    All other components within normal limits  DIFFERENTIAL - Abnormal; Notable for the following components:   Neutro Abs 14.0 (*)    Lymphs Abs 0.4 (*)    Abs Immature Granulocytes 0.08 (*)    All other components within normal limits  COMPREHENSIVE METABOLIC PANEL - Abnormal; Notable for the following components:   Sodium 131 (*)    Glucose, Bld 145 (*)    Calcium 8.8 (*)    All other components within normal limits  URINALYSIS, ROUTINE W REFLEX MICROSCOPIC - Abnormal; Notable for the following components:   Hgb urine dipstick MODERATE (*)    Protein, ur 30 (*)    Nitrite POSITIVE (*)    Leukocytes,Ua SMALL (*)    Bacteria, UA FEW (*)    All other components within normal limits  I-STAT CHEM 8, ED - Abnormal; Notable for the following components:   Sodium 132 (*)    Glucose, Bld 144 (*)    Calcium, Ion 1.10 (*)    All other components within normal limits  RESP PANEL BY RT-PCR (FLU A&B, COVID) ARPGX2  CULTURE, BLOOD (ROUTINE X 2)  CULTURE, BLOOD (ROUTINE X 2)  URINE CULTURE  ETHANOL  PROTIME-INR  APTT  RAPID URINE DRUG SCREEN, HOSP PERFORMED  LACTIC ACID, PLASMA  LACTIC ACID, PLASMA  AMMONIA    EKG None  Radiology MR BRAIN WO CONTRAST  Result Date: 07/27/2022 CLINICAL DATA:  Provided history: Neuro deficit, acute, stroke  suspected. EXAM: MRI HEAD WITHOUT CONTRAST MRA HEAD WITHOUT CONTRAST TECHNIQUE: Multiplanar, multi-echo pulse sequences of the brain and surrounding structures were acquired without intravenous contrast.  Angiographic images of the Circle of Willis were acquired using MRA technique without intravenous contrast. COMPARISON:  Head CT 07/27/2022. FINDINGS: MRI HEAD FINDINGS Brain: Moderate cerebral atrophy. Advanced multifocal T2 FLAIR hyperintense signal abnormality within the cerebral white matter, nonspecific but compatible chronic small vessel image disease. Minimal chronic small-vessel ischemic changes also present within the pons. 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 There is no acute infarct. No evidence of an intracranial mass. No extra-axial fluid collection. No midline shift. Vascular: Maintained flow voids within the proximal large arterial vessels. Skull and upper cervical spine: No focal suspicious marrow lesion. Incompletely assessed cervical spondylosis. Sinuses/Orbits: No mass or acute finding within the imaged orbits. No significant paranasal sinus disease. Other: Left mastoid effusion. MRA HEAD FINDING: Anterior circulation: The intracranial internal carotid arteries are patent. The M1 middle cerebral arteries are patent. No M2 proximal branch occlusion or high-grade proximal stenosis. The anterior cerebral arteries are patent. 2 mm broad-based medially projecting vascular protrusion arising from the cavernous left ICA, which may reflect atherosclerotic lobulation or an aneurysm (for instance as seen on series 1035, image 19). Posterior circulation: The intracranial vertebral arteries are patent. Apparent moderate to severe stenosis within the left PCA at the P3/P4 junction (series 1047, image 15). The right PCA is fetal in origin. A left posterior communicating artery is present. Anatomic variants: As described. IMPRESSION: MRI brain: 1. No evidence of acute intracranial  abnormality. 2. Chronic small-vessel ischemic changes which are advanced in the cerebral white matter, and minimal in the pons. 3. 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 4. Moderate cerebral atrophy. 5. Left mastoid effusion. MRA head: 1. No intracranial large vessel occlusion is identified. 2. Apparent moderate/severe stenosis within the left PCA at the P3/P4 junction. 3. 2 mm broad-based medially projecting vascular protrusion arising from the cavernous left ICA, which may reflect an aneurysm or atherosclerotic lobulation. Electronically Signed   By: Kellie Simmering D.O.   On: 07/27/2022 15:05   MR ANGIO HEAD WO CONTRAST  Result Date: 07/27/2022 CLINICAL DATA:  Provided history: Neuro deficit, acute, stroke suspected. EXAM: MRI HEAD WITHOUT CONTRAST MRA HEAD WITHOUT CONTRAST TECHNIQUE: Multiplanar, multi-echo pulse sequences of the brain and surrounding structures were acquired without intravenous contrast. Angiographic images of the Circle of Willis were acquired using MRA technique without intravenous contrast. COMPARISON:  Head CT 07/27/2022. FINDINGS: MRI HEAD FINDINGS Brain: Moderate cerebral atrophy. Advanced multifocal T2 FLAIR hyperintense signal abnormality within the cerebral white matter, nonspecific but compatible chronic small vessel image disease. Minimal chronic small-vessel ischemic changes also present within the pons. 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 There is no acute infarct. No evidence of an intracranial mass. No extra-axial fluid collection. No midline shift. Vascular: Maintained flow voids within the proximal large arterial vessels. Skull and upper cervical spine: No focal suspicious marrow lesion. Incompletely assessed cervical spondylosis. Sinuses/Orbits: No mass or acute finding within the imaged orbits. No significant paranasal sinus disease. Other: Left mastoid effusion. MRA HEAD FINDING: Anterior circulation: The  intracranial internal carotid arteries are patent. The M1 middle cerebral arteries are patent. No M2 proximal branch occlusion or high-grade proximal stenosis. The anterior cerebral arteries are patent. 2 mm broad-based medially projecting vascular protrusion arising from the cavernous left ICA, which may reflect atherosclerotic lobulation or an aneurysm (for instance as seen on series 1035, image 19). Posterior circulation: The intracranial vertebral arteries are patent. Apparent moderate to severe stenosis within the left PCA at the P3/P4 junction (  series 1047, image 15). The right PCA is fetal in origin. A left posterior communicating artery is present. Anatomic variants: As described. IMPRESSION: MRI brain: 1. No evidence of acute intracranial abnormality. 2. Chronic small-vessel ischemic changes which are advanced in the cerebral white matter, and minimal in the pons. 3. 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 4. Moderate cerebral atrophy. 5. Left mastoid effusion. MRA head: 1. No intracranial large vessel occlusion is identified. 2. Apparent moderate/severe stenosis within the left PCA at the P3/P4 junction. 3. 2 mm broad-based medially projecting vascular protrusion arising from the cavernous left ICA, which may reflect an aneurysm or atherosclerotic lobulation. Electronically Signed   By: Kellie Simmering D.O.   On: 07/27/2022 15:05   DG Chest Port 1 View  Result Date: 07/27/2022 CLINICAL DATA:  Code stroke, evaluate for pneumonia EXAM: PORTABLE CHEST 1 VIEW COMPARISON:  06/20/2022 FINDINGS: The heart size and mediastinal contours are within normal limits. Both lungs are clear. The visualized skeletal structures are unremarkable. IMPRESSION: No active disease. Electronically Signed   By: Elmer Picker M.D.   On: 07/27/2022 14:24   CT HEAD CODE STROKE WO CONTRAST  Result Date: 07/27/2022 CLINICAL DATA:  Code stroke. Neuro deficit, acute, stroke suspected. Altered mental  status, last known well 9:30 a.m., abnormal gait. EXAM: CT HEAD WITHOUT CONTRAST TECHNIQUE: Contiguous axial images were obtained from the base of the skull through the vertex without intravenous contrast. RADIATION DOSE REDUCTION: This exam was performed according to the departmental dose-optimization program which includes automated exposure control, adjustment of the mA and/or kV according to patient size and/or use of iterative reconstruction technique. COMPARISON:  Head CT 05/29/2020. FINDINGS: Brain: Moderate cerebral atrophy. Advanced patchy and ill-defined hypoattenuation within the cerebral white matter, nonspecific but compatible with chronic small vessel ischemic disease. There is no acute intracranial hemorrhage. No demarcated cortical infarct. No extra-axial fluid collection. No evidence of an intracranial mass. No midline shift. Vascular: No hyperdense vessel. Atherosclerotic calcifications. Skull: No fracture or aggressive osseous lesion. Sinuses/Orbits: No mass or acute finding within the imaged orbits. No significant paranasal sinus disease at the imaged levels. Other: Left mastoid effusion. ASPECTS (Rew Stroke Program Early CT Score) - Ganglionic level infarction (caudate, lentiform nuclei, internal capsule, insula, M1-M3 cortex): 7 - Supraganglionic infarction (M4-M6 cortex): 3 Total score (0-10 with 10 being normal): 10 These results were called by telephone at the time of interpretation on 07/27/2022 at 1:12 pm to provider Dr. Sabra Heck, who verbally acknowledged these results. IMPRESSION: No evidence of acute intracranial abnormality. Advanced chronic small vessel ischemic changes within the cerebral white matter. Moderate generalized cerebral atrophy. Left mastoid effusion. Electronically Signed   By: Kellie Simmering D.O.   On: 07/27/2022 13:13    Procedures Procedures   Medications Ordered in ED Medications  acetaminophen (TYLENOL) suppository 650 mg (650 mg Rectal Given 07/27/22 1354)   cefTRIAXone (ROCEPHIN) 1 g in sodium chloride 0.9 % 100 mL IVPB (0 g Intravenous Stopped 07/27/22 1554)  acetaminophen (TYLENOL) suppository 650 mg (650 mg Rectal Given 07/27/22 1721)    ED Course/ Medical Decision Making/ A&P                           Medical Decision Making Amount and/or Complexity of Data Reviewed Labs: ordered. Radiology: ordered.  Risk OTC drugs. Decision regarding hospitalization.   This patient presents to the ED with chief complaint(s) of altered mental status with pertinent past medical history of  renal transplant, HTN, vascular dementia which further complicates the presenting complaint. The complaint involves an extensive differential diagnosis and treatment options and also carries with it a high risk of complications and morbidity.    The differential diagnosis includes electrolyte derangement, toxidrome, hyper or hypoglycemia, ingestion, seizure, stroke, CNS infection, encephalopathy such as uremia, acidosis, etc.    Additional history obtained: Additional history obtained from spouse and EMS  Records reviewed Care Everywhere/External Records and Primary Care Documents  ED Course: Lab Tests: I Ordered, and personally interpreted labs.  The pertinent results include:   Leukocytosis to 15.3 Glucose 145 Lactic negative  COVID/flu negative UA + UTI  Imaging Studies: I ordered and independently visualized and interpreted the following imaging CT scan head and X-ray chest   which showed no acute stroke, CXR without pneumonia, MR Brain and MR angio head show no acute infarct   The interpretation of the imaging was limited to assessing for emergent pathology, for which purpose it was ordered. Cardiac Monitoring: The patient was maintained on a cardiac monitor.  I personally viewed and interpreted the cardiac monitor which showed an underlying rhythm of:  sinus rhythm Medicines ordered and prescription drug management: I ordered the following medications  tylenol for fever, rocephin for infection  I considered this additional medications: broader antibiotic coverage Reevaluation of the patient after these medicines showed that the patient    stayed the same  Reassessment and review: 81 year old female who presents as code stroke to ED   Immediately evaluated by neurology Dr. Cheral Marker who feels this is more delirium 2/2 seizure vs infection vs other and not stroke related. CT Head Stroke without obvious bleed or infarction. After back from CT nursing obtained temp which was 102.2. Given rectal tylenol. Will add on infectious work-up. I added on COVID/flu, UA, lactic (for possible seizure), CXR. Started on Rocephin until known cause of fever.   Initial labs with WBC 15.3 pointing to possible infection. Lactic is negative. No evidence of tongue biting on exam. She did defecate herself at home. Possible seizure but negative lactic would suggest against this.  No severe electrolyte derangement to contribute. No uremia. No hypo or hyperglycemia. Alcohol negative.   CXR without pneumonia, pleural effusion, PTX.  EKG without arrhythmia  Neurology added on MR Brain and MR angio head which revealed no acute infarcts.  COVID and flu negative UA with UTI. Rocephin appropriate medication for now. Patient mental status slightly improving. Able to tell city and place at this time. Will need admission for delirium, encephalopathy likely secondary to UTI. Consulted and spoke with Dr. Denton Brick, hospitalist who agrees to admit patient.    Consultations Obtained: I requested consultation with the consultant Dr. Cheral Marker, neurology , and discussed  findings as well as pertinent plan - they recommend: delirium work up. Dr. Denton Brick, hospitalist for admission  Complexity of problems addressed: Patient's presentation is most consistent with  acute presentation with potential threat to life or bodily function During patient's assessment  Disposition: After  consideration of the diagnostic results and the patient's response to treatment,  I feel that the patent would benefit from admission hospitalist .  Social Determinants of Health: Patient's  none identified    increases the complexity of managing their presentation  Final Clinical Impression(s) / ED Diagnoses Final diagnoses:  Delirium  Acute cystitis without hematuria    Rx / DC Orders ED Discharge Orders     None         Mickie Hillier, PA-C 07/27/22  Sims, Brian, MD 07/28/22 1225

## 2022-07-27 NOTE — Assessment & Plan Note (Addendum)
History of kidney transplant on immunosuppressants tacrolimus and mycophenolate.  Creatinine 0.73. -Hold occasions for now, she failed bedside swallow evaluation  -Spouse insistent that patient gets her home oral immunosuppressant medications, I explained that patient has failed her bedside swallow evaluation, also want to hold in the setting of acute infection.  Spouse is asking about IV formulation.  Consulted pharmacy.  Best option would be to hold mycophenolate in the setting of acute infection, place can place NG tube to give tacrolimus.  But we do not have the sublingual form here.   -  reevaluate in a.m. if mental status has improved and resume home meds.

## 2022-07-27 NOTE — ED Notes (Signed)
Beeped and called LAB and CT @ 2174.

## 2022-07-27 NOTE — Assessment & Plan Note (Signed)
Meeting severe sepsis criteria with fever to 102.6, leukocytosis of 15.3.  With normal lactic acid of 1.2 > 0.9.  With evidence of endorgan dysfunction-acute encephalopathy.  As of infection likely urinary-UA with positive nitrates and small leukocytes.  X-ray clear. -Continue IV ceftriaxone 1 g daily -500 mill bolus, continue N/s 100cc/hr x 15hrs -Follow-up blood cultures and urine cultures

## 2022-07-27 NOTE — Consult Note (Addendum)
[facility] TeleNeurology Consult Services    Date of Service:  07/27/2022     Metrics: Last Known Well: 0930 Symptoms: As per HPI.  Patient is not a candidate for thrombolytic.   Location of the provider: [facility]  Location of the patient: [facility] Pre-Morbid Modified Rankin Scale: 0 Time Code Stroke Page received:  12:53 PM Time neurologist arrived:  12:55 PM Time NIHSS completed: 1:26 PM    This consult was provided via telemedicine with 2-way video and audio communication. The patient/family was informed that care would be provided in this way and agreed to receive care in this manner.   ED Physician notified of diagnostic impression and management plan at: 1:39 PM   Assessment:  81 year old female with a PMHx of CKD s/p renal transplant in 2009, HTN and vascular dementia who presents with acute onset of AMS and dysphasia.    - Exam reveals abulia with confusion and minimal speech output with NIHSS of 6. There is mild decreased prominence of right NL fold, but perioral muscles contract equally during speech and grimacing. Also appears fatigued and ill, with one beat of asterixis noted bilaterally during testing of drift. Periodically coughs during the exam and also demonstrates significantly warm skin per nursing in conjunction with an axillary temp of 102.2.   - CBC reveals a white count of 44.9 with neutrophilic predominance. Glucose is 145. Mildly hyponatremic. .  - CT head: No evidence of acute intracranial abnormality. Advanced chronic small vessel ischemic changes within the cerebral white matter. Moderate generalized cerebral atrophy. - Overall presentation is significantly more likely to be secondary to a toxic, metabolic or infectious encephalopathy than stroke. Unwitnessed seizure with postictal state is also possible, but significantly lower on the DDx.  - Given low likelihood of stroke, potential benefits of TNK are  significantly outweighed by the risks. Overall presentation is not consistent with LVO. Given her history of renal transplantation, risks of IV contrast administration regarding overall long term morbidity/mortality from possible contrast-induced renal failure are felt to outweigh the potential diagnostic benefits.  - Above discussed with the patient's husband who expressed understanding and agreement with the plan.      Recommendations: - Toxic/metabolic/infectious work up per ED and Hospitalist teams. - Discussed with EDP      ------------------------------------------------------------------------------   History of Present Illness: The patient is an 81 year old female with a PMHx of CKD s/p renal transplant in 2009, HTN and vascular dementia who presents with acute onset of AMS and dysphasia.    Husband left home at [phone] to play golf with wife normal at that time, then on returning home at 50 he found her slumped over the kitchen table with slurred speech, AMS and bowel incontinence in her Depends. No jerking or twitching endorsed by husband. He called EMS and on their arrival her CBG was 137. In the ED, there was no evidence for trauma or tongue-bite.   Ambulates independently at baseline without a walker or a cane, but is "real slow".   She got flu and Covid vaccinations 2 weeks ago.      Past Medical History: Past Medical History:  Diagnosis Date   Chronic kidney disease    had kidney transplant  Vascular dementia HTN   Past Surgical History: Past Surgical History:  Procedure Laterality Date   CHOLECYSTECTOMY     COLONOSCOPY  08/28/2012   Procedure: COLONOSCOPY;  Surgeon: Rogene Houston, MD;  Location: [facility];  Service: Endoscopy;  Laterality: N/A;  930   COLONOSCOPY WITH PROPOFOL N/A 07/14/2020   Procedure: COLONOSCOPY WITH PROPOFOL;  Surgeon: Rogene Houston, MD;  Location: [facility];  Service: Endoscopy;  Laterality: N/A;  930   HYSTEROSCOPY WITH D & C N/A  01/11/2021   Procedure: DILATATION AND CURETTAGE /HYSTEROSCOPY;  Surgeon: [name], MD;  Location: [facility];  Service: Gynecology;  Laterality: N/A;   KIDNEY TRANSPLANT     orif left wrist     TONSILLECTOMY       Medications:  No current facility-administered medications on file prior to encounter.   Current Outpatient Medications on File Prior to Encounter  Medication Sig Dispense Refill   amLODipine (NORVASC) 2.5 MG tablet Take 2.5 mg by mouth at bedtime.      aspirin EC 81 MG tablet Take 81 mg by mouth daily.     Cholecalciferol (VITAMIN D) 2000 UNITS CAPS Take 2,000 Units by mouth daily.      estradiol (ESTRACE VAGINAL) 0.1 MG/GM vaginal cream Use 0.5 gm in vagina 2-3 x weekly 42.5 g 3   mycophenolate (MYFORTIC) 180 MG EC tablet Take 540 mg by mouth 2 (two) times daily.     simvastatin (ZOCOR) 20 MG tablet Take 20 mg by mouth at bedtime.      tacrolimus (PROGRAF) 0.5 MG capsule Take 0.5 mg by mouth 2 (two) times daily.           Social History: Quit smoking 40 years ago  Family History:  Reviewed in Epic   ROS: As per HPI    Anticoagulant use:  None   Antiplatelet use: ASA   Examination:   BP 121/73   Pulse 79   Ht '5\' 1"'$  (1.549 m)   Wt 50.5 kg   SpO2 96%   BMI 21.04 kg/m  Axillary temp 102.2     1A: Level of Consciousness - 0 1B: Ask Month and Age - 2 1C: Blink Eyes & Squeeze Hands - 0 2: Test Horizontal Extraocular Movements - 0 3: Test Visual Fields - 0 4: Test Facial Palsy (Use Grimace if Obtunded) - 1 (decreased right NL fold) 5A: Test Left Arm Motor Drift - 0 5B: Test Right Arm Motor Drift - 0 6A: Test Left Leg Motor Drift - 0 6B: Test Right Leg Motor Drift - 0 7: Test Limb Ataxia (FNF/Heel-Shin) - 0 8: Test Sensation -  0 9: Test Language/Aphasia - 2 10: Test Dysarthria - Severe Dysarthria: 1 11: Test Extinction/Inattention - Extinction to bilateral simultaneous stimulation 0   NIHSS Score: 6     Patient/Family was informed the  Neurology Consult would occur via TeleHealth consult by way of interactive audio and video telecommunications and consented to receiving care in this manner.   Patient is being evaluated for possible acute neurologic impairment and high pretest probability of imminent or life-threatening deterioration. I spent total of 40 minutes providing care to this patient, including time for face to face visit via telemedicine, review of medical records, imaging studies and discussion of findings with providers, the patient and/or family.   Electronically signed: Dr. Kerney Elbe

## 2022-07-27 NOTE — ED Notes (Signed)
EMS called [facility] @ 1242.

## 2022-07-27 NOTE — Progress Notes (Signed)
Received after hours call on our answering service from Kristine [name]. He is concerned that she is not able to receive PO anti-rejection medications given that she has not passed swallow eval. He informed me that Kristine [name] does not have tacrolimus IV at [facility]. Reassured him that it is okay to miss tonight's dose of myfortic. Hopefully will pass swallow eval soon and can resume PO meds. If not, will need pharmacy's assistance in regards to obtaining IV meds/dosing. Please call if consultation is required from a nephrology perspective.  Gean Quint, MD [facility]

## 2022-07-28 DIAGNOSIS — G9341 Metabolic encephalopathy: Secondary | ICD-10-CM | POA: Diagnosis not present

## 2022-07-28 DIAGNOSIS — L8990 Pressure ulcer of unspecified site, unspecified stage: Secondary | ICD-10-CM | POA: Insufficient documentation

## 2022-07-28 LAB — CBC
HCT: 38.9 % (ref 36.0–46.0)
Hemoglobin: 12.3 g/dL (ref 12.0–15.0)
MCH: 28.7 pg (ref 26.0–34.0)
MCHC: 31.6 g/dL (ref 30.0–36.0)
MCV: 90.9 fL (ref 80.0–100.0)
Platelets: 129 10*3/uL — ABNORMAL LOW (ref 150–400)
RBC: 4.28 MIL/uL (ref 3.87–5.11)
RDW: 13.9 % (ref 11.5–15.5)
WBC: 14.1 10*3/uL — ABNORMAL HIGH (ref 4.0–10.5)
nRBC: 0 % (ref 0.0–0.2)

## 2022-07-28 LAB — BASIC METABOLIC PANEL
Anion gap: 9 (ref 5–15)
BUN: 13 mg/dL (ref 8–23)
CO2: 22 mmol/L (ref 22–32)
Calcium: 8.5 mg/dL — ABNORMAL LOW (ref 8.9–10.3)
Chloride: 104 mmol/L (ref 98–111)
Creatinine, Ser: 0.7 mg/dL (ref 0.44–1.00)
GFR, Estimated: >60 mL/min (ref >60)
Glucose, Bld: 94 mg/dL (ref 70–99)
Potassium: 4.2 mmol/L (ref 3.5–5.1)
Sodium: 135 mmol/L (ref 135–145)

## 2022-07-28 LAB — URINE CULTURE: Culture: NO GROWTH

## 2022-07-28 LAB — GLUCOSE, CAPILLARY: Glucose-Capillary: 137 mg/dL — ABNORMAL HIGH (ref 70–99)

## 2022-07-28 MED ORDER — ASPIRIN 81 MG PO TBEC
81.0000 mg | DELAYED_RELEASE_TABLET | Freq: Every day | ORAL | Status: DC
  Administered 2022-07-28 – 2022-07-29 (×2): 81 mg via ORAL
  Filled 2022-07-28 (×2): qty 1

## 2022-07-28 MED ORDER — DONEPEZIL HCL 5 MG PO TABS
10.0000 mg | ORAL_TABLET | Freq: Every day | ORAL | Status: DC
  Administered 2022-07-28 – 2022-07-29 (×2): 10 mg via ORAL
  Filled 2022-07-28 (×2): qty 2

## 2022-07-28 MED ORDER — AMLODIPINE BESYLATE 5 MG PO TABS
2.5000 mg | ORAL_TABLET | Freq: Every day | ORAL | Status: DC
  Administered 2022-07-28: 2.5 mg via ORAL
  Filled 2022-07-28: qty 1

## 2022-07-28 MED ORDER — SIMVASTATIN 20 MG PO TABS
20.0000 mg | ORAL_TABLET | Freq: Every day | ORAL | Status: DC
  Administered 2022-07-28: 20 mg via ORAL
  Filled 2022-07-28: qty 1

## 2022-07-28 MED ORDER — TACROLIMUS 0.5 MG PO CAPS: 0.5000 mg | ORAL_CAPSULE | Freq: Two times a day (BID) | ORAL | Status: DC

## 2022-07-28 MED ORDER — MYCOPHENOLATE SODIUM 180 MG PO TBEC
540.0000 mg | DELAYED_RELEASE_TABLET | Freq: Two times a day (BID) | ORAL | Status: DC
  Administered 2022-07-28 – 2022-07-29 (×3): 540 mg via ORAL

## 2022-07-28 MED ORDER — TACROLIMUS 0.5 MG PO CAPS
1.0000 mg | ORAL_CAPSULE | Freq: Two times a day (BID) | ORAL | Status: DC
  Administered 2022-07-28 – 2022-07-29 (×3): 1 mg via ORAL
  Filled 2022-07-28 (×3): qty 2

## 2022-07-28 NOTE — Care Management Important Message (Signed)
Important Message  Patient Details  Name: Kristine [name] MRN: [id number] Date of Birth: 18-Feb-1941   Medicare Important Message Given:  Yes     Tommy Medal 07/28/2022, 11:53 AM

## 2022-07-28 NOTE — Progress Notes (Signed)
Code stroke  Call time  [id number]   [id number] [id number]   5956 LOV   564 PPI   951 OACZ   660 Rad called  103

## 2022-07-28 NOTE — Progress Notes (Addendum)
PROGRESS NOTE  Kristine [name] UJW:[id number] DOB: 23-Dec-1940 DOA: 07/27/2022 PCP: Asencion Noble, MD   LOS: 1 day   Brief Narrative / Interim history: Kristine [name] is a 81 y.o. female with medical history significant for kidney transplant, dementia. Patient was brought to the ED via EMS reports of altered mental status.  Per family, she was in her normal state of health in the morning of the day of admission, then the spouse went golfing, when he came back around noon she was slumped over in the chair on the right side, has been incontinent and could not respond to questions.  There are reports of patient having slurred speech.  At baseline, she has some short-term memory deficits but usually is ambulatory without assistance or assistive devices, she is able to answer questions and can hold a conversation.  Subjective / 24h Interval events: She is doing well this morning.  She is not sure where she is, initially thought she was in [place] at [facility].  Does not remember much of yesterday  Assesement and Plan: Principal Problem:   Acute metabolic encephalopathy Active Problems:   Severe sepsis ([place])   KIDNEY TRANSPLANTATION   HTN (hypertension)   Pressure injury of skin   Principal problem Acute metabolic encephalopathy, underlying mild dementia-in the setting of febrile illness, she is much better this morning.  She can carry a conversation, is alert, and otherwise feels close to baseline.  Continue to monitor -Initially presented as a code stroke, MRI of the brain was without acute intracranial abnormalities, and MRA did not show any LVO.  Continue aspirin -Continue home Aricept  Active problems Severe sepsis-met sepsis criteria with fever, white count, encephalopathy.  Source most likely urinary, has been placed on ceftriaxone, continue, monitor blood and urine cultures.  WBC improving and she is afebrile this morning  Kidney transplant recipient-continue immunosuppressants.  Renal  function is stable  Thrombocytopenia-mild, likely due to sepsis.  No bleeding  Essential hypertension-continue Norvasc  Family is thinking about her being transferred to [facility] as is it closer to home  Scheduled Meds:  amLODipine  2.5 mg Oral QHS   aspirin EC  81 mg Oral Daily   donepezil  10 mg Oral Daily   enoxaparin (LOVENOX) injection  40 mg Subcutaneous Q24H   mycophenolate  540 mg Oral BID   simvastatin  20 mg Oral QHS   tacrolimus  1 mg Oral BID   Continuous Infusions:  sodium chloride 100 mL/hr at 07/27/22 2047   cefTRIAXone (ROCEPHIN)  IV     lactated ringers     PRN Meds:.acetaminophen **OR** acetaminophen, ondansetron **OR** ondansetron (ZOFRAN) IV, polyethylene glycol  Current Outpatient Medications  Medication Instructions   amLODipine (NORVASC) 2.5 mg, Oral, Daily at bedtime   aspirin EC 81 mg, Oral, Daily   donepezil (ARICEPT) 10 mg, Oral, Daily   estradiol (ESTRACE VAGINAL) 0.1 MG/GM vaginal cream Use 0.5 gm in vagina 2-3 x weekly   mycophenolate (MYFORTIC) 540 mg, Oral, 2 times daily   simvastatin (ZOCOR) 20 mg, Oral, Daily at bedtime   tacrolimus (PROGRAF) 0.5 mg, Oral, 2 times daily   Vitamin D 2,000 Units, Oral, Daily    Diet Orders (From admission, onward)     Start     Ordered   07/28/22 0834  Diet regular Room service appropriate? Yes; Fluid consistency: Thin  Diet effective now       Question Answer Comment  Room service appropriate? Yes   Fluid consistency: Thin  07/28/22 0833            DVT prophylaxis: enoxaparin (LOVENOX) injection 40 mg Start: 07/27/22 2115   Lab Results  Component Value Date   PLT 129 (L) 07/28/2022      Code Status: Full Code  Family Communication: husband at bedside   Status is: Inpatient  Remains inpatient appropriate because: severity of illness, IV antibiotics while monitoring cultures  Level of care: Telemetry  Consultants:  None  Objective: Vitals:   07/27/22 2029 07/28/22 0126  07/28/22 0440 07/28/22 0700  BP: 129/69 126/72 133/70 125/61  Pulse: 77 67 61 62  Resp: 16 16 16 16   Temp: 99.5 F (37.5 C) 98.3 F (36.8 C) 98.6 F (37 C) 98.7 F (37.1 C)  TempSrc: Oral Oral Oral Oral  SpO2: 97% 100% 99% 98%  Weight:      Height:        Intake/Output Summary (Last 24 hours) at 07/28/2022 0941 Last data filed at 07/28/2022 0500 Gross per 24 hour  Intake 938.63 ml  Output --  Net 938.63 ml   Wt Readings from Last 3 Encounters:  07/27/22 50.5 kg  07/11/22 48.1 kg  09/05/21 49.1 kg    Examination:  Constitutional: NAD Eyes: no scleral icterus ENMT: Mucous membranes are moist.  Neck: normal, supple Respiratory: clear to auscultation bilaterally, no wheezing, no crackles.  Cardiovascular: Regular rate and rhythm, no murmurs / rubs / gallops. No LE edema.  Abdomen: non distended, no tenderness. Bowel sounds positive.  Musculoskeletal: no clubbing / cyanosis.  Skin: no rashes Neurologic: non focal   Data Reviewed: I have independently reviewed following labs and imaging studies   CBC Recent Labs  Lab 07/27/22 1300 07/27/22 1320 07/28/22 0314  WBC  --  15.3* 14.1*  HGB 15.0 13.4 12.3  HCT 44.0 41.6 38.9  PLT  --  131* 129*  MCV  --  88.9 90.9  MCH  --  28.6 28.7  MCHC  --  32.2 31.6  RDW  --  13.9 13.9  LYMPHSABS  --  0.4*  --   MONOABS  --  0.9  --   EOSABS  --  0.0  --   BASOSABS  --  0.0  --     Recent Labs  Lab 07/27/22 1300 07/27/22 1320 07/27/22 1335 07/27/22 1517 07/27/22 1719 07/28/22 0314  NA 132* 131*  --   --   --  135  K 4.8 4.1  --   --   --  4.2  CL 98 99  --   --   --  104  CO2  --  24  --   --   --  22  GLUCOSE 144* 145*  --   --   --  94  BUN 16 13  --   --   --  13  CREATININE 0.60 0.73  --   --   --  0.70  CALCIUM  --  8.8*  --   --   --  8.5*  AST  --  26  --   --   --   --   ALT  --  22  --   --   --   --   ALKPHOS  --  65  --   --   --   --   BILITOT  --  1.2  --   --   --   --   ALBUMIN  --  3.6  --    --   --   --  LATICACIDVEN  --   --  1.2 0.9  --   --   INR  --  1.1  --   --   --   --   AMMONIA  --   --   --   --  24  --     ------------------------------------------------------------------------------------------------------------------ No results for input(s): "CHOL", "HDL", "LDLCALC", "TRIG", "CHOLHDL", "LDLDIRECT" in the last 72 hours.  No results found for: "HGBA1C" ------------------------------------------------------------------------------------------------------------------ No results for input(s): "TSH", "T4TOTAL", "T3FREE", "THYROIDAB" in the last 72 hours.  Invalid input(s): "FREET3"  Cardiac Enzymes No results for input(s): "CKMB", "TROPONINI", "MYOGLOBIN" in the last 168 hours.  Invalid input(s): "CK" ------------------------------------------------------------------------------------------------------------------ No results found for: "BNP"  CBG: No results for input(s): "GLUCAP" in the last 168 hours.  Recent Results (from the past 240 hour(s))  Resp Panel by RT-PCR (Flu A&B, Covid) Anterior Nasal Swab     Status: None   Collection Time: 07/27/22  1:34 PM   Specimen: Anterior Nasal Swab  Result Value Ref Range Status   SARS Coronavirus 2 by RT PCR NEGATIVE NEGATIVE Final    Comment: (NOTE) SARS-CoV-2 target nucleic acids are NOT DETECTED.  The SARS-CoV-2 RNA is generally detectable in upper respiratory specimens during the acute phase of infection. The lowest concentration of SARS-CoV-2 viral copies this assay can detect is 138 copies/mL. A negative result does not preclude SARS-Cov-2 infection and should not be used as the sole basis for treatment or other patient management decisions. A negative result may occur with  improper specimen collection/handling, submission of specimen other than nasopharyngeal swab, presence of viral mutation(s) within the areas targeted by this assay, and inadequate number of viral copies(<138 copies/mL). A negative  result must be combined with clinical observations, patient history, and epidemiological information. The expected result is Negative.  Fact Sheet for Patients:  EntrepreneurPulse.com.au  Fact Sheet for Healthcare Providers:  IncredibleEmployment.be  This test is no t yet approved or cleared by the [place] FDA and  has been authorized for detection and/or diagnosis of SARS-CoV-2 by FDA under an Emergency Use Authorization (EUA). This EUA will remain  in effect (meaning this test can be used) for the duration of the COVID-19 declaration under Section 564(b)(1) of the Act, 21 U.S.C.section 360bbb-3(b)(1), unless the authorization is terminated  or revoked sooner.       Influenza A by PCR NEGATIVE NEGATIVE Final   Influenza B by PCR NEGATIVE NEGATIVE Final    Comment: (NOTE) The Xpert Xpress SARS-CoV-2/FLU/RSV plus assay is intended as an aid in the diagnosis of influenza from Nasopharyngeal swab specimens and should not be used as a sole basis for treatment. Nasal washings and aspirates are unacceptable for Xpert Xpress SARS-CoV-2/FLU/RSV testing.  Fact Sheet for Patients: EntrepreneurPulse.com.au  Fact Sheet for Healthcare Providers: IncredibleEmployment.be  This test is not yet approved or cleared by the [place] FDA and has been authorized for detection and/or diagnosis of SARS-CoV-2 by FDA under an Emergency Use Authorization (EUA). This EUA will remain in effect (meaning this test can be used) for the duration of the COVID-19 declaration under Section 564(b)(1) of the Act, 21 U.S.C. section 360bbb-3(b)(1), unless the authorization is terminated or revoked.  Performed at [facility], [address] [place], [place] [address]   Culture, blood (routine x 2)     Status: None (Preliminary result)   Collection Time: 07/27/22  5:19 PM   Specimen: BLOOD RIGHT HAND  Result Value Ref Range  Status   Specimen Description BLOOD RIGHT  HAND  Final   Special Requests   Final    Normal BOTTLES DRAWN AEROBIC AND ANAEROBIC Blood Culture adequate volume   Culture   Final    NO GROWTH < 24 HOURS Performed at [facility], [address], [place], [place] [address]    Report Status PENDING  Incomplete  Culture, blood (routine x 2)     Status: None (Preliminary result)   Collection Time: 07/27/22  5:19 PM   Specimen: BLOOD LEFT HAND  Result Value Ref Range Status   Specimen Description BLOOD LEFT HAND  Final   Special Requests   Final    Normal BOTTLES DRAWN AEROBIC AND ANAEROBIC Blood Culture adequate volume   Culture   Final    NO GROWTH < 24 HOURS Performed at [facility], [address], [place], [place] [address]    Report Status PENDING  Incomplete     Radiology Studies: MR BRAIN WO CONTRAST  Result Date: 07/27/2022 CLINICAL DATA:  Provided history: Neuro deficit, acute, stroke suspected. EXAM: MRI HEAD WITHOUT CONTRAST MRA HEAD WITHOUT CONTRAST TECHNIQUE: Multiplanar, multi-echo pulse sequences of the brain and surrounding structures were acquired without intravenous contrast. Angiographic images of the Circle of Willis were acquired using MRA technique without intravenous contrast. COMPARISON:  Head CT 07/27/2022. FINDINGS: MRI HEAD FINDINGS Brain: Moderate cerebral atrophy. Advanced multifocal T2 FLAIR hyperintense signal abnormality within the cerebral white matter, nonspecific but compatible chronic small vessel image disease. Minimal chronic small-vessel ischemic changes also present within the pons. 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 There is no acute infarct. No evidence of an intracranial mass. No extra-axial fluid collection. No midline shift. Vascular: Maintained flow voids within the proximal large arterial vessels. Skull and upper cervical spine: No focal suspicious marrow lesion. Incompletely assessed cervical spondylosis. Sinuses/Orbits:  No mass or acute finding within the imaged orbits. No significant paranasal sinus disease. Other: Left mastoid effusion. MRA HEAD FINDING: Anterior circulation: The intracranial internal carotid arteries are patent. The M1 middle cerebral arteries are patent. No M2 proximal branch occlusion or high-grade proximal stenosis. The anterior cerebral arteries are patent. 2 mm broad-based medially projecting vascular protrusion arising from the cavernous left ICA, which may reflect atherosclerotic lobulation or an aneurysm (for instance as seen on series 1035, image 19). Posterior circulation: The intracranial vertebral arteries are patent. Apparent moderate to severe stenosis within the left PCA at the P3/P4 junction (series 1047, image 15). The right PCA is fetal in origin. A left posterior communicating artery is present. Anatomic variants: As described. IMPRESSION: MRI brain: 1. No evidence of acute intracranial abnormality. 2. Chronic small-vessel ischemic changes which are advanced in the cerebral white matter, and minimal in the pons. 3. 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 4. Moderate cerebral atrophy. 5. Left mastoid effusion. MRA head: 1. No intracranial large vessel occlusion is identified. 2. Apparent moderate/severe stenosis within the left PCA at the P3/P4 junction. 3. 2 mm broad-based medially projecting vascular protrusion arising from the cavernous left ICA, which may reflect an aneurysm or atherosclerotic lobulation. Electronically Signed   By: Kellie Simmering D.O.   On: 07/27/2022 15:05   MR ANGIO HEAD WO CONTRAST  Result Date: 07/27/2022 CLINICAL DATA:  Provided history: Neuro deficit, acute, stroke suspected. EXAM: MRI HEAD WITHOUT CONTRAST MRA HEAD WITHOUT CONTRAST TECHNIQUE: Multiplanar, multi-echo pulse sequences of the brain and surrounding structures were acquired without intravenous contrast. Angiographic images of the Circle of Willis were acquired using MRA  technique  without intravenous contrast. COMPARISON:  Head CT 07/27/2022. FINDINGS: MRI HEAD FINDINGS Brain: Moderate cerebral atrophy. Advanced multifocal T2 FLAIR hyperintense signal abnormality within the cerebral white matter, nonspecific but compatible chronic small vessel image disease. Minimal chronic small-vessel ischemic changes also present within the pons. 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 There is no acute infarct. No evidence of an intracranial mass. No extra-axial fluid collection. No midline shift. Vascular: Maintained flow voids within the proximal large arterial vessels. Skull and upper cervical spine: No focal suspicious marrow lesion. Incompletely assessed cervical spondylosis. Sinuses/Orbits: No mass or acute finding within the imaged orbits. No significant paranasal sinus disease. Other: Left mastoid effusion. MRA HEAD FINDING: Anterior circulation: The intracranial internal carotid arteries are patent. The M1 middle cerebral arteries are patent. No M2 proximal branch occlusion or high-grade proximal stenosis. The anterior cerebral arteries are patent. 2 mm broad-based medially projecting vascular protrusion arising from the cavernous left ICA, which may reflect atherosclerotic lobulation or an aneurysm (for instance as seen on series 1035, image 19). Posterior circulation: The intracranial vertebral arteries are patent. Apparent moderate to severe stenosis within the left PCA at the P3/P4 junction (series 1047, image 15). The right PCA is fetal in origin. A left posterior communicating artery is present. Anatomic variants: As described. IMPRESSION: MRI brain: 1. No evidence of acute intracranial abnormality. 2. Chronic small-vessel ischemic changes which are advanced in the cerebral white matter, and minimal in the pons. 3. 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 4. Moderate cerebral atrophy. 5. Left mastoid effusion. MRA head: 1. No  intracranial large vessel occlusion is identified. 2. Apparent moderate/severe stenosis within the left PCA at the P3/P4 junction. 3. 2 mm broad-based medially projecting vascular protrusion arising from the cavernous left ICA, which may reflect an aneurysm or atherosclerotic lobulation. Electronically Signed   By: Kellie Simmering D.O.   On: 07/27/2022 15:05   DG Chest Port 1 View  Result Date: 07/27/2022 CLINICAL DATA:  Code stroke, evaluate for pneumonia EXAM: PORTABLE CHEST 1 VIEW COMPARISON:  06/20/2022 FINDINGS: The heart size and mediastinal contours are within normal limits. Both lungs are clear. The visualized skeletal structures are unremarkable. IMPRESSION: No active disease. Electronically Signed   By: Elmer Picker M.D.   On: 07/27/2022 14:24   CT HEAD CODE STROKE WO CONTRAST  Result Date: 07/27/2022 CLINICAL DATA:  Code stroke. Neuro deficit, acute, stroke suspected. Altered mental status, last known well 9:30 a.m., abnormal gait. EXAM: CT HEAD WITHOUT CONTRAST TECHNIQUE: Contiguous axial images were obtained from the base of the skull through the vertex without intravenous contrast. RADIATION DOSE REDUCTION: This exam was performed according to the departmental dose-optimization program which includes automated exposure control, adjustment of the mA and/or kV according to patient size and/or use of iterative reconstruction technique. COMPARISON:  Head CT 05/29/2020. FINDINGS: Brain: Moderate cerebral atrophy. Advanced patchy and ill-defined hypoattenuation within the cerebral white matter, nonspecific but compatible with chronic small vessel ischemic disease. There is no acute intracranial hemorrhage. No demarcated cortical infarct. No extra-axial fluid collection. No evidence of an intracranial mass. No midline shift. Vascular: No hyperdense vessel. Atherosclerotic calcifications. Skull: No fracture or aggressive osseous lesion. Sinuses/Orbits: No mass or acute finding within the imaged  orbits. No significant paranasal sinus disease at the imaged levels. Other: Left mastoid effusion. ASPECTS [facility] Stroke Program Early CT Score) - Ganglionic level infarction (caudate, lentiform nuclei, internal capsule, insula, M1-M3 cortex): 7 - Supraganglionic infarction (M4-M6 cortex): 3 Total score (  0-10 with 10 being normal): 10 These results were called by telephone at the time of interpretation on 07/27/2022 at 1:12 pm to provider Dr. Sabra Heck, who verbally acknowledged these results. IMPRESSION: No evidence of acute intracranial abnormality. Advanced chronic small vessel ischemic changes within the cerebral white matter. Moderate generalized cerebral atrophy. Left mastoid effusion. Electronically Signed   By: Kellie Simmering D.O.   On: 07/27/2022 13:13     Marzetta Board, MD, PhD [facility]  Between 7 am - 7 pm I am available, please contact me via Amion (for emergencies) or Securechat (non urgent messages)  Between 7 pm - 7 am I am not available, please contact night coverage MD/APP via Amion

## 2022-07-28 NOTE — Progress Notes (Signed)
  Transition of Care [facility]) Screening Note   Patient Details  Name: NEMESIS RAINWATER Date of Birth: [birth year]   Transition of Care [facility]) CM/SW Contact:    Ihor Gully, LCSW Phone Number: 07/28/2022, 11:48 AM    Transition of Care Department [facility]) has reviewed patient and no TOC needs have been identified at this time. We will continue to monitor patient advancement through interdisciplinary progression rounds. If new patient transition needs arise, please place a TOC consult.

## 2022-07-29 DIAGNOSIS — G9341 Metabolic encephalopathy: Secondary | ICD-10-CM | POA: Diagnosis not present

## 2022-07-29 LAB — COMPREHENSIVE METABOLIC PANEL
ALT: 23 U/L (ref 0–44)
AST: 23 U/L (ref 15–41)
Albumin: 2.7 g/dL — ABNORMAL LOW (ref 3.5–5.0)
Alkaline Phosphatase: 50 U/L (ref 38–126)
Anion gap: 8 (ref 5–15)
BUN: 12 mg/dL (ref 8–23)
CO2: 23 mmol/L (ref 22–32)
Calcium: 8.6 mg/dL — ABNORMAL LOW (ref 8.9–10.3)
Chloride: 105 mmol/L (ref 98–111)
Creatinine, Ser: 0.52 mg/dL (ref 0.44–1.00)
GFR, Estimated: >60 mL/min (ref >60)
Glucose, Bld: 92 mg/dL (ref 70–99)
Potassium: 3.6 mmol/L (ref 3.5–5.1)
Sodium: 136 mmol/L (ref 135–145)
Total Bilirubin: 0.9 mg/dL (ref 0.3–1.2)
Total Protein: 6 g/dL — ABNORMAL LOW (ref 6.5–8.1)

## 2022-07-29 LAB — MAGNESIUM: Magnesium: 1.5 mg/dL — ABNORMAL LOW (ref 1.7–2.4)

## 2022-07-29 LAB — CBC
HCT: 37.1 % (ref 36.0–46.0)
Hemoglobin: 12.3 g/dL (ref 12.0–15.0)
MCH: 29.4 pg (ref 26.0–34.0)
MCHC: 33.2 g/dL (ref 30.0–36.0)
MCV: 88.8 fL (ref 80.0–100.0)
Platelets: 153 10*3/uL (ref 150–400)
RBC: 4.18 MIL/uL (ref 3.87–5.11)
RDW: 13.8 % (ref 11.5–15.5)
WBC: 9.6 10*3/uL (ref 4.0–10.5)
nRBC: 0 % (ref 0.0–0.2)

## 2022-07-29 NOTE — Discharge Summary (Signed)
Physician Discharge Summary  Kristine [name]:[id number] DOB: 1940-11-28 DOA: 07/27/2022  PCP: Asencion Noble, MD  Admit date: 07/27/2022  Discharge date: 07/29/2022  Admitted From:Home  Disposition:  Home  Recommendations for Outpatient Follow-up:  Follow up with PCP in 1-2 weeks Continue home medications as prior Patient has completed 3-day course of Rocephin for UTI with no growth on urine or blood cultures  Home Health: None  Equipment/Devices: None  Discharge Condition:Stable  CODE STATUS: Full  Diet recommendation: Heart Healthy  Brief/Interim Summary: Kristine [name] is a 81 y.o. female with medical history significant for kidney transplant, dementia. Patient was brought to the ED via EMS reports of altered mental status.  Per family, she was in her normal state of health in the morning of the day of admission, then the spouse went golfing, when he came back around noon she was slumped over in the chair on the right side, has been incontinent and could not respond to questions.  There are reports of patient having slurred speech.  At baseline, she has some short-term memory deficits but usually is ambulatory without assistance or assistive devices, she is able to answer questions and can hold a conversation.  Patient was admitted for acute metabolic encephalopathy from severe sepsis secondary to UTI in the setting of underlying mild dementia.  She was empirically started on IV Rocephin and initially presented as a code stroke and underwent brain MRI/MRA with no significant findings noted.  She has now completed 3-day course of IV Rocephin and has had no significant growth on her urine or blood cultures and is presenting with stable mentation.  She appears stable for discharge back home with no other needs identified.  No acute events otherwise noted throughout the course of this admission.  Discharge Diagnoses:  Principal Problem:   Acute metabolic encephalopathy Active Problems:    Severe sepsis (HCC)   KIDNEY TRANSPLANTATION   HTN (hypertension)   Pressure injury of skin  Principal discharge diagnosis: Acute metabolic encephalopathy in the setting of underlying mild dementia due to severe sepsis, present on admission secondary to UTI.  Discharge Instructions  Discharge Instructions     Diet - low sodium heart healthy   Complete by: As directed    Increase activity slowly   Complete by: As directed    No wound care   Complete by: As directed       Allergies as of 07/29/2022   No Known Allergies      Medication List     TAKE these medications    amLODipine 2.5 MG tablet Commonly known as: NORVASC Take 2.5 mg by mouth at bedtime.   aspirin EC 81 MG tablet Take 81 mg by mouth daily.   donepezil 10 MG tablet Commonly known as: ARICEPT Take 10 mg by mouth daily.   estradiol 0.1 MG/GM vaginal cream Commonly known as: ESTRACE VAGINAL Use 0.5 gm in vagina 2-3 x weekly   mycophenolate 180 MG EC tablet Commonly known as: MYFORTIC Take 540 mg by mouth 2 (two) times daily.   simvastatin 20 MG tablet Commonly known as: ZOCOR Take 20 mg by mouth at bedtime.   tacrolimus 0.5 MG capsule Commonly known as: PROGRAF Take 0.5 mg by mouth 2 (two) times daily.   Vitamin D 50 MCG (2000 UT) Caps Take 2,000 Units by mouth daily.        Follow-up Information     Asencion Noble, MD. Schedule an appointment as soon as possible for a visit  in 1 week(s).   Specialty: Internal Medicine Contact information: [address] [place] [address] [phone]                No Known Allergies  Consultations: None   Procedures/Studies: MR BRAIN WO CONTRAST  Result Date: 07/27/2022 CLINICAL DATA:  Provided history: Neuro deficit, acute, stroke suspected. EXAM: MRI HEAD WITHOUT CONTRAST MRA HEAD WITHOUT CONTRAST TECHNIQUE: Multiplanar, multi-echo pulse sequences of the brain and surrounding structures were acquired without intravenous  contrast. Angiographic images of the Circle of Willis were acquired using MRA technique without intravenous contrast. COMPARISON:  Head CT 07/27/2022. FINDINGS: MRI HEAD FINDINGS Brain: Moderate cerebral atrophy. Advanced multifocal T2 FLAIR hyperintense signal abnormality within the cerebral white matter, nonspecific but compatible chronic small vessel image disease. Minimal chronic small-vessel ischemic changes also present within the pons. 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 There is no acute infarct. No evidence of an intracranial mass. No extra-axial fluid collection. No midline shift. Vascular: Maintained flow voids within the proximal large arterial vessels. Skull and upper cervical spine: No focal suspicious marrow lesion. Incompletely assessed cervical spondylosis. Sinuses/Orbits: No mass or acute finding within the imaged orbits. No significant paranasal sinus disease. Other: Left mastoid effusion. MRA HEAD FINDING: Anterior circulation: The intracranial internal carotid arteries are patent. The M1 middle cerebral arteries are patent. No M2 proximal branch occlusion or high-grade proximal stenosis. The anterior cerebral arteries are patent. 2 mm broad-based medially projecting vascular protrusion arising from the cavernous left ICA, which may reflect atherosclerotic lobulation or an aneurysm (for instance as seen on series 1035, image 19). Posterior circulation: The intracranial vertebral arteries are patent. Apparent moderate to severe stenosis within the left PCA at the P3/P4 junction (series 1047, image 15). The right PCA is fetal in origin. A left posterior communicating artery is present. Anatomic variants: As described. IMPRESSION: MRI brain: 1. No evidence of acute intracranial abnormality. 2. Chronic small-vessel ischemic changes which are advanced in the cerebral white matter, and minimal in the pons. 3. Small chronic lacunar infarcts within the bilateral basal  ganglia. 4. Moderate cerebral atrophy. 5. Left mastoid effusion. MRA head: 1. No intracranial large vessel occlusion is identified. 2. Apparent moderate/severe stenosis within the left PCA at the P3/P4 junction. 3. 2 mm broad-based medially projecting vascular protrusion arising from the cavernous left ICA, which may reflect an aneurysm or atherosclerotic lobulation. Electronically Signed   By: Kellie Simmering D.O.   On: 07/27/2022 15:05   MR ANGIO HEAD WO CONTRAST  Result Date: 07/27/2022 CLINICAL DATA:  Provided history: Neuro deficit, acute, stroke suspected. EXAM: MRI HEAD WITHOUT CONTRAST MRA HEAD WITHOUT CONTRAST TECHNIQUE: Multiplanar, multi-echo pulse sequences of the brain and surrounding structures were acquired without intravenous contrast. Angiographic images of the Circle of Willis were acquired using MRA technique without intravenous contrast. COMPARISON:  Head CT 07/27/2022. FINDINGS: MRI HEAD FINDINGS Brain: Moderate cerebral atrophy. Advanced multifocal T2 FLAIR hyperintense signal abnormality within the cerebral white matter, nonspecific but compatible chronic small vessel image disease. Minimal chronic small-vessel ischemic changes also present within the pons. 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 There is no acute infarct. No evidence of an intracranial mass. No extra-axial fluid collection. No midline shift. Vascular: Maintained flow voids within the proximal large arterial vessels. Skull and upper cervical spine: No focal suspicious marrow lesion. Incompletely assessed cervical spondylosis. Sinuses/Orbits: No mass or acute finding within the imaged orbits. No significant paranasal sinus disease. Other: Left mastoid effusion.  MRA HEAD FINDING: Anterior circulation: The intracranial internal carotid arteries are patent. The M1 middle cerebral arteries are patent. No M2 proximal branch occlusion or high-grade proximal stenosis. The anterior cerebral arteries are  patent. 2 mm broad-based medially projecting vascular protrusion arising from the cavernous left ICA, which may reflect atherosclerotic lobulation or an aneurysm (for instance as seen on series 1035, image 19). Posterior circulation: The intracranial vertebral arteries are patent. Apparent moderate to severe stenosis within the left PCA at the P3/P4 junction (series 1047, image 15). The right PCA is fetal in origin. A left posterior communicating artery is present. Anatomic variants: As described. IMPRESSION: MRI brain: 1. No evidence of acute intracranial abnormality. 2. Chronic small-vessel ischemic changes which are advanced in the cerebral white matter, and minimal in the pons. 3. 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 4. Moderate cerebral atrophy. 5. Left mastoid effusion. MRA head: 1. No intracranial large vessel occlusion is identified. 2. Apparent moderate/severe stenosis within the left PCA at the P3/P4 junction. 3. 2 mm broad-based medially projecting vascular protrusion arising from the cavernous left ICA, which may reflect an aneurysm or atherosclerotic lobulation. Electronically Signed   By: Kellie Simmering D.O.   On: 07/27/2022 15:05   DG Chest Port 1 View  Result Date: 07/27/2022 CLINICAL DATA:  Code stroke, evaluate for pneumonia EXAM: PORTABLE CHEST 1 VIEW COMPARISON:  06/20/2022 FINDINGS: The heart size and mediastinal contours are within normal limits. Both lungs are clear. The visualized skeletal structures are unremarkable. IMPRESSION: No active disease. Electronically Signed   By: Elmer Picker M.D.   On: 07/27/2022 14:24   CT HEAD CODE STROKE WO CONTRAST  Result Date: 07/27/2022 CLINICAL DATA:  Code stroke. Neuro deficit, acute, stroke suspected. Altered mental status, last known well 9:30 a.m., abnormal gait. EXAM: CT HEAD WITHOUT CONTRAST TECHNIQUE: Contiguous axial images were obtained from the base of the skull through the vertex without intravenous  contrast. RADIATION DOSE REDUCTION: This exam was performed according to the departmental dose-optimization program which includes automated exposure control, adjustment of the mA and/or kV according to patient size and/or use of iterative reconstruction technique. COMPARISON:  Head CT 05/29/2020. FINDINGS: Brain: Moderate cerebral atrophy. Advanced patchy and ill-defined hypoattenuation within the cerebral white matter, nonspecific but compatible with chronic small vessel ischemic disease. There is no acute intracranial hemorrhage. No demarcated cortical infarct. No extra-axial fluid collection. No evidence of an intracranial mass. No midline shift. Vascular: No hyperdense vessel. Atherosclerotic calcifications. Skull: No fracture or aggressive osseous lesion. Sinuses/Orbits: No mass or acute finding within the imaged orbits. No significant paranasal sinus disease at the imaged levels. Other: Left mastoid effusion. ASPECTS ([place] Stroke Program Early CT Score) - Ganglionic level infarction (caudate, lentiform nuclei, internal capsule, insula, M1-M3 cortex): 7 - Supraganglionic infarction (M4-M6 cortex): 3 Total score (0-10 with 10 being normal): 10 These results were called by telephone at the time of interpretation on 07/27/2022 at 1:12 pm to provider Dr. Sabra Heck, who verbally acknowledged these results. IMPRESSION: No evidence of acute intracranial abnormality. Advanced chronic small vessel ischemic changes within the cerebral white matter. Moderate generalized cerebral atrophy. Left mastoid effusion. Electronically Signed   By: Kellie Simmering D.O.   On: 07/27/2022 13:13     Discharge Exam: Vitals:   07/28/22 1949 07/29/22 0320  BP: 131/69 (!) 143/66  Pulse: 63 66  Resp: 20 16  Temp: 98.2 F (36.8 C) 97.9 F (36.6 C)  SpO2: 97% 95%   Vitals:   07/28/22  0440 07/28/22 0700 07/28/22 1949 07/29/22 0320  BP: 133/70 125/61 131/69 (!) 143/66  Pulse: 61 62 63 66  Resp: '16 16 20 16  '$ Temp: 98.6 F (37 C)  98.7 F (37.1 C) 98.2 F (36.8 C) 97.9 F (36.6 C)  TempSrc: Oral Oral    SpO2: 99% 98% 97% 95%  Weight:      Height:        General: Pt is alert, awake, not in acute distress Cardiovascular: RRR, S1/S2 +, no rubs, no gallops Respiratory: CTA bilaterally, no wheezing, no rhonchi Abdominal: Soft, NT, ND, bowel sounds + Extremities: no edema, no cyanosis    The results of significant diagnostics from this hospitalization (including imaging, microbiology, ancillary and laboratory) are listed below for reference.     Microbiology: Recent Results (from the past 240 hour(s))  Resp Panel by RT-PCR (Flu A&B, Covid) Anterior Nasal Swab     Status: None   Collection Time: 07/27/22  1:34 PM   Specimen: Anterior Nasal Swab  Result Value Ref Range Status   SARS Coronavirus 2 by RT PCR NEGATIVE NEGATIVE Final    Comment: (NOTE) SARS-CoV-2 target nucleic acids are NOT DETECTED.  The SARS-CoV-2 RNA is generally detectable in upper respiratory specimens during the acute phase of infection. The lowest concentration of SARS-CoV-2 viral copies this assay can detect is 138 copies/mL. A negative result does not preclude SARS-Cov-2 infection and should not be used as the sole basis for treatment or other patient management decisions. A negative result may occur with  improper specimen collection/handling, submission of specimen other than nasopharyngeal swab, presence of viral mutation(s) within the areas targeted by this assay, and inadequate number of viral copies(<138 copies/mL). A negative result must be combined with clinical observations, patient history, and epidemiological information. The expected result is Negative.  Fact Sheet for Patients:  EntrepreneurPulse.com.au  Fact Sheet for Healthcare Providers:  IncredibleEmployment.be  This test is no t yet approved or cleared by the [place] FDA and  has been authorized for detection and/or  diagnosis of SARS-CoV-2 by FDA under an Emergency Use Authorization (EUA). This EUA will remain  in effect (meaning this test can be used) for the duration of the COVID-19 declaration under Section 564(b)(1) of the Act, 21 U.S.C.section 360bbb-3(b)(1), unless the authorization is terminated  or revoked sooner.       Influenza A by PCR NEGATIVE NEGATIVE Final   Influenza B by PCR NEGATIVE NEGATIVE Final    Comment: (NOTE) The Xpert Xpress SARS-CoV-2/FLU/RSV plus assay is intended as an aid in the diagnosis of influenza from Nasopharyngeal swab specimens and should not be used as a sole basis for treatment. Nasal washings and aspirates are unacceptable for Xpert Xpress SARS-CoV-2/FLU/RSV testing.  Fact Sheet for Patients: EntrepreneurPulse.com.au  Fact Sheet for Healthcare Providers: IncredibleEmployment.be  This test is not yet approved or cleared by the [place] FDA and has been authorized for detection and/or diagnosis of SARS-CoV-2 by FDA under an Emergency Use Authorization (EUA). This EUA will remain in effect (meaning this test can be used) for the duration of the COVID-19 declaration under Section 564(b)(1) of the Act, 21 U.S.C. section 360bbb-3(b)(1), unless the authorization is terminated or revoked.  Performed at [facility], [address] [place], [place] [address]   Urine Culture     Status: None   Collection Time: 07/27/22  2:16 PM   Specimen: Urine, Clean Catch  Result Value Ref Range Status   Specimen Description   Final  URINE, CLEAN CATCH Performed at [facility], [address] [place], [place] [address]    Special Requests   Final    NONE Performed at [facility], [address] [place], [place] [address]    Culture   Final    NO GROWTH Performed at [facility], [place] [address], [place], [place] [address]    Report Status 07/28/2022 FINAL  Final  Culture, blood (routine x 2)     Status: None  (Preliminary result)   Collection Time: 07/27/22  5:19 PM   Specimen: BLOOD RIGHT HAND  Result Value Ref Range Status   Specimen Description BLOOD RIGHT HAND  Final   Special Requests   Final    Normal BOTTLES DRAWN AEROBIC AND ANAEROBIC Blood Culture adequate volume   Culture   Final    NO GROWTH 2 DAYS Performed at [facility], [address] [place], [place] [address]    Report Status PENDING  Incomplete  Culture, blood (routine x 2)     Status: None (Preliminary result)   Collection Time: 07/27/22  5:19 PM   Specimen: BLOOD LEFT HAND  Result Value Ref Range Status   Specimen Description BLOOD LEFT HAND  Final   Special Requests   Final    Normal BOTTLES DRAWN AEROBIC AND ANAEROBIC Blood Culture adequate volume   Culture   Final    NO GROWTH 2 DAYS Performed at [facility], [address] [place],  [address]    Report Status PENDING  Incomplete     Labs: BNP (last 3 results) No results for input(s): "BNP" in the last 8760 hours. Basic Metabolic Panel: Recent Labs  Lab 07/27/22 1300 07/27/22 1320 07/28/22 0314 07/29/22 0629  NA 132* 131* 135 136  K 4.8 4.1 4.2 3.6  CL 98 99 104 105  CO2  --  '24 22 23  '$ GLUCOSE 144* 145* 94 92  BUN '16 13 13 12  '$ CREATININE 0.60 0.73 0.70 0.52  CALCIUM  --  8.8* 8.5* 8.6*  MG  --   --   --  1.5*   Liver Function Tests: Recent Labs  Lab 07/27/22 1320 07/29/22 0629  AST 26 23  ALT 22 23  ALKPHOS 65 50  BILITOT 1.2 0.9  PROT 7.3 6.0*  ALBUMIN 3.6 2.7*   No results for input(s): "LIPASE", "AMYLASE" in the last 168 hours. Recent Labs  Lab 07/27/22 1719  AMMONIA 24   CBC: Recent Labs  Lab 07/27/22 1300 07/27/22 1320 07/28/22 0314 07/29/22 0629  WBC  --  15.3* 14.1* 9.6  NEUTROABS  --  14.0*  --   --   HGB 15.0 13.4 12.3 12.3  HCT 44.0 41.6 38.9 37.1  MCV  --  88.9 90.9 88.8  PLT  --  131* 129* 153   Cardiac Enzymes: No results for input(s): "CKTOTAL", "CKMB", "CKMBINDEX", "TROPONINI" in the last 168  hours. BNP: Invalid input(s): "POCBNP" CBG: Recent Labs  Lab 07/27/22 1253  GLUCAP 137*   D-Dimer No results for input(s): "DDIMER" in the last 72 hours. Hgb A1c No results for input(s): "HGBA1C" in the last 72 hours. Lipid Profile No results for input(s): "CHOL", "HDL", "LDLCALC", "TRIG", "CHOLHDL", "LDLDIRECT" in the last 72 hours. Thyroid function studies No results for input(s): "TSH", "T4TOTAL", "T3FREE", "THYROIDAB" in the last 72 hours.  Invalid input(s): "FREET3" Anemia work up No results for input(s): "VITAMINB12", "FOLATE", "FERRITIN", "TIBC", "IRON", "RETICCTPCT" in the last 72 hours. Urinalysis    Component Value  Date/Time   COLORURINE YELLOW 07/27/2022 [place] 07/27/2022 1416   LABSPEC 1.012 07/27/2022 1416   PHURINE 5.0 07/27/2022 1416   GLUCOSEU NEGATIVE 07/27/2022 1416   HGBUR MODERATE (A) 07/27/2022 1416   BILIRUBINUR NEGATIVE 07/27/2022 1416   BILIRUBINUR negative 02/28/2021 0954   KETONESUR NEGATIVE 07/27/2022 1416   PROTEINUR 30 (A) 07/27/2022 1416   UROBILINOGEN 1.0 02/28/2021 0954   NITRITE POSITIVE (A) 07/27/2022 1416   LEUKOCYTESUR SMALL (A) 07/27/2022 1416   Sepsis Labs Recent Labs  Lab 07/27/22 1320 07/28/22 0314 07/29/22 0629  WBC 15.3* 14.1* 9.6   Microbiology Recent Results (from the past 240 hour(s))  Resp Panel by RT-PCR (Flu A&B, Covid) Anterior Nasal Swab     Status: None   Collection Time: 07/27/22  1:34 PM   Specimen: Anterior Nasal Swab  Result Value Ref Range Status   SARS Coronavirus 2 by RT PCR NEGATIVE NEGATIVE Final    Comment: (NOTE) SARS-CoV-2 target nucleic acids are NOT DETECTED.  The SARS-CoV-2 RNA is generally detectable in upper respiratory specimens during the acute phase of infection. The lowest concentration of SARS-CoV-2 viral copies this assay can detect is 138 copies/mL. A negative result does not preclude SARS-Cov-2 infection and should not be used as the sole basis for treatment  or other patient management decisions. A negative result may occur with  improper specimen collection/handling, submission of specimen other than nasopharyngeal swab, presence of viral mutation(s) within the areas targeted by this assay, and inadequate number of viral copies(<138 copies/mL). A negative result must be combined with clinical observations, patient history, and epidemiological information. The expected result is Negative.  Fact Sheet for Patients:  EntrepreneurPulse.com.au  Fact Sheet for Healthcare Providers:  IncredibleEmployment.be  This test is no t yet approved or cleared by the [place] FDA and  has been authorized for detection and/or diagnosis of SARS-CoV-2 by FDA under an Emergency Use Authorization (EUA). This EUA will remain  in effect (meaning this test can be used) for the duration of the COVID-19 declaration under Section 564(b)(1) of the Act, 21 U.S.C.section 360bbb-3(b)(1), unless the authorization is terminated  or revoked sooner.       Influenza A by PCR NEGATIVE NEGATIVE Final   Influenza B by PCR NEGATIVE NEGATIVE Final    Comment: (NOTE) The Xpert Xpress SARS-CoV-2/FLU/RSV plus assay is intended as an aid in the diagnosis of influenza from Nasopharyngeal swab specimens and should not be used as a sole basis for treatment. Nasal washings and aspirates are unacceptable for Xpert Xpress SARS-CoV-2/FLU/RSV testing.  Fact Sheet for Patients: EntrepreneurPulse.com.au  Fact Sheet for Healthcare Providers: IncredibleEmployment.be  This test is not yet approved or cleared by the [place] FDA and has been authorized for detection and/or diagnosis of SARS-CoV-2 by FDA under an Emergency Use Authorization (EUA). This EUA will remain in effect (meaning this test can be used) for the duration of the COVID-19 declaration under Section 564(b)(1) of the Act, 21 U.S.C. section  360bbb-3(b)(1), unless the authorization is terminated or revoked.  Performed at [facility], [address], [place], [place] [address]   Urine Culture     Status: None   Collection Time: 07/27/22  2:16 PM   Specimen: Urine, Clean Catch  Result Value Ref Range Status   Specimen Description   Final    URINE, CLEAN CATCH Performed at [facility], [address], [place], [place] [address]    Special Requests   Final    NONE Performed at  [address] [place], [place] [address]    Culture   Final    NO GROWTH Performed at [facility], [place] [address] [place], [place] [address]    Report Status 07/28/2022 FINAL  Final  Culture, blood (routine x 2)     Status: None (Preliminary result)   Collection Time: 07/27/22  5:19 PM   Specimen: BLOOD RIGHT HAND  Result Value Ref Range Status   Specimen Description BLOOD RIGHT HAND  Final   Special Requests   Final    Normal BOTTLES DRAWN AEROBIC AND ANAEROBIC Blood Culture adequate volume   Culture   Final    NO GROWTH 2 DAYS Performed at [facility], [address] [place], [place] [address]    Report Status PENDING  Incomplete  Culture, blood (routine x 2)     Status: None (Preliminary result)   Collection Time: 07/27/22  5:19 PM   Specimen: BLOOD LEFT HAND  Result Value Ref Range Status   Specimen Description BLOOD LEFT HAND  Final   Special Requests   Final    Normal BOTTLES DRAWN AEROBIC AND ANAEROBIC Blood Culture adequate volume   Culture   Final    NO GROWTH 2 DAYS Performed at [facility], [address], [place], [place] [address]    Report Status PENDING  Incomplete     Time coordinating discharge: 35 minutes  SIGNED:   Rodena Goldmann, DO [facility] 07/29/2022, 9:21 AM  If 7PM-7AM, please contact night-coverage [email or web address]

## 2022-08-01 DIAGNOSIS — N390 Urinary tract infection, site not specified: Secondary | ICD-10-CM | POA: Diagnosis not present

## 2022-08-01 DIAGNOSIS — R4182 Altered mental status, unspecified: Secondary | ICD-10-CM | POA: Diagnosis not present

## 2022-08-01 DIAGNOSIS — A419 Sepsis, unspecified organism: Secondary | ICD-10-CM | POA: Diagnosis not present

## 2022-08-01 LAB — CULTURE, BLOOD (ROUTINE X 2)
Culture: NO GROWTH
Culture: NO GROWTH

## 2022-08-04 DIAGNOSIS — Z940 Kidney transplant status: Secondary | ICD-10-CM | POA: Diagnosis not present

## 2022-08-08 DIAGNOSIS — F0150 Vascular dementia without behavioral disturbance: Secondary | ICD-10-CM | POA: Diagnosis not present

## 2022-08-08 DIAGNOSIS — F0390 Unspecified dementia without behavioral disturbance: Secondary | ICD-10-CM | POA: Diagnosis not present

## 2022-08-08 DIAGNOSIS — R630 Anorexia: Secondary | ICD-10-CM | POA: Diagnosis not present

## 2022-08-12 ENCOUNTER — Ambulatory Visit
Admission: EM | Admit: 2022-08-12 | Discharge: 2022-08-12 | Disposition: A | Discharge status: Home / Self Care | Payer: PPO | Attending: Family Medicine | Admitting: Family Medicine

## 2022-08-12 ENCOUNTER — Encounter: Payer: Self-pay | Admitting: Emergency Medicine

## 2022-08-12 ENCOUNTER — Other Ambulatory Visit: Payer: Self-pay

## 2022-08-12 DIAGNOSIS — N390 Urinary tract infection, site not specified: Secondary | ICD-10-CM | POA: Insufficient documentation

## 2022-08-12 LAB — POCT URINALYSIS DIP (MANUAL ENTRY)
Bilirubin, UA: NEGATIVE
Glucose, UA: NEGATIVE mg/dL
Ketones, POC UA: NEGATIVE mg/dL
Nitrite, UA: NEGATIVE
Spec Grav, UA: 1.025 (ref 1.010–1.025)
Urobilinogen, UA: 1 E.U./dL
pH, UA: 5.5 (ref 5.0–8.0)

## 2022-08-12 MED ORDER — CEPHALEXIN 500 MG PO CAPS: 500.0000 mg | ORAL_CAPSULE | Freq: Two times a day (BID) | ORAL | 0 refills | Status: DC

## 2022-08-12 NOTE — ED Triage Notes (Signed)
Pt reports woke up with urinary frequency and dysuria. Pt reports hospitalized for similar x2 weeks ago and reports "just wants to make sure it hasn't come back."

## 2022-08-12 NOTE — ED Provider Notes (Signed)
[facility]    CSN: [id number] Arrival date & time: 08/12/22  1000      History   Chief Complaint Chief Complaint  Patient presents with   Urinary Frequency    HPI Kristine [name] is a 81 y.o. female.   Presenting today with dysuria, urinary frequency that started this morning upon waking.  Denies fever, altered mental status, abdominal pain, nausea, vomiting, hematuria, flank pain, vaginal symptoms.  Was admitted to the hospital 2 weeks ago for severe sepsis from a UTI and states symptoms fully resolved after discharge up until today.  Not trying anything over-the-counter for symptoms thus far.    Past Medical History:  Diagnosis Date   Chronic kidney disease    had kidney transplant   Vascular dementia without behavioral disturbance [facility])     Patient Active Problem List   Diagnosis Date Noted   Pressure injury of skin 41/28/7867   Acute metabolic encephalopathy 67/20/9470   Severe sepsis ([place]) 07/27/2022   HTN (hypertension) 07/27/2022   Encounter for screening fecal occult blood testing 07/11/2022   Encounter for well woman exam with routine gynecological exam 07/11/2022   Loss of weight 09/05/2021   Generalized abdominal pain 09/05/2021   Vaginal atrophy 03/22/2021   Symptoms of urinary tract infection 03/22/2021   History of kidney transplant 03/22/2021   Endometrial hyperplasia without atypia 01/19/2021   PMB (postmenopausal bleeding)    Abnormal endometrial ultrasound    Cervical stenosis (uterine cervix)    Tubular adenoma 07/22/2012   DIARRHEA 12/27/2009   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    Past Surgical History:  Procedure Laterality Date   CHOLECYSTECTOMY     COLONOSCOPY  08/28/2012   Procedure: COLONOSCOPY;  Surgeon: Rogene Houston, MD;  [facility];  Service: Endoscopy;  Laterality: N/A;  930   COLONOSCOPY WITH PROPOFOL N/A 07/14/2020   Procedure: COLONOSCOPY WITH PROPOFOL;  Surgeon: Rogene Houston, MD;  Location: [facility];  Service: Endoscopy;  Laterality: N/A;  930   HYSTEROSCOPY WITH D & C N/A 01/11/2021   Procedure: DILATATION AND CURETTAGE /HYSTEROSCOPY;  Surgeon: [name], MD;  Location: [facility] ORS;  Service: Gynecology;  Laterality: N/A;   KIDNEY TRANSPLANT     orif left wrist     TONSILLECTOMY      OB History     Gravida  2   Para  2   Term  2   Preterm      AB      Living  2      SAB      IAB      Ectopic      Multiple      Live Births  2            Home Medications    Prior to Admission medications   Medication Sig Start Date End Date Taking? Authorizing Provider  cephALEXin (KEFLEX) 500 MG capsule Take 1 capsule (500 mg total) by mouth 2 (two) times daily. 08/12/22  Yes Volney American, PA-C  amLODipine (NORVASC) 2.5 MG tablet Take 2.5 mg by mouth at bedtime.  03/02/20   [provider]  aspirin EC 81 MG tablet Take 81 mg by mouth daily.    [provider]  Cholecalciferol (VITAMIN D) 2000 UNITS CAPS Take 2,000 Units by mouth daily.     [provider]  donepezil (ARICEPT) 10 MG tablet Take 10 mg by mouth daily. 07/17/22   [provider]  estradiol (  ESTRACE VAGINAL) 0.1 MG/GM vaginal cream Use 0.5 gm in vagina 2-3 x weekly 10/20/21   Derrek Monaco A, NP  mycophenolate (MYFORTIC) 180 MG EC tablet Take 540 mg by mouth 2 (two) times daily.    [provider]  simvastatin (ZOCOR) 20 MG tablet Take 20 mg by mouth at bedtime.     [provider]  tacrolimus (PROGRAF) 0.5 MG capsule Take 0.5 mg by mouth 2 (two) times daily.     [provider]    Family History History reviewed. No pertinent family history.  Social History Social History   Tobacco Use   Smoking status: Former   Smokeless tobacco: Never   Tobacco comments:    quit in 1977  Vaping Use   Vaping Use: Never used  Substance Use Topics   Alcohol use: No   Drug use: No     Allergies   Patient has no known  allergies.   Review of Systems Review of Systems Per HPI  Physical Exam Triage Vital Signs ED Triage Vitals [08/12/22 1049]  Enc Vitals Group     BP (!) 148/79     Pulse Rate 69     Resp 20     Temp 98.4 F (36.9 C)     Temp Source Oral     SpO2 96 %     Weight      Height      Head Circumference      Peak Flow      Pain Score 0     Pain Loc      Pain Edu?      Excl. in [place]?    No data found.  Updated Vital Signs BP (!) 148/79 (BP Location: Right Arm)   Pulse 69   Temp 98.4 F (36.9 C) (Oral)   Resp 20   SpO2 96%   Visual Acuity Right Eye Distance:   Left Eye Distance:   Bilateral Distance:    Right Eye Near:   Left Eye Near:    Bilateral Near:     Physical Exam Vitals and nursing note reviewed.  Constitutional:      Appearance: Normal appearance. She is not ill-appearing.  HENT:     Head: Atraumatic.  Eyes:     Extraocular Movements: Extraocular movements intact.     Conjunctiva/sclera: Conjunctivae normal.  Cardiovascular:     Rate and Rhythm: Normal rate and regular rhythm.     Heart sounds: Normal heart sounds.  Pulmonary:     Effort: Pulmonary effort is normal.     Breath sounds: Normal breath sounds.  Abdominal:     General: Bowel sounds are normal. There is no distension.     Palpations: Abdomen is soft.     Tenderness: There is no abdominal tenderness. There is no right CVA tenderness, left CVA tenderness or guarding.  Musculoskeletal:        General: Normal range of motion.     Cervical back: Normal range of motion and neck supple.  Skin:    General: Skin is warm and dry.  Neurological:     Mental Status: She is alert and oriented to person, place, and time.  Psychiatric:        Mood and Affect: Mood normal.        Thought Content: Thought content normal.        Judgment: Judgment normal.      UC Treatments / Results  Labs (all labs ordered are listed, but only  abnormal results are displayed) Labs Reviewed  POCT URINALYSIS  DIP (MANUAL ENTRY) - Abnormal; Notable for the following components:      Result Value   Clarity, UA cloudy (*)    Blood, UA moderate (*)    Protein Ur, POC trace (*)    Leukocytes, UA Moderate (2+) (*)    All other components within normal limits  URINE CULTURE    EKG   Radiology No results found.  Procedures Procedures (including critical care time)  Medications Ordered in UC Medications - No data to display  Initial Impression / Assessment and Plan / UC Course  I have reviewed the triage vital signs and the nursing notes.  Pertinent labs & imaging results that were available during my care of the patient were reviewed by me and considered in my medical decision making (see chart for details).     Urinalysis suspicious for urinary tract infection today, will treat with Keflex while awaiting urine culture results.  Discussed to increase fluid intake, empty bladder fully each time and follow-up with PCP next week for recheck.  Return for worsening symptoms at any time.  Final Clinical Impressions(s) / UC Diagnoses   Final diagnoses:  Acute lower UTI   Discharge Instructions   None    ED Prescriptions     Medication Sig Dispense Auth. Provider   cephALEXin (KEFLEX) 500 MG capsule Take 1 capsule (500 mg total) by mouth 2 (two) times daily. 10 capsule Volney American, [place]      PDMP not reviewed this encounter.   Volney American, [place] 08/12/22 1156

## 2022-08-14 LAB — URINE CULTURE: Culture: 30000 — AB

## 2022-08-17 ENCOUNTER — Encounter: Payer: Self-pay | Admitting: *Deleted

## 2022-08-17 ENCOUNTER — Telehealth: Payer: Self-pay | Admitting: *Deleted

## 2022-08-17 NOTE — Patient Outreach (Signed)
  Care Coordination   Initial Visit Note   08/17/2022 Name: Kristine [name] MRN: [id number] DOB: [birth year]  Kristine [name] is a 81 y.o. year old female who sees Asencion Noble, MD for primary care. I spoke with  Donnie Aho by phone today.  What matters to the patients health and wellness today?  Ongoing self management of chronic medical conditions    Goals Addressed             This Visit's Progress    COMPLETED: Care [facility] (no follow-up needed)       Care Coordination Interventions: Reviewed medications with patient and discussed affordability Assessed social determinant of health barriers Assessed mobility and ability to perform ADLs Assessed family/Social support Provided patient/caregiver with verbal information on [place] [phone]) Encouraged patient to request a referral for [place] from PCP if services are needed in the future         SDOH assessments and interventions completed:  Yes  SDOH Interventions Today    Flowsheet Row Most Recent Value  SDOH Interventions   Housing Interventions Intervention Not Indicated  Transportation Interventions Intervention Not Indicated  Financial Strain Interventions Intervention Not Indicated        Care Coordination Interventions Activated:  Yes  Care Coordination Interventions:  Yes, provided   Follow up plan: No further intervention required.   Encounter Outcome:  Pt. Visit Completed   Chong Sicilian, BSN, RN-BC RN Care Coordinator [place] Direct Dial: [phone] Main #: [phone]

## 2022-09-08 DIAGNOSIS — Z940 Kidney transplant status: Secondary | ICD-10-CM | POA: Diagnosis not present

## 2022-09-26 DIAGNOSIS — F0150 Vascular dementia without behavioral disturbance: Secondary | ICD-10-CM | POA: Diagnosis not present

## 2022-09-26 DIAGNOSIS — I10 Essential (primary) hypertension: Secondary | ICD-10-CM | POA: Diagnosis not present

## 2022-10-23 HISTORY — PX: BRAIN SURGERY: SHX531

## 2022-10-30 DIAGNOSIS — L820 Inflamed seborrheic keratosis: Secondary | ICD-10-CM | POA: Diagnosis not present

## 2022-10-30 DIAGNOSIS — C44321 Squamous cell carcinoma of skin of nose: Secondary | ICD-10-CM | POA: Diagnosis not present

## 2022-10-30 DIAGNOSIS — D225 Melanocytic nevi of trunk: Secondary | ICD-10-CM | POA: Diagnosis not present

## 2022-10-30 DIAGNOSIS — C44722 Squamous cell carcinoma of skin of right lower limb, including hip: Secondary | ICD-10-CM | POA: Diagnosis not present

## 2022-10-30 DIAGNOSIS — Z85828 Personal history of other malignant neoplasm of skin: Secondary | ICD-10-CM | POA: Diagnosis not present

## 2022-10-30 DIAGNOSIS — R208 Other disturbances of skin sensation: Secondary | ICD-10-CM | POA: Diagnosis not present

## 2022-10-30 DIAGNOSIS — Z08 Encounter for follow-up examination after completed treatment for malignant neoplasm: Secondary | ICD-10-CM | POA: Diagnosis not present

## 2022-10-30 DIAGNOSIS — L570 Actinic keratosis: Secondary | ICD-10-CM | POA: Diagnosis not present

## 2022-10-30 DIAGNOSIS — L538 Other specified erythematous conditions: Secondary | ICD-10-CM | POA: Diagnosis not present

## 2022-10-30 DIAGNOSIS — L298 Other pruritus: Secondary | ICD-10-CM | POA: Diagnosis not present

## 2022-10-30 DIAGNOSIS — D485 Neoplasm of uncertain behavior of skin: Secondary | ICD-10-CM | POA: Diagnosis not present

## 2022-10-30 DIAGNOSIS — Z789 Other specified health status: Secondary | ICD-10-CM | POA: Diagnosis not present

## 2022-10-30 DIAGNOSIS — R229 Localized swelling, mass and lump, unspecified: Secondary | ICD-10-CM | POA: Diagnosis not present

## 2022-11-01 DIAGNOSIS — Z4822 Encounter for aftercare following kidney transplant: Secondary | ICD-10-CM | POA: Diagnosis not present

## 2022-11-01 DIAGNOSIS — D849 Immunodeficiency, unspecified: Secondary | ICD-10-CM | POA: Diagnosis not present

## 2022-11-01 DIAGNOSIS — Z940 Kidney transplant status: Secondary | ICD-10-CM | POA: Diagnosis not present

## 2022-11-01 DIAGNOSIS — N281 Cyst of kidney, acquired: Secondary | ICD-10-CM | POA: Diagnosis not present

## 2022-11-01 DIAGNOSIS — R630 Anorexia: Secondary | ICD-10-CM | POA: Diagnosis not present

## 2022-11-01 DIAGNOSIS — E782 Mixed hyperlipidemia: Secondary | ICD-10-CM | POA: Diagnosis not present

## 2022-11-01 DIAGNOSIS — R197 Diarrhea, unspecified: Secondary | ICD-10-CM | POA: Diagnosis not present

## 2022-11-01 DIAGNOSIS — I10 Essential (primary) hypertension: Secondary | ICD-10-CM | POA: Diagnosis not present

## 2022-11-01 DIAGNOSIS — R413 Other amnesia: Secondary | ICD-10-CM | POA: Diagnosis not present

## 2022-11-01 DIAGNOSIS — Z48298 Encounter for aftercare following other organ transplant: Secondary | ICD-10-CM | POA: Diagnosis not present

## 2022-11-01 DIAGNOSIS — N950 Postmenopausal bleeding: Secondary | ICD-10-CM | POA: Diagnosis not present

## 2022-11-01 DIAGNOSIS — E785 Hyperlipidemia, unspecified: Secondary | ICD-10-CM | POA: Diagnosis not present

## 2022-11-01 DIAGNOSIS — Z7982 Long term (current) use of aspirin: Secondary | ICD-10-CM | POA: Diagnosis not present

## 2022-11-13 DIAGNOSIS — L570 Actinic keratosis: Secondary | ICD-10-CM | POA: Diagnosis not present

## 2022-11-13 DIAGNOSIS — L578 Other skin changes due to chronic exposure to nonionizing radiation: Secondary | ICD-10-CM | POA: Diagnosis not present

## 2022-11-21 DIAGNOSIS — Z940 Kidney transplant status: Secondary | ICD-10-CM | POA: Diagnosis not present

## 2022-11-27 DIAGNOSIS — D0439 Carcinoma in situ of skin of other parts of face: Secondary | ICD-10-CM | POA: Diagnosis not present

## 2022-12-26 DIAGNOSIS — I10 Essential (primary) hypertension: Secondary | ICD-10-CM | POA: Diagnosis not present

## 2022-12-26 DIAGNOSIS — F0150 Vascular dementia without behavioral disturbance: Secondary | ICD-10-CM | POA: Diagnosis not present

## 2023-01-01 DIAGNOSIS — L905 Scar conditions and fibrosis of skin: Secondary | ICD-10-CM | POA: Diagnosis not present

## 2023-01-01 DIAGNOSIS — L570 Actinic keratosis: Secondary | ICD-10-CM | POA: Diagnosis not present

## 2023-01-21 DIAGNOSIS — I10 Essential (primary) hypertension: Secondary | ICD-10-CM | POA: Diagnosis not present

## 2023-01-21 DIAGNOSIS — E785 Hyperlipidemia, unspecified: Secondary | ICD-10-CM | POA: Diagnosis not present

## 2023-01-23 DIAGNOSIS — Z940 Kidney transplant status: Secondary | ICD-10-CM | POA: Diagnosis not present

## 2023-02-07 DIAGNOSIS — F0150 Vascular dementia without behavioral disturbance: Secondary | ICD-10-CM | POA: Diagnosis not present

## 2023-02-07 DIAGNOSIS — F0390 Unspecified dementia without behavioral disturbance: Secondary | ICD-10-CM | POA: Diagnosis not present

## 2023-02-07 DIAGNOSIS — F22 Delusional disorders: Secondary | ICD-10-CM | POA: Diagnosis not present

## 2023-02-07 DIAGNOSIS — R630 Anorexia: Secondary | ICD-10-CM | POA: Diagnosis not present

## 2023-02-09 ENCOUNTER — Ambulatory Visit
Admission: EM | Admit: 2023-02-09 | Discharge: 2023-02-09 | Disposition: A | Discharge status: Home / Self Care | Payer: PPO | Attending: Physician Assistant | Admitting: Physician Assistant

## 2023-02-09 DIAGNOSIS — N3001 Acute cystitis with hematuria: Secondary | ICD-10-CM | POA: Insufficient documentation

## 2023-02-09 LAB — POCT URINALYSIS DIP (MANUAL ENTRY)
Bilirubin, UA: NEGATIVE
Glucose, UA: NEGATIVE mg/dL
Ketones, POC UA: NEGATIVE mg/dL
Nitrite, UA: POSITIVE — AB
Protein Ur, POC: 30 mg/dL — AB
Spec Grav, UA: 1.025 (ref 1.010–1.025)
Urobilinogen, UA: 1 E.U./dL
pH, UA: 6 (ref 5.0–8.0)

## 2023-02-09 MED ORDER — CEPHALEXIN 500 MG PO CAPS: 500.0000 mg | ORAL_CAPSULE | Freq: Three times a day (TID) | ORAL | 0 refills | Status: DC

## 2023-02-09 NOTE — ED Triage Notes (Signed)
Pt reports she is having frequent urination, burning when urinating, and discomfort in her vagina.

## 2023-02-09 NOTE — ED Provider Notes (Signed)
[facility]    CSN: [id number] Arrival date & time: 02/09/23  4098      History   Chief Complaint No chief complaint on file.   HPI Kristine [name] is a 82 y.o. female.   Patient presents today with a 24-hour history of UTI symptoms.  She reports dysuria and urinary frequency.  Denies any urinary urgency, hematuria, vaginal symptoms, fever, nausea, vomiting.  She does have a history of recurrent UTI with last episode October 2023 at which point she was treated with Keflex.  Reports that generally she does well with this medication.  She has not had additional antibiotics in the past 90 days.  She denies any recent urogenital procedure or self-catheterization.  Denies history of nephrolithiasis.  Denies history of diabetes and does not take SGLT2 inhibitor.  She is status post kidney transplant several years ago and is compliant with immunosuppressive medications.    Past Medical History:  Diagnosis Date   Chronic kidney disease    had kidney transplant   Vascular dementia without behavioral disturbance     Patient Active Problem List   Diagnosis Date Noted   Pressure injury of skin 07/28/2022   Acute metabolic encephalopathy 07/27/2022   Severe sepsis 07/27/2022   HTN (hypertension) 07/27/2022   Encounter for screening fecal occult blood testing 07/11/2022   Encounter for well woman exam with routine gynecological exam 07/11/2022   Loss of weight 09/05/2021   Generalized abdominal pain 09/05/2021   Vaginal atrophy 03/22/2021   Symptoms of urinary tract infection 03/22/2021   History of kidney transplant 03/22/2021   Endometrial hyperplasia without atypia 01/19/2021   PMB (postmenopausal bleeding)    Abnormal endometrial ultrasound    Cervical stenosis (uterine cervix)    Tubular adenoma 07/22/2012   DIARRHEA 12/27/2009   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    Past Surgical History:  Procedure Laterality Date   CHOLECYSTECTOMY     COLONOSCOPY  08/28/2012    Procedure: COLONOSCOPY;  Surgeon: Malissa Hippo, MD;  [facility];  Service: Endoscopy;  Laterality: N/A;  930   COLONOSCOPY WITH PROPOFOL N/A 07/14/2020   Procedure: COLONOSCOPY WITH PROPOFOL;  Surgeon: Malissa Hippo, MD;  [facility];  Service: Endoscopy;  Laterality: N/A;  930   HYSTEROSCOPY WITH D & C N/A 01/11/2021   Procedure: DILATATION AND CURETTAGE /HYSTEROSCOPY;  Surgeon: Vena Austria, MD;  Location: [facility];  Service: Gynecology;  Laterality: N/A;   KIDNEY TRANSPLANT     orif left wrist     TONSILLECTOMY      OB History     Gravida  2   Para  2   Term  2   Preterm      AB      Living  2      SAB      IAB      Ectopic      Multiple      Live Births  2            Home Medications    Prior to Admission medications   Medication Sig Start Date End Date Taking? Authorizing Provider  cephALEXin (KEFLEX) 500 MG capsule Take 1 capsule (500 mg total) by mouth 3 (three) times daily. 02/09/23  Yes Joshalyn Ancheta K, PA-C  amLODipine (NORVASC) 2.5 MG tablet Take 2.5 mg by mouth at bedtime.  03/02/20   [provider]  aspirin EC 81 MG tablet Take 81 mg by mouth daily.  [provider]  Cholecalciferol (VITAMIN D) 2000 UNITS CAPS Take 2,000 Units by mouth daily.     [provider]  donepezil (ARICEPT) 10 MG tablet Take 10 mg by mouth daily. 07/17/22   [provider]  estradiol (ESTRACE VAGINAL) 0.1 MG/GM vaginal cream Use 0.5 gm in vagina 2-3 x weekly 10/20/21   Cyril Mourning A, NP  mycophenolate (MYFORTIC) 180 MG EC tablet Take 540 mg by mouth 2 (two) times daily.    [provider]  simvastatin (ZOCOR) 20 MG tablet Take 20 mg by mouth at bedtime.     [provider]  tacrolimus (PROGRAF) 0.5 MG capsule Take 0.5 mg by mouth 2 (two) times daily.     [provider]    Family History History reviewed. No pertinent family history.  Social History Social History    Tobacco Use   Smoking status: Former   Smokeless tobacco: Never   Tobacco comments:    quit in 1977  Vaping Use   Vaping Use: Never used  Substance Use Topics   Alcohol use: No   Drug use: No     Allergies   Patient has no known allergies.   Review of Systems Review of Systems  Constitutional:  Positive for activity change. Negative for appetite change, fatigue and fever.  Gastrointestinal:  Negative for abdominal pain, diarrhea, nausea and vomiting.  Genitourinary:  Positive for dysuria and frequency. Negative for pelvic pain, urgency, vaginal bleeding, vaginal discharge and vaginal pain.  Musculoskeletal:  Negative for arthralgias and myalgias.     Physical Exam Triage Vital Signs ED Triage Vitals  Enc Vitals Group     BP 02/09/23 1038 (!) 140/69     Pulse Rate 02/09/23 1038 60     Resp 02/09/23 1038 18     Temp 02/09/23 1038 98.1 F (36.7 C)     Temp Source 02/09/23 1038 Oral     SpO2 02/09/23 1038 96 %     Weight --      Height --      Head Circumference --      Peak Flow --      Pain Score 02/09/23 1039 5     Pain Loc --      Pain Edu? --      Excl. in [place]? --    No data found.  Updated Vital Signs BP (!) 140/69 (BP Location: Right Arm)   Pulse 60   Temp 98.1 F (36.7 C) (Oral)   Resp 18   SpO2 96%   Visual Acuity Right Eye Distance:   Left Eye Distance:   Bilateral Distance:    Right Eye Near:   Left Eye Near:    Bilateral Near:     Physical Exam Vitals reviewed.  Constitutional:      General: She is awake. She is not in acute distress.    Appearance: Normal appearance. She is well-developed. She is not ill-appearing.     Comments: Very pleasant female appears stated age in no acute distress sitting comfortably in exam room  HENT:     Head: Normocephalic and atraumatic.  Cardiovascular:     Rate and Rhythm: Normal rate and regular rhythm.     Heart sounds: S1 normal and S2 normal. Murmur heard.  Pulmonary:     Effort: Pulmonary  effort is normal.     Breath sounds: Normal breath sounds. No wheezing, rhonchi or rales.     Comments: Clear to auscultation bilaterally Abdominal:  General: Bowel sounds are normal.     Palpations: Abdomen is soft.     Tenderness: There is no abdominal tenderness. There is no right CVA tenderness, left CVA tenderness, guarding or rebound.     Comments: Benign abdominal exam  Psychiatric:        Behavior: Behavior is cooperative.      UC Treatments / Results  Labs (all labs ordered are listed, but only abnormal results are displayed) Labs Reviewed  POCT URINALYSIS DIP (MANUAL ENTRY) - Abnormal; Notable for the following components:      Result Value   Blood, UA moderate (*)    Protein Ur, POC =30 (*)    Nitrite, UA Positive (*)    Leukocytes, UA Small (1+) (*)    All other components within normal limits  URINE CULTURE    EKG   Radiology No results found.  Procedures Procedures (including critical care time)  Medications Ordered in UC Medications - No data to display  Initial Impression / Assessment and Plan / UC Course  I have reviewed the triage vital signs and the nursing notes.  Pertinent labs & imaging results that were available during my care of the patient were reviewed by me and considered in my medical decision making (see chart for details).     Patient is well-appearing, afebrile, nontoxic, nontachycardic.  UA is consistent with UTI.  Will treat with Keflex 500 mg 3 times daily for 5 days.  No indication for dose adjustment based on metabolic panel from 11/01/2022 with creatinine of 0.61 and calculated creatinine clearance of 56.52 mL/min.  Urine culture was sent off we discussed potential need to change antibiotics based on culture results.  We will contact her if we need to adjust her treatment plan.  She is to push fluids and rest.  We discussed that if at any point she has worsening symptoms including fever, persistent urinary symptoms despite being  on antibiotics for several days, nausea/vomiting interfering with oral intake, weakness she needs to be seen immediately.  Strict return precautions given.  Final Clinical Impressions(s) / UC Diagnoses   Final diagnoses:  Acute cystitis with hematuria     Discharge Instructions      We are treating you for a urinary tract infection.  Please use cephalexin 3 times a day for the next 5 days.  We will contact you if we need to change your treatment based on your culture results.  Make sure that you are drinking plenty of fluid.  Follow-up with your PCP next week.  If at any point anything worsens and you develop a fever, nausea/vomiting, weakness you need to be seen in the emergency room immediately.     ED Prescriptions     Medication Sig Dispense Auth. Provider   cephALEXin (KEFLEX) 500 MG capsule Take 1 capsule (500 mg total) by mouth 3 (three) times daily. 15 capsule Wally Shevchenko K, PA-C      PDMP not reviewed this encounter.   Jeani Hawking, PA-C 02/09/23 1102

## 2023-02-09 NOTE — Discharge Instructions (Signed)
We are treating you for a urinary tract infection.  Please use cephalexin 3 times a day for the next 5 days.  We will contact you if we need to change your treatment based on your culture results.  Make sure that you are drinking plenty of fluid.  Follow-up with your PCP next week.  If at any point anything worsens and you develop a fever, nausea/vomiting, weakness you need to be seen in the emergency room immediately.

## 2023-02-12 LAB — URINE CULTURE: Culture: >=100000 — AB

## 2023-02-22 DIAGNOSIS — Z940 Kidney transplant status: Secondary | ICD-10-CM | POA: Diagnosis not present

## 2023-03-22 DIAGNOSIS — M859 Disorder of bone density and structure, unspecified: Secondary | ICD-10-CM | POA: Diagnosis not present

## 2023-03-22 DIAGNOSIS — Z8262 Family history of osteoporosis: Secondary | ICD-10-CM | POA: Diagnosis not present

## 2023-03-22 DIAGNOSIS — Z1231 Encounter for screening mammogram for malignant neoplasm of breast: Secondary | ICD-10-CM | POA: Diagnosis not present

## 2023-03-27 DIAGNOSIS — Z940 Kidney transplant status: Secondary | ICD-10-CM | POA: Diagnosis not present

## 2023-03-28 DIAGNOSIS — R922 Inconclusive mammogram: Secondary | ICD-10-CM | POA: Diagnosis not present

## 2023-05-03 DIAGNOSIS — Z940 Kidney transplant status: Secondary | ICD-10-CM | POA: Diagnosis not present

## 2023-06-04 DIAGNOSIS — M1711 Unilateral primary osteoarthritis, right knee: Secondary | ICD-10-CM | POA: Diagnosis not present

## 2023-06-06 ENCOUNTER — Ambulatory Visit
Admission: EM | Admit: 2023-06-06 | Discharge: 2023-06-06 | Disposition: A | Discharge status: Home / Self Care | Payer: PPO | Attending: Family Medicine | Admitting: Family Medicine

## 2023-06-06 DIAGNOSIS — K5900 Constipation, unspecified: Secondary | ICD-10-CM | POA: Diagnosis not present

## 2023-06-06 DIAGNOSIS — N390 Urinary tract infection, site not specified: Secondary | ICD-10-CM | POA: Insufficient documentation

## 2023-06-06 LAB — POCT URINALYSIS DIP (MANUAL ENTRY)
Bilirubin, UA: NEGATIVE
Glucose, UA: NEGATIVE mg/dL
Ketones, POC UA: NEGATIVE mg/dL
Nitrite, UA: NEGATIVE
Protein Ur, POC: NEGATIVE mg/dL
Spec Grav, UA: 1.02 (ref 1.010–1.025)
Urobilinogen, UA: 1 E.U./dL
pH, UA: 6.5 (ref 5.0–8.0)

## 2023-06-06 MED ORDER — CEPHALEXIN 500 MG PO CAPS: 500.0000 mg | ORAL_CAPSULE | Freq: Two times a day (BID) | ORAL | 0 refills | Status: DC

## 2023-06-06 NOTE — Discharge Instructions (Signed)
We have sent over antibiotics to start treating a urinary tract infection while awaiting your urine culture.  If we need to make any changes based on these results someone should reach out and discussed this with you.  Regarding your constipation, you may start a fiber supplement daily as well as a probiotic.  Make sure to drink plenty of water and eat a high-fiber diet.  You may take either Colace or MiraLAX as needed if your bowels are not moving regularly additionally.

## 2023-06-06 NOTE — ED Triage Notes (Signed)
Pt c/o UTI sx's , woke up in the middle of the night with pain, and discomfrot in the abdomen with the urge to go immediately. says  that it was  sudden on set. Burning with urination, frequency and urgency.

## 2023-06-06 NOTE — ED Provider Notes (Signed)
[facility]    CSN: [id number] Arrival date & time: 06/06/23  6387      History   Chief Complaint No chief complaint on file.   HPI Kristine [name] is a 82 y.o. female.   Patient presenting today with new onset urinary frequency and urgency, dysuria that started overnight.  Denies fever, chills, nausea, vomiting, back pain, hematuria.  So far trying to increase fluids with no relief.  History of urinary tract infections.  She is also complaining of some constipation issues since the weekend.  She has tried 2 doses of Dulcolax with no benefit so far.  Wanting advice on what she should be taking.  Denies abdominal pain, inability to pass gas, nausea, vomiting, bleeding or discharge per rectum.    Past Medical History:  Diagnosis Date   Chronic kidney disease    had kidney transplant   Vascular dementia without behavioral disturbance [facility])     Patient Active Problem List   Diagnosis Date Noted   Pressure injury of skin 07/28/2022   Acute metabolic encephalopathy 07/27/2022   Severe sepsis (HCC) 07/27/2022   HTN (hypertension) 07/27/2022   Encounter for screening fecal occult blood testing 07/11/2022   Encounter for well woman exam with routine gynecological exam 07/11/2022   Loss of weight 09/05/2021   Generalized abdominal pain 09/05/2021   Vaginal atrophy 03/22/2021   Symptoms of urinary tract infection 03/22/2021   History of kidney transplant 03/22/2021   Endometrial hyperplasia without atypia 01/19/2021   PMB (postmenopausal bleeding)    Abnormal endometrial ultrasound    Cervical stenosis (uterine cervix)    Tubular adenoma 07/22/2012   DIARRHEA 12/27/2009   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    Past Surgical History:  Procedure Laterality Date   CHOLECYSTECTOMY     COLONOSCOPY  08/28/2012   Procedure: COLONOSCOPY;  Surgeon: Malissa Hippo, MD;  Location: [facility];  Service: Endoscopy;  Laterality: N/A;  930   COLONOSCOPY WITH PROPOFOL N/A  07/14/2020   Procedure: COLONOSCOPY WITH PROPOFOL;  Surgeon: Malissa Hippo, MD;  Location: [facility];  Service: Endoscopy;  Laterality: N/A;  930   HYSTEROSCOPY WITH D & C N/A 01/11/2021   Procedure: DILATATION AND CURETTAGE /HYSTEROSCOPY;  Surgeon: Vena Austria, MD;  Location: [facility];  Service: Gynecology;  Laterality: N/A;   KIDNEY TRANSPLANT     orif left wrist     TONSILLECTOMY      OB History     Gravida  2   Para  2   Term  2   Preterm      AB      Living  2      SAB      IAB      Ectopic      Multiple      Live Births  2            Home Medications    Prior to Admission medications   Medication Sig Start Date End Date Taking? Authorizing Provider  amLODipine (NORVASC) 2.5 MG tablet Take 2.5 mg by mouth at bedtime.  03/02/20   [provider]  aspirin EC 81 MG tablet Take 81 mg by mouth daily.    [provider]  cephALEXin (KEFLEX) 500 MG capsule Take 1 capsule (500 mg total) by mouth 2 (two) times daily. 06/06/23   Particia Nearing, PA-C  Cholecalciferol (VITAMIN D) 2000 UNITS CAPS Take 2,000 Units by mouth daily.     [provider]  donepezil (ARICEPT) 10 MG tablet Take 10 mg by mouth daily. 07/17/22   [provider]  estradiol (ESTRACE VAGINAL) 0.1 MG/GM vaginal cream Use 0.5 gm in vagina 2-3 x weekly 10/20/21   Cyril Mourning A, NP  mycophenolate (MYFORTIC) 180 MG EC tablet Take 540 mg by mouth 2 (two) times daily.    [provider]  simvastatin (ZOCOR) 20 MG tablet Take 20 mg by mouth at bedtime.     [provider]  tacrolimus (PROGRAF) 0.5 MG capsule Take 0.5 mg by mouth 2 (two) times daily.     [provider]    Family History History reviewed. No pertinent family history.  Social History Social History   Tobacco Use   Smoking status: Former   Smokeless tobacco: Never   Tobacco comments:    quit in 1977  Vaping Use   Vaping status: Never Used   Substance Use Topics   Alcohol use: No   Drug use: No     Allergies   Patient has no known allergies.   Review of Systems Review of Systems Per HPI  Physical Exam Triage Vital Signs ED Triage Vitals  Encounter Vitals Group     BP 06/06/23 0952 133/79     Systolic BP Percentile --      Diastolic BP Percentile --      Pulse Rate 06/06/23 0952 67     Resp 06/06/23 0952 15     Temp 06/06/23 0952 98.3 F (36.8 C)     Temp Source 06/06/23 0952 Oral     SpO2 06/06/23 0952 95 %     Weight --      Height --      Head Circumference --      Peak Flow --      Pain Score 06/06/23 0955 6     Pain Loc --      Pain Education --      Exclude from Growth Chart --    No data found.  Updated Vital Signs BP 133/79 (BP Location: Right Arm)   Pulse 67   Temp 98.3 F (36.8 C) (Oral)   Resp 15   SpO2 95%   Visual Acuity Right Eye Distance:   Left Eye Distance:   Bilateral Distance:    Right Eye Near:   Left Eye Near:    Bilateral Near:     Physical Exam Vitals and nursing note reviewed.  Constitutional:      Appearance: Normal appearance. She is not ill-appearing.  HENT:     Head: Atraumatic.  Eyes:     Extraocular Movements: Extraocular movements intact.     Conjunctiva/sclera: Conjunctivae normal.  Cardiovascular:     Rate and Rhythm: Normal rate and regular rhythm.     Heart sounds: Normal heart sounds.  Pulmonary:     Effort: Pulmonary effort is normal.     Breath sounds: Normal breath sounds.  Abdominal:     General: Bowel sounds are normal. There is no distension.     Palpations: Abdomen is soft.     Tenderness: There is no abdominal tenderness. There is no right CVA tenderness, left CVA tenderness or guarding.  Musculoskeletal:        General: Normal range of motion.     Cervical back: Normal range of motion and neck supple.  Skin:    General: Skin is warm and dry.  Neurological:     Mental Status: She is alert and oriented to person,  place, and time.   Psychiatric:        Mood and Affect: Mood normal.        Thought Content: Thought content normal.        Judgment: Judgment normal.      UC Treatments / Results  Labs (all labs ordered are listed, but only abnormal results are displayed) Labs Reviewed  POCT URINALYSIS DIP (MANUAL ENTRY) - Abnormal; Notable for the following components:      Result Value   Clarity, UA cloudy (*)    Blood, UA trace-intact (*)    Leukocytes, UA Small (1+) (*)    All other components within normal limits  URINE CULTURE    EKG   Radiology No results found.  Procedures Procedures (including critical care time)  Medications Ordered in UC Medications - No data to display  Initial Impression / Assessment and Plan / UC Course  I have reviewed the triage vital signs and the nursing notes.  Pertinent labs & imaging results that were available during my care of the patient were reviewed by me and considered in my medical decision making (see chart for details).     Vitals and exam reassuring today, urinalysis but evidence of a urinary tract infection.  Treat with Keflex and await urine culture, low-dose leg if needed.  Push fluids, return for worsening symptoms. Regarding her constipation, no red flag findings today, discussed fiber, increasing fluids, MiraLAX or Colace.  Follow-up with PCP for recheck. Final Clinical Impressions(s) / UC Diagnoses   Final diagnoses:  Acute lower UTI  Constipation, unspecified constipation type     Discharge Instructions      We have sent over antibiotics to start treating a urinary tract infection while awaiting your urine culture.  If we need to make any changes based on these results someone should reach out and discussed this with you.  Regarding your constipation, you may start a fiber supplement daily as well as a probiotic.  Make sure to drink plenty of water and eat a high-fiber diet.  You may take either Colace or MiraLAX as needed if your bowels are  not moving regularly additionally.    ED Prescriptions     Medication Sig Dispense Auth. Provider   cephALEXin (KEFLEX) 500 MG capsule Take 1 capsule (500 mg total) by mouth 2 (two) times daily. 10 capsule Particia Nearing, [place]      PDMP not reviewed this encounter.   Particia Nearing, [place] 06/06/23 1433

## 2023-06-09 LAB — URINE CULTURE: Culture: 60000 — AB

## 2023-06-13 DIAGNOSIS — Z940 Kidney transplant status: Secondary | ICD-10-CM | POA: Diagnosis not present

## 2023-07-09 DIAGNOSIS — R410 Disorientation, unspecified: Secondary | ICD-10-CM | POA: Diagnosis not present

## 2023-07-09 DIAGNOSIS — Z6821 Body mass index (BMI) 21.0-21.9, adult: Secondary | ICD-10-CM | POA: Diagnosis not present

## 2023-07-09 DIAGNOSIS — R300 Dysuria: Secondary | ICD-10-CM | POA: Diagnosis not present

## 2023-07-09 DIAGNOSIS — N390 Urinary tract infection, site not specified: Secondary | ICD-10-CM | POA: Diagnosis not present

## 2023-07-10 ENCOUNTER — Encounter (HOSPITAL_COMMUNITY): Payer: Self-pay

## 2023-07-10 ENCOUNTER — Inpatient Hospital Stay (HOSPITAL_COMMUNITY)
Admission: EM | Admit: 2023-07-10 | Discharge: 2023-07-10 | DRG: 066 | Disposition: A | Discharge status: Other Acute Inpatient Hospital | Payer: PPO | Attending: Family Medicine | Admitting: Family Medicine

## 2023-07-10 ENCOUNTER — Other Ambulatory Visit: Payer: Self-pay

## 2023-07-10 ENCOUNTER — Emergency Department (HOSPITAL_COMMUNITY): Payer: PPO

## 2023-07-10 DIAGNOSIS — I10 Essential (primary) hypertension: Secondary | ICD-10-CM | POA: Diagnosis present

## 2023-07-10 DIAGNOSIS — M6281 Muscle weakness (generalized): Secondary | ICD-10-CM | POA: Diagnosis not present

## 2023-07-10 DIAGNOSIS — Z940 Kidney transplant status: Secondary | ICD-10-CM

## 2023-07-10 DIAGNOSIS — G9341 Metabolic encephalopathy: Secondary | ICD-10-CM | POA: Diagnosis present

## 2023-07-10 DIAGNOSIS — I6203 Nontraumatic chronic subdural hemorrhage: Secondary | ICD-10-CM | POA: Diagnosis not present

## 2023-07-10 DIAGNOSIS — F05 Delirium due to known physiological condition: Secondary | ICD-10-CM | POA: Diagnosis not present

## 2023-07-10 DIAGNOSIS — R739 Hyperglycemia, unspecified: Secondary | ICD-10-CM | POA: Diagnosis present

## 2023-07-10 DIAGNOSIS — W19XXXD Unspecified fall, subsequent encounter: Secondary | ICD-10-CM | POA: Diagnosis not present

## 2023-07-10 DIAGNOSIS — Z87440 Personal history of urinary (tract) infections: Secondary | ICD-10-CM | POA: Diagnosis not present

## 2023-07-10 DIAGNOSIS — F0390 Unspecified dementia without behavioral disturbance: Secondary | ICD-10-CM | POA: Diagnosis present

## 2023-07-10 DIAGNOSIS — S199XXA Unspecified injury of neck, initial encounter: Secondary | ICD-10-CM | POA: Diagnosis not present

## 2023-07-10 DIAGNOSIS — S065XAA Traumatic subdural hemorrhage with loss of consciousness status unknown, initial encounter: Secondary | ICD-10-CM | POA: Diagnosis not present

## 2023-07-10 DIAGNOSIS — R262 Difficulty in walking, not elsewhere classified: Secondary | ICD-10-CM | POA: Diagnosis not present

## 2023-07-10 DIAGNOSIS — Z9049 Acquired absence of other specified parts of digestive tract: Secondary | ICD-10-CM | POA: Diagnosis not present

## 2023-07-10 DIAGNOSIS — M1711 Unilateral primary osteoarthritis, right knee: Secondary | ICD-10-CM | POA: Diagnosis not present

## 2023-07-10 DIAGNOSIS — Z982 Presence of cerebrospinal fluid drainage device: Secondary | ICD-10-CM | POA: Diagnosis not present

## 2023-07-10 DIAGNOSIS — E785 Hyperlipidemia, unspecified: Secondary | ICD-10-CM | POA: Diagnosis present

## 2023-07-10 DIAGNOSIS — Z0389 Encounter for observation for other suspected diseases and conditions ruled out: Secondary | ICD-10-CM | POA: Diagnosis not present

## 2023-07-10 DIAGNOSIS — Z79899 Other long term (current) drug therapy: Secondary | ICD-10-CM

## 2023-07-10 DIAGNOSIS — Z7982 Long term (current) use of aspirin: Secondary | ICD-10-CM

## 2023-07-10 DIAGNOSIS — S0990XA Unspecified injury of head, initial encounter: Secondary | ICD-10-CM | POA: Diagnosis not present

## 2023-07-10 DIAGNOSIS — R4182 Altered mental status, unspecified: Secondary | ICD-10-CM | POA: Diagnosis not present

## 2023-07-10 DIAGNOSIS — M25551 Pain in right hip: Secondary | ICD-10-CM | POA: Diagnosis not present

## 2023-07-10 DIAGNOSIS — D84821 Immunodeficiency due to drugs: Secondary | ICD-10-CM | POA: Diagnosis not present

## 2023-07-10 DIAGNOSIS — G9389 Other specified disorders of brain: Secondary | ICD-10-CM | POA: Diagnosis not present

## 2023-07-10 DIAGNOSIS — R9401 Abnormal electroencephalogram [EEG]: Secondary | ICD-10-CM | POA: Diagnosis not present

## 2023-07-10 DIAGNOSIS — R488 Other symbolic dysfunctions: Secondary | ICD-10-CM | POA: Diagnosis not present

## 2023-07-10 DIAGNOSIS — I6200 Nontraumatic subdural hemorrhage, unspecified: Principal | ICD-10-CM | POA: Diagnosis present

## 2023-07-10 DIAGNOSIS — Z79621 Long term (current) use of calcineurin inhibitor: Secondary | ICD-10-CM | POA: Diagnosis not present

## 2023-07-10 DIAGNOSIS — Y92009 Unspecified place in unspecified non-institutional (private) residence as the place of occurrence of the external cause: Secondary | ICD-10-CM

## 2023-07-10 DIAGNOSIS — F0150 Vascular dementia without behavioral disturbance: Secondary | ICD-10-CM | POA: Diagnosis not present

## 2023-07-10 DIAGNOSIS — Z4682 Encounter for fitting and adjustment of non-vascular catheter: Secondary | ICD-10-CM | POA: Diagnosis not present

## 2023-07-10 DIAGNOSIS — Z9181 History of falling: Secondary | ICD-10-CM | POA: Diagnosis not present

## 2023-07-10 DIAGNOSIS — Z452 Encounter for adjustment and management of vascular access device: Secondary | ICD-10-CM | POA: Diagnosis not present

## 2023-07-10 DIAGNOSIS — I6201 Nontraumatic acute subdural hemorrhage: Secondary | ICD-10-CM | POA: Diagnosis not present

## 2023-07-10 DIAGNOSIS — Z905 Acquired absence of kidney: Secondary | ICD-10-CM | POA: Diagnosis not present

## 2023-07-10 DIAGNOSIS — R569 Unspecified convulsions: Secondary | ICD-10-CM | POA: Diagnosis not present

## 2023-07-10 DIAGNOSIS — S065X0D Traumatic subdural hemorrhage without loss of consciousness, subsequent encounter: Secondary | ICD-10-CM | POA: Diagnosis not present

## 2023-07-10 DIAGNOSIS — Z87891 Personal history of nicotine dependence: Secondary | ICD-10-CM | POA: Diagnosis not present

## 2023-07-10 DIAGNOSIS — S065X0A Traumatic subdural hemorrhage without loss of consciousness, initial encounter: Secondary | ICD-10-CM | POA: Diagnosis not present

## 2023-07-10 DIAGNOSIS — I129 Hypertensive chronic kidney disease with stage 1 through stage 4 chronic kidney disease, or unspecified chronic kidney disease: Secondary | ICD-10-CM | POA: Diagnosis not present

## 2023-07-10 DIAGNOSIS — N189 Chronic kidney disease, unspecified: Secondary | ICD-10-CM | POA: Diagnosis not present

## 2023-07-10 LAB — COMPREHENSIVE METABOLIC PANEL
ALT: 14 U/L (ref 0–44)
AST: 18 U/L (ref 15–41)
Albumin: 3.5 g/dL (ref 3.5–5.0)
Alkaline Phosphatase: 62 U/L (ref 38–126)
Anion gap: 9 (ref 5–15)
BUN: 7 mg/dL — ABNORMAL LOW (ref 8–23)
CO2: 25 mmol/L (ref 22–32)
Calcium: 9.4 mg/dL (ref 8.9–10.3)
Chloride: 99 mmol/L (ref 98–111)
Creatinine, Ser: 0.57 mg/dL (ref 0.44–1.00)
GFR, Estimated: >60 mL/min (ref >60)
Glucose, Bld: 126 mg/dL — ABNORMAL HIGH (ref 70–99)
Potassium: 3.8 mmol/L (ref 3.5–5.1)
Sodium: 133 mmol/L — ABNORMAL LOW (ref 135–145)
Total Bilirubin: 1 mg/dL (ref 0.3–1.2)
Total Protein: 7.1 g/dL (ref 6.5–8.1)

## 2023-07-10 LAB — URINALYSIS, ROUTINE W REFLEX MICROSCOPIC
Bacteria, UA: NONE SEEN
Bilirubin Urine: NEGATIVE
Glucose, UA: NEGATIVE mg/dL
Ketones, ur: NEGATIVE mg/dL
Leukocytes,Ua: NEGATIVE
Nitrite: NEGATIVE
Protein, ur: NEGATIVE mg/dL
Specific Gravity, Urine: 1.004 — ABNORMAL LOW (ref 1.005–1.030)
pH: 7 (ref 5.0–8.0)

## 2023-07-10 LAB — CBC WITH DIFFERENTIAL/PLATELET
Abs Immature Granulocytes: 0.02 10*3/uL (ref 0.00–0.07)
Basophils Absolute: 0 10*3/uL (ref 0.0–0.1)
Basophils Relative: 0 %
Eosinophils Absolute: 0 10*3/uL (ref 0.0–0.5)
Eosinophils Relative: 0 %
HCT: 45.9 % (ref 36.0–46.0)
Hemoglobin: 15.1 g/dL — ABNORMAL HIGH (ref 12.0–15.0)
Immature Granulocytes: 0 %
Lymphocytes Relative: 11 %
Lymphs Abs: 0.9 10*3/uL (ref 0.7–4.0)
MCH: 29.2 pg (ref 26.0–34.0)
MCHC: 32.9 g/dL (ref 30.0–36.0)
MCV: 88.8 fL (ref 80.0–100.0)
Monocytes Absolute: 0.7 10*3/uL (ref 0.1–1.0)
Monocytes Relative: 9 %
Neutro Abs: 6.3 10*3/uL (ref 1.7–7.7)
Neutrophils Relative %: 80 %
Platelets: 178 10*3/uL (ref 150–400)
RBC: 5.17 MIL/uL — ABNORMAL HIGH (ref 3.87–5.11)
RDW: 13 % (ref 11.5–15.5)
WBC: 8 10*3/uL (ref 4.0–10.5)
nRBC: 0 % (ref 0.0–0.2)

## 2023-07-10 MED ORDER — SODIUM CHLORIDE 0.9 % IV BOLUS
1000.0000 mL | Freq: Once | INTRAVENOUS | Status: AC
  Administered 2023-07-10: 1000 mL via INTRAVENOUS

## 2023-07-10 NOTE — ED Provider Notes (Signed)
[place] [facility] AT [facility] Provider Note   CSN: [id number] Arrival date & time: 07/10/23  3244     History {Add pertinent medical, surgical, social history, OB history to HPI:1} Chief Complaint  Patient presents with   Altered Mental Status    Kristine [name] is a 82 y.o. female.  Patient has a history of dementia hypertension and recently had a fall on September 8.  Her husband who takes care of her stated that she is becoming more confused and less mobile  The history is provided by a relative. No language interpreter was used.  Altered Mental Status Presenting symptoms: behavior changes and confusion   Severity:  Moderate Most recent episode:  More than 2 days ago Episode history:  Continuous Timing:  Constant Progression:  Waxing and waning Chronicity:  Recurrent Context: dementia   Context: not alcohol use   Associated symptoms: no abdominal pain        Home Medications Prior to Admission medications   Medication Sig Start Date End Date Taking? Authorizing Provider  amLODipine (NORVASC) 2.5 MG tablet Take 2.5 mg by mouth at bedtime.  03/02/20   [provider]  aspirin EC 81 MG tablet Take 81 mg by mouth daily.    [provider]  cephALEXin (KEFLEX) 500 MG capsule Take 1 capsule (500 mg total) by mouth 2 (two) times daily. 06/06/23   Particia Nearing, PA-C  Cholecalciferol (VITAMIN D) 2000 UNITS CAPS Take 2,000 Units by mouth daily.     [provider]  donepezil (ARICEPT) 10 MG tablet Take 10 mg by mouth daily. 07/17/22   [provider]  estradiol (ESTRACE VAGINAL) 0.1 MG/GM vaginal cream Use 0.5 gm in vagina 2-3 x weekly 10/20/21   Cyril Mourning A, NP  mycophenolate (MYFORTIC) 180 MG EC tablet Take 540 mg by mouth 2 (two) times daily.    [provider]  simvastatin (ZOCOR) 20 MG tablet Take 20 mg by mouth at bedtime.     [provider]  tacrolimus (PROGRAF) 0.5 MG capsule  Take 0.5 mg by mouth 2 (two) times daily.     [provider]      Allergies    Patient has no known allergies.    Review of Systems   Review of Systems  Unable to perform ROS: Dementia  Gastrointestinal:  Negative for abdominal pain.  Psychiatric/Behavioral:  Positive for confusion.     Physical Exam Updated Vital Signs BP (!) 142/72   Pulse (!) 58   Temp 98.6 F (37 C) (Oral)   Resp 19   Ht 5\' 1"  (1.549 m)   Wt 52.2 kg   SpO2 96%   BMI 21.73 kg/m  Physical Exam Vitals and nursing note reviewed.  Constitutional:      Appearance: She is well-developed.  HENT:     Head: Normocephalic.  Eyes:     General: No scleral icterus.    Conjunctiva/sclera: Conjunctivae normal.  Neck:     Thyroid: No thyromegaly.  Cardiovascular:     Rate and Rhythm: Normal rate and regular rhythm.     Heart sounds: No murmur heard.    No friction rub. No gallop.  Pulmonary:     Breath sounds: No stridor. No wheezing or rales.  Chest:     Chest wall: No tenderness.  Abdominal:     General: There is no distension.     Tenderness: There is no abdominal tenderness. There is no rebound.  Musculoskeletal:  Cervical back: Neck supple.     Comments: Patient moves all extremities but has weakness in her lower extremities  Lymphadenopathy:     Cervical: No cervical adenopathy.  Skin:    Findings: No erythema or rash.  Neurological:     Mental Status: She is alert.     Motor: No abnormal muscle tone.     Coordination: Coordination normal.     Comments: Patient is oriented to person only.  Normally she can have conversations with her family but has memory loss but now she does not remember her son     ED Results / Procedures / Treatments   Labs (all labs ordered are listed, but only abnormal results are displayed) Labs Reviewed  URINALYSIS, ROUTINE W REFLEX MICROSCOPIC - Abnormal; Notable for the following components:      Result Value   Color, Urine STRAW (*)    Specific  Gravity, Urine 1.004 (*)    Hgb urine dipstick SMALL (*)    All other components within normal limits  CBC WITH DIFFERENTIAL/PLATELET - Abnormal; Notable for the following components:   RBC 5.17 (*)    Hemoglobin 15.1 (*)    All other components within normal limits  COMPREHENSIVE METABOLIC PANEL - Abnormal; Notable for the following components:   Sodium 133 (*)    Glucose, Bld 126 (*)    BUN 7 (*)    All other components within normal limits    EKG None  Radiology CT Head Wo Contrast  Result Date: 07/10/2023 CLINICAL DATA:  Head trauma, intracranial arterial injury suspected; Neck trauma, intoxicated or obtunded (Age >= 16y) EXAM: CT HEAD WITHOUT CONTRAST CT CERVICAL SPINE WITHOUT CONTRAST TECHNIQUE: Multidetector CT imaging of the head and cervical spine was performed following the standard protocol without intravenous contrast. Multiplanar CT image reconstructions of the cervical spine were also generated. RADIATION DOSE REDUCTION: This exam was performed according to the departmental dose-optimization program which includes automated exposure control, adjustment of the mA and/or kV according to patient size and/or use of iterative reconstruction technique. COMPARISON:  CT Head 07/27/22 FINDINGS: CT HEAD FINDINGS Brain: There is a 2 cm acute mixed density right cerebral convexity subdural hematoma. There is 7 mm leftward midline shift. No CT evidence of an acute cortical infarct. No hydrocephalus. There is mass effect on the right lateral ventricular system. Vascular: No hyperdense vessel or unexpected calcification. Skull: Normal. Negative for fracture or focal lesion. Sinuses/Orbits: No middle ear or mastoid effusion. Paranasal sinuses are clear. Bilateral lens replacement. Orbits are otherwise unremarkable. Other: None. CT CERVICAL SPINE FINDINGS Alignment: Straightening of the normal cervical lordosis. Skull base and vertebrae: No acute fracture. No primary bone lesion or focal pathologic  process. Soft tissues and spinal canal: No prevertebral fluid or swelling. No visible canal hematoma. Disc levels:  No evidence of high-grade spinal canal stenosis. Upper chest: Negative. Other: None IMPRESSION: 1. There is a 2 cm acute mixed density right cerebral convexity subdural hematoma with 7 mm leftward midline shift. 2. No acute fracture or traumatic subluxation of the cervical spine. Findings were discussed with Dr. Estell Harpin on 07/10/23 at 11:39 AM. Electronically Signed   By: Lorenza Cambridge M.D.   On: 07/10/2023 11:46   CT Cervical Spine Wo Contrast  Result Date: 07/10/2023 CLINICAL DATA:  Head trauma, intracranial arterial injury suspected; Neck trauma, intoxicated or obtunded (Age >= 16y) EXAM: CT HEAD WITHOUT CONTRAST CT CERVICAL SPINE WITHOUT CONTRAST TECHNIQUE: Multidetector CT imaging of the head and cervical spine was performed  following the standard protocol without intravenous contrast. Multiplanar CT image reconstructions of the cervical spine were also generated. RADIATION DOSE REDUCTION: This exam was performed according to the departmental dose-optimization program which includes automated exposure control, adjustment of the mA and/or kV according to patient size and/or use of iterative reconstruction technique. COMPARISON:  CT Head 07/27/22 FINDINGS: CT HEAD FINDINGS Brain: There is a 2 cm acute mixed density right cerebral convexity subdural hematoma. There is 7 mm leftward midline shift. No CT evidence of an acute cortical infarct. No hydrocephalus. There is mass effect on the right lateral ventricular system. Vascular: No hyperdense vessel or unexpected calcification. Skull: Normal. Negative for fracture or focal lesion. Sinuses/Orbits: No middle ear or mastoid effusion. Paranasal sinuses are clear. Bilateral lens replacement. Orbits are otherwise unremarkable. Other: None. CT CERVICAL SPINE FINDINGS Alignment: Straightening of the normal cervical lordosis. Skull base and vertebrae: No  acute fracture. No primary bone lesion or focal pathologic process. Soft tissues and spinal canal: No prevertebral fluid or swelling. No visible canal hematoma. Disc levels:  No evidence of high-grade spinal canal stenosis. Upper chest: Negative. Other: None IMPRESSION: 1. There is a 2 cm acute mixed density right cerebral convexity subdural hematoma with 7 mm leftward midline shift. 2. No acute fracture or traumatic subluxation of the cervical spine. Findings were discussed with Dr. Estell Harpin on 07/10/23 at 11:39 AM. Electronically Signed   By: Lorenza Cambridge M.D.   On: 07/10/2023 11:46    Procedures Procedures  {Document cardiac monitor, telemetry assessment procedure when appropriate:1}  Medications Ordered in ED Medications  sodium chloride 0.9 % bolus 1,000 mL (1,000 mLs Intravenous New Bag/Given 07/10/23 1111)    ED Course/ Medical Decision Making/ A&P   {  CRITICAL CARE Performed by: Bethann Berkshire Total critical care time: 45 minutes Critical care time was exclusive of separately billable procedures and treating other patients. Critical care was necessary to treat or prevent imminent or life-threatening deterioration. Critical care was time spent personally by me on the following activities: development of treatment plan with patient and/or surrogate as well as nursing, discussions with consultants, evaluation of patient's response to treatment, examination of patient, obtaining history from patient or surrogate, ordering and performing treatments and interventions, ordering and review of laboratory studies, ordering and review of radiographic studies, pulse oximetry and re-evaluation of patient's condition.   Patient with large subdural hematoma.  I spoke with neurosurgery Dr. Conchita Paris and he wishes for the patient to be admitted to hospitalist service over at [facility] and he will see the patient and talk to the family about surgery to remove the subdural   After speaking with the family  they requested that the [facility].  I spoke with neurosurgeon Dr. Yolanda Bonine and he accepted the patient to be transferred to have a Click here for ABCD2, HEART and other calculatorsREFRESH Note before signing :1}                              Medical Decision Making Amount and/or Complexity of Data Reviewed Labs: ordered. Radiology: ordered.  Risk Decision regarding hospitalization.   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  I have spoke with Dr. Yolanda Bonine at [facility] and he will accept the patient and discuss surgical options with the patient  {Document critical care time when appropriate:1} {Document review of labs and clinical decision tools ie heart score, Chads2Vasc2  etc:1}  {Document your independent review of radiology images, and any outside records:1} {Document your discussion with family members, caretakers, and with consultants:1} {Document social determinants of health affecting pt's care:1} {Document your decision making why or why not admission, treatments were needed:1} Final Clinical Impression(s) / ED Diagnoses Final diagnoses:  Subdural hematoma (HCC)    Rx / DC Orders ED Discharge Orders     None

## 2023-07-10 NOTE — ED Notes (Signed)
Pt is alert, not oriented to place, and seems to obey most commands as much as able to understand or follow. She is able to move all extremities. Her face is symmetrical.

## 2023-07-10 NOTE — Discharge Instructions (Signed)
Patient will be transferred to [facility] with Dr. Yolanda Bonine at accepting

## 2023-07-10 NOTE — ED Triage Notes (Addendum)
Pt, from home, presents w/ increasing AMS x3 days.  Per husband, Pt is normally ambulatory and verbal.  Hx of dementia.  Pt had no complaints of pain or n/v/d.   Pt was diagnosed w/ a UTI yesterday at an UC and given Rocephin 1g IM.    Also, Pt had a fall and hit her head over 1 week ago.  Pt's husband would like her checked for a head injury as well.  Pt is not on blood thinners.

## 2023-07-10 NOTE — ED Notes (Signed)
Report given to [facility] and [facility]. All belongings given to husband and son.

## 2023-07-11 DIAGNOSIS — Z452 Encounter for adjustment and management of vascular access device: Secondary | ICD-10-CM | POA: Diagnosis not present

## 2023-07-11 DIAGNOSIS — Z4682 Encounter for fitting and adjustment of non-vascular catheter: Secondary | ICD-10-CM | POA: Diagnosis not present

## 2023-07-11 DIAGNOSIS — R569 Unspecified convulsions: Secondary | ICD-10-CM | POA: Diagnosis not present

## 2023-07-11 DIAGNOSIS — W19XXXD Unspecified fall, subsequent encounter: Secondary | ICD-10-CM | POA: Diagnosis not present

## 2023-07-11 DIAGNOSIS — S065XAA Traumatic subdural hemorrhage with loss of consciousness status unknown, initial encounter: Secondary | ICD-10-CM | POA: Diagnosis not present

## 2023-07-11 DIAGNOSIS — I129 Hypertensive chronic kidney disease with stage 1 through stage 4 chronic kidney disease, or unspecified chronic kidney disease: Secondary | ICD-10-CM | POA: Diagnosis not present

## 2023-07-11 DIAGNOSIS — I6200 Nontraumatic subdural hemorrhage, unspecified: Secondary | ICD-10-CM | POA: Diagnosis not present

## 2023-07-11 DIAGNOSIS — N189 Chronic kidney disease, unspecified: Secondary | ICD-10-CM | POA: Diagnosis not present

## 2023-07-11 DIAGNOSIS — E785 Hyperlipidemia, unspecified: Secondary | ICD-10-CM | POA: Diagnosis not present

## 2023-07-11 DIAGNOSIS — Z940 Kidney transplant status: Secondary | ICD-10-CM | POA: Diagnosis not present

## 2023-07-11 DIAGNOSIS — M1711 Unilateral primary osteoarthritis, right knee: Secondary | ICD-10-CM | POA: Diagnosis not present

## 2023-07-11 DIAGNOSIS — Z79621 Long term (current) use of calcineurin inhibitor: Secondary | ICD-10-CM | POA: Diagnosis not present

## 2023-07-11 DIAGNOSIS — I10 Essential (primary) hypertension: Secondary | ICD-10-CM | POA: Diagnosis not present

## 2023-07-11 DIAGNOSIS — R9401 Abnormal electroencephalogram [EEG]: Secondary | ICD-10-CM | POA: Diagnosis not present

## 2023-07-11 DIAGNOSIS — M25551 Pain in right hip: Secondary | ICD-10-CM | POA: Diagnosis not present

## 2023-07-11 DIAGNOSIS — D84821 Immunodeficiency due to drugs: Secondary | ICD-10-CM | POA: Diagnosis not present

## 2023-07-11 DIAGNOSIS — Z982 Presence of cerebrospinal fluid drainage device: Secondary | ICD-10-CM | POA: Diagnosis not present

## 2023-07-12 DIAGNOSIS — I6200 Nontraumatic subdural hemorrhage, unspecified: Secondary | ICD-10-CM | POA: Diagnosis not present

## 2023-07-12 DIAGNOSIS — S065XAA Traumatic subdural hemorrhage with loss of consciousness status unknown, initial encounter: Secondary | ICD-10-CM | POA: Diagnosis not present

## 2023-07-12 DIAGNOSIS — Z0389 Encounter for observation for other suspected diseases and conditions ruled out: Secondary | ICD-10-CM | POA: Diagnosis not present

## 2023-07-13 DIAGNOSIS — I6200 Nontraumatic subdural hemorrhage, unspecified: Secondary | ICD-10-CM | POA: Diagnosis not present

## 2023-07-13 DIAGNOSIS — I6203 Nontraumatic chronic subdural hemorrhage: Secondary | ICD-10-CM | POA: Diagnosis not present

## 2023-07-13 DIAGNOSIS — I6201 Nontraumatic acute subdural hemorrhage: Secondary | ICD-10-CM | POA: Diagnosis not present

## 2023-07-14 DIAGNOSIS — I6200 Nontraumatic subdural hemorrhage, unspecified: Secondary | ICD-10-CM | POA: Diagnosis not present

## 2023-07-16 DIAGNOSIS — Z79621 Long term (current) use of calcineurin inhibitor: Secondary | ICD-10-CM | POA: Diagnosis not present

## 2023-07-16 DIAGNOSIS — I6200 Nontraumatic subdural hemorrhage, unspecified: Secondary | ICD-10-CM | POA: Diagnosis not present

## 2023-07-16 DIAGNOSIS — Z940 Kidney transplant status: Secondary | ICD-10-CM | POA: Diagnosis not present

## 2023-07-16 DIAGNOSIS — D84821 Immunodeficiency due to drugs: Secondary | ICD-10-CM | POA: Diagnosis not present

## 2023-07-16 DIAGNOSIS — N189 Chronic kidney disease, unspecified: Secondary | ICD-10-CM | POA: Diagnosis not present

## 2023-07-16 DIAGNOSIS — I10 Essential (primary) hypertension: Secondary | ICD-10-CM | POA: Diagnosis not present

## 2023-07-17 DIAGNOSIS — I6200 Nontraumatic subdural hemorrhage, unspecified: Secondary | ICD-10-CM | POA: Diagnosis not present

## 2023-07-17 DIAGNOSIS — Z79621 Long term (current) use of calcineurin inhibitor: Secondary | ICD-10-CM | POA: Diagnosis not present

## 2023-07-17 DIAGNOSIS — N189 Chronic kidney disease, unspecified: Secondary | ICD-10-CM | POA: Diagnosis not present

## 2023-07-17 DIAGNOSIS — I10 Essential (primary) hypertension: Secondary | ICD-10-CM | POA: Diagnosis not present

## 2023-07-17 DIAGNOSIS — Z940 Kidney transplant status: Secondary | ICD-10-CM | POA: Diagnosis not present

## 2023-07-17 DIAGNOSIS — D84821 Immunodeficiency due to drugs: Secondary | ICD-10-CM | POA: Diagnosis not present

## 2023-07-18 DIAGNOSIS — I6200 Nontraumatic subdural hemorrhage, unspecified: Secondary | ICD-10-CM | POA: Diagnosis not present

## 2023-07-19 DIAGNOSIS — K5909 Other constipation: Secondary | ICD-10-CM | POA: Diagnosis not present

## 2023-07-19 DIAGNOSIS — F0390 Unspecified dementia without behavioral disturbance: Secondary | ICD-10-CM | POA: Diagnosis not present

## 2023-07-19 DIAGNOSIS — E43 Unspecified severe protein-calorie malnutrition: Secondary | ICD-10-CM | POA: Diagnosis not present

## 2023-07-19 DIAGNOSIS — F0150 Vascular dementia without behavioral disturbance: Secondary | ICD-10-CM | POA: Diagnosis not present

## 2023-07-19 DIAGNOSIS — R488 Other symbolic dysfunctions: Secondary | ICD-10-CM | POA: Diagnosis not present

## 2023-07-19 DIAGNOSIS — R262 Difficulty in walking, not elsewhere classified: Secondary | ICD-10-CM | POA: Diagnosis not present

## 2023-07-19 DIAGNOSIS — S065X0D Traumatic subdural hemorrhage without loss of consciousness, subsequent encounter: Secondary | ICD-10-CM | POA: Diagnosis not present

## 2023-07-19 DIAGNOSIS — I10 Essential (primary) hypertension: Secondary | ICD-10-CM | POA: Diagnosis not present

## 2023-07-19 DIAGNOSIS — N3281 Overactive bladder: Secondary | ICD-10-CM | POA: Diagnosis not present

## 2023-07-19 DIAGNOSIS — S065XAA Traumatic subdural hemorrhage with loss of consciousness status unknown, initial encounter: Secondary | ICD-10-CM | POA: Diagnosis not present

## 2023-07-19 DIAGNOSIS — M6281 Muscle weakness (generalized): Secondary | ICD-10-CM | POA: Diagnosis not present

## 2023-07-19 DIAGNOSIS — R4182 Altered mental status, unspecified: Secondary | ICD-10-CM | POA: Diagnosis not present

## 2023-07-19 DIAGNOSIS — I6200 Nontraumatic subdural hemorrhage, unspecified: Secondary | ICD-10-CM | POA: Diagnosis not present

## 2023-07-19 DIAGNOSIS — E785 Hyperlipidemia, unspecified: Secondary | ICD-10-CM | POA: Diagnosis not present

## 2023-07-19 DIAGNOSIS — I700 Atherosclerosis of aorta: Secondary | ICD-10-CM | POA: Diagnosis not present

## 2023-07-19 DIAGNOSIS — Z940 Kidney transplant status: Secondary | ICD-10-CM | POA: Diagnosis not present

## 2023-07-19 DIAGNOSIS — Z9181 History of falling: Secondary | ICD-10-CM | POA: Diagnosis not present

## 2023-07-19 DIAGNOSIS — F339 Major depressive disorder, recurrent, unspecified: Secondary | ICD-10-CM | POA: Diagnosis not present

## 2023-07-19 DIAGNOSIS — Z87440 Personal history of urinary (tract) infections: Secondary | ICD-10-CM | POA: Diagnosis not present

## 2023-07-20 ENCOUNTER — Non-Acute Institutional Stay (SKILLED_NURSING_FACILITY): Payer: Self-pay | Admitting: Adult Health

## 2023-07-20 ENCOUNTER — Encounter: Payer: Self-pay | Admitting: Adult Health

## 2023-07-20 DIAGNOSIS — I700 Atherosclerosis of aorta: Secondary | ICD-10-CM | POA: Diagnosis not present

## 2023-07-20 DIAGNOSIS — N3281 Overactive bladder: Secondary | ICD-10-CM

## 2023-07-20 DIAGNOSIS — F0150 Vascular dementia without behavioral disturbance: Secondary | ICD-10-CM

## 2023-07-20 DIAGNOSIS — I10 Essential (primary) hypertension: Secondary | ICD-10-CM

## 2023-07-20 DIAGNOSIS — S065XAA Traumatic subdural hemorrhage with loss of consciousness status unknown, initial encounter: Secondary | ICD-10-CM

## 2023-07-20 DIAGNOSIS — F339 Major depressive disorder, recurrent, unspecified: Secondary | ICD-10-CM

## 2023-07-20 DIAGNOSIS — E785 Hyperlipidemia, unspecified: Secondary | ICD-10-CM | POA: Diagnosis not present

## 2023-07-20 DIAGNOSIS — E43 Unspecified severe protein-calorie malnutrition: Secondary | ICD-10-CM | POA: Insufficient documentation

## 2023-07-20 DIAGNOSIS — Z940 Kidney transplant status: Secondary | ICD-10-CM | POA: Diagnosis not present

## 2023-07-20 DIAGNOSIS — K5909 Other constipation: Secondary | ICD-10-CM | POA: Diagnosis not present

## 2023-07-20 NOTE — Progress Notes (Signed)
Location:  [facility] Room Number: 132-P Place of Service:  SNF (31)   CODE STATUS: FULL CODE  No Known Allergies  Chief Complaint  Patient presents with   Hospitalization Follow-up    HPI:  She is a 82 year old woman who has been hospitalized from 07-10-23 through 07-19-23. Her medical history includes: dementia; renal transplant 2009; hypertension; hyperlipidemia and uti's. She was admitted to [facility] from OSH with altered mental status. Her ct scan demonstrated a holohemispheric right subdural hematoma with 2 cm mac thickness with 7 mm midline shift. A SEPS drain and subdural drain placement were attempted with 80 ml of chronic blood products removed. A embolization of right MMA with nBCA.  She recovered without complications. She is here for short term rehab with her goal to return back home. She will continue to be followed for her chronic illnesses including:      History of kidney transplant:    Primary hypertension: Chronic constipation:   Hyperlipidemia ldl goal <100  Past Medical History:  Diagnosis Date   Chronic kidney disease    had kidney transplant   Vascular dementia without behavioral disturbance (HCC)     Past Surgical History:  Procedure Laterality Date   CHOLECYSTECTOMY     COLONOSCOPY  08/28/2012   Procedure: COLONOSCOPY;  Surgeon: Malissa Hippo, MD;  [facility];  Service: Endoscopy;  Laterality: N/A;  930   COLONOSCOPY WITH PROPOFOL N/A 07/14/2020   Procedure: COLONOSCOPY WITH PROPOFOL;  Surgeon: Malissa Hippo, MD;  Location: [facility];  Service: Endoscopy;  Laterality: N/A;  930   HYSTEROSCOPY WITH D & C N/A 01/11/2021   Procedure: DILATATION AND CURETTAGE /HYSTEROSCOPY;  Surgeon: Vena Austria, MD;  Location: [facility] ORS;  Service: Gynecology;  Laterality: N/A;   KIDNEY TRANSPLANT     orif left wrist     TONSILLECTOMY      Social History   Socioeconomic History   Marital status: Married    Spouse name: Not on  file   Number of children: Not on file   Years of education: Not on file   Highest education level: Not on file  Occupational History   Not on file  Tobacco Use   Smoking status: Former   Smokeless tobacco: Never   Tobacco comments:    quit in 1977  Vaping Use   Vaping status: Never Used  Substance and Sexual Activity   Alcohol use: No   Drug use: No   Sexual activity: Yes    Birth control/protection: Post-menopausal  Other Topics Concern   Not on file  Social History Narrative   Not on file   Social Determinants of Health   Financial Resource Strain: Low Risk  (07/16/2023)   Received from [facility]   Overall Financial Resource Strain (CARDIA)    Difficulty of Paying Living Expenses: Not hard at all  Food Insecurity: No Food Insecurity (07/16/2023)   Received from [facility]   Hunger Vital Sign    Worried About Running Out of Food in the Last Year: Never true    Ran Out of Food in the Last Year: Never true  Transportation Needs: No Transportation Needs (07/16/2023)   Received from [facility]   PRAPARE - Transportation    Lack of Transportation (Medical): No    Lack of Transportation (Non-Medical): No  Physical Activity: Insufficiently Active (07/11/2022)   Exercise Vital Sign    Days of Exercise per Week: 2  days    Minutes of Exercise per Session: 20 min  Stress: No Stress Concern Present (07/11/2022)   [employer] of Occupational Health - Occupational Stress Questionnaire    Feeling of Stress : Not at all  Social Connections: Moderately Integrated (07/11/2022)   Social Connection and Isolation Panel [NHANES]    Frequency of Communication with Friends and Family: Once a week    Frequency of Social Gatherings with Friends and Family: Once a week    Attends Religious Services: More than 4 times per year    Active Member of [employer] or Organizations: No    Attends Banker Meetings: 1 to 4 times per year    Marital Status: Married  Careers information officer Violence: Not At Risk (07/27/2022)   Humiliation, Afraid, Rape, and Kick questionnaire    Fear of Current or Ex-Partner: No    Emotionally Abused: No    Physically Abused: No    Sexually Abused: No   History reviewed. No pertinent family history.    VITAL SIGNS BP 133/77   Pulse 60   Temp 98 F (36.7 C)   Resp 20   Ht 5\' 1"  (1.549 m)   SpO2 96%   BMI 21.73 kg/m   Outpatient Encounter Medications as of 07/20/2023  Medication Sig   acetaminophen (TYLENOL) 325 MG tablet Take 650 mg by mouth every 6 (six) hours as needed.   amLODipine (NORVASC) 2.5 MG tablet Take 2.5 mg by mouth at bedtime.    Cholecalciferol (VITAMIN D) 2000 UNITS CAPS Take 2,000 Units by mouth daily.    donepezil (ARICEPT) 10 MG tablet Take 10 mg by mouth daily.   melatonin 5 MG TABS Take 5 mg by mouth at bedtime.   mirtazapine (REMERON) 15 MG tablet Take 15 mg by mouth at bedtime.   mycophenolate (MYFORTIC) 180 MG EC tablet Take 540 mg by mouth 2 (two) times daily.   polyethylene glycol (MIRALAX / GLYCOLAX) 17 g packet Take 17 g by mouth daily.   senna (SENOKOT) 8.6 MG tablet Take 1 tablet by mouth at bedtime.   simvastatin (ZOCOR) 20 MG tablet Take 20 mg by mouth at bedtime.    solifenacin (VESICARE) 10 MG tablet Take 10 mg by mouth daily.   tacrolimus (PROGRAF) 0.5 MG capsule Take 0.5 mg by mouth 2 (two) times daily.    No facility-administered encounter medications on file as of 07/20/2023.     SIGNIFICANT DIAGNOSTIC EXAMS  TODAY  07-19-23: wbc 7.4; hgb 12.8; hct 38.4; mcv 86.5 plt 242; glucose 108; bun 11; creat 0.53; k+ 3.9; na++ 136; ca 9.7; gfr >90; phos 3.2 mag 1.5; albumin 2.8   Review of Systems  Constitutional:  Negative for malaise/fatigue.  Respiratory:  Negative for cough and shortness of breath.   Cardiovascular:  Negative for chest pain, palpitations and leg swelling.  Gastrointestinal:  Negative for abdominal pain, constipation and heartburn.  Musculoskeletal:  Negative for back  pain, joint pain and myalgias.  Skin: Negative.   Neurological:  Negative for dizziness.  Psychiatric/Behavioral:  The patient is not nervous/anxious.    Physical Exam Constitutional:      General: She is not in acute distress.    Appearance: She is underweight. She is not diaphoretic.  Neck:     Thyroid: No thyromegaly.  Cardiovascular:     Rate and Rhythm: Normal rate and regular rhythm.     Pulses: Normal pulses.     Heart sounds: Normal heart sounds.  Pulmonary:  Effort: Pulmonary effort is normal. No respiratory distress.     Breath sounds: Normal breath sounds.  Abdominal:     General: Bowel sounds are normal. There is no distension.     Palpations: Abdomen is soft.     Tenderness: There is no abdominal tenderness.  Musculoskeletal:     Cervical back: Neck supple.     Right lower leg: No edema.     Left lower leg: No edema.  Lymphadenopathy:     Cervical: No cervical adenopathy.  Skin:    General: Skin is warm and dry.  Neurological:     Mental Status: She is alert. Mental status is at baseline.  Psychiatric:        Mood and Affect: Mood normal.       ASSESSMENT/ PLAN:  TODAY  Subdural hematoma: is status post surgical intervention. Will continue therapy as directed to improve upon level of independence with her adls. Will follow up with neurosurgery as indicated.  2. History of kidney transplant: 2009: will continue myfortic 540 mg twice daily and prograf 0.5 mg twice daily   3. Primary hypertension: b/p 133/77: will continue norvasc 2.5 mg daily   4. Chronic constipation: will continue senna and miralax daily   5. Hyperlipidemia ldl goal <100: will continue zocor 20 mg daily   6. Aortic atherosclerosis (cxr 06-20-22)is on statin  7. Major depression recurrent chronic: will continue remeron 15 mg nightly   8. 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 Severe protein calorie malnutrition: albumin 2.8 will begin prosource 30 mL three times daily   10. OAB: will continue vesicare 10 mg daily   Will repeat mag; bmp    Synthia Innocent NP [facility] Adult [facility]  call [phone]

## 2023-07-21 ENCOUNTER — Encounter: Payer: Self-pay | Admitting: Internal Medicine

## 2023-07-21 ENCOUNTER — Non-Acute Institutional Stay (SKILLED_NURSING_FACILITY): Payer: Self-pay | Admitting: Internal Medicine

## 2023-07-21 DIAGNOSIS — S065XAA Traumatic subdural hemorrhage with loss of consciousness status unknown, initial encounter: Secondary | ICD-10-CM | POA: Diagnosis not present

## 2023-07-21 DIAGNOSIS — E43 Unspecified severe protein-calorie malnutrition: Secondary | ICD-10-CM | POA: Diagnosis not present

## 2023-07-21 DIAGNOSIS — F0150 Vascular dementia without behavioral disturbance: Secondary | ICD-10-CM

## 2023-07-21 NOTE — Assessment & Plan Note (Addendum)
Limb atrophy present. Albumin 3.5 & total protein 7.1. Nutrition to consult @ SNF.

## 2023-07-21 NOTE — Progress Notes (Signed)
Marland Kitchen   NURSING HOME LOCATION:  [facility] ROOM NUMBER:  132 P  CODE STATUS:  Full Code  PCP:  Carylon Perches MD  This is a comprehensive admission note to this SNFperformed on this date less than 30 days from date of admission. Included are preadmission medical/surgical history; reconciled medication list; family history; social history and comprehensive review of systems.  Corrections and additions to the records were documented. Comprehensive physical exam was also performed. Additionally a clinical summary was entered for each active diagnosis pertinent to this admission in the Problem List to enhance continuity of care.  HPI: She was hospitalized 9/17 - 07/19/2023 @ [facility] with a subdural hematoma.  She presented with acute mental status change and difficulty ambulating.  CT of the head revealed a holohemispheric right subdural hematoma with 2 cm maximum thickness and 7 mm midline shift.  SFPS drain and subdural drain placement were attempted with 80 mL of chronic blood products removed.  She was admitted to the NSICU for monitoring.  9/20 right MMA embolization was performed.  She improved serially and was transferred to regular floor with advancement in her diet.  Pain was controlled with p.o. medications.   Creatinine was 0.53; eGFR was >60.Albumin was 3.5 and total protein 7.1. At the time of discharge H/H was 12.8/38.4 with normal  WBC & platelet count. PT/OT consulted and recommended placement in SNF for rehab  Past medical and surgical history: includes vascular dementia ,CKD, OAB, hx of colon polyp & severe protein/ caloric malnutrition., Surgeries & procedures include cholecystectomy,TAH,colonoscopy & renal transplant.  Family history: non contributory due to advanced age.  Social history:non drinker; former smoker   Review of systems could not be completed due to severe dementia.  She was virtually nonverbal and had difficulty following commands.  Her son who is  present validated that she has been diagnosed with vascular dementia.  When I asked her his date of birth she gave me the month and day of her birth but not the year.  When I asked her to hold up 2 fingers on the left hand she held up 2 fingers on the right.  Physical exam:  Pertinent or positive findings: She is frail and appears her stated age.  An operative scar is present over the right lateral scalp.  Facies tend to be blank.  There is a suggestion of anisocoria with the left pupil slightly larger than the right.  Oral exam could not be completed as she would not open her mouth for such.  Grade 2 systolic murmur was noted at the base.  There is a left carotid bruit versus radiation of the murmur.  Pedal pulses are decreased.  She exhibits intermittent tremor of the right hand.  Limb atrophy is present.  General appearance: no acute distress, increased work of breathing is present.   Lymphatic: No lymphadenopathy about the head, neck, axilla. Eyes: No conjunctival inflammation or lid edema is present. There is no scleral icterus. Ears:  External ear exam shows no significant lesions or deformities.   Nose:  External nasal examination shows no deformity or inflammation. Nasal mucosa are pink and moist without lesions, exudates Oral exam: Lips and gums are healthy appearing.There is no oropharyngeal erythema or exudate. Neck:  No thyromegaly, masses, tenderness noted.    Heart:  Normal rate and regular rhythm. S1 and S2 normal without gallop, click, rub.  Lungs: Chest clear to auscultation without wheezes, rhonchi, rales, rubs. Abdomen: Bowel sounds are normal.  Abdomen is soft and nontender with no organomegaly, hernias, masses. GU: Deferred  Extremities:  No cyanosis, clubbing, edema. Neurologic exam: Balance, Rhomberg, finger to nose testing could not be completed due to clinical state Skin: Warm & dry w/o tenting. No significant lesions or rash.  See clinical summary under each active  problem in the Problem List with associated updated therapeutic plan

## 2023-07-21 NOTE — Assessment & Plan Note (Signed)
PT/OT at SNF as tolerated. ?

## 2023-07-21 NOTE — Assessment & Plan Note (Signed)
She can provide no history and has great difficulty following commands.  No behavioral issues reported.

## 2023-07-21 NOTE — Patient Instructions (Signed)
See assessment and plan under each diagnosis in the problem list and acutely for this visit 

## 2023-08-01 ENCOUNTER — Encounter: Payer: Self-pay | Admitting: Adult Health

## 2023-08-01 ENCOUNTER — Other Ambulatory Visit: Payer: Self-pay | Admitting: Adult Health

## 2023-08-01 ENCOUNTER — Non-Acute Institutional Stay (SKILLED_NURSING_FACILITY): Payer: PPO | Admitting: Adult Health

## 2023-08-01 DIAGNOSIS — S065XAA Traumatic subdural hemorrhage with loss of consciousness status unknown, initial encounter: Secondary | ICD-10-CM

## 2023-08-01 DIAGNOSIS — E43 Unspecified severe protein-calorie malnutrition: Secondary | ICD-10-CM | POA: Diagnosis not present

## 2023-08-01 DIAGNOSIS — I700 Atherosclerosis of aorta: Secondary | ICD-10-CM | POA: Diagnosis not present

## 2023-08-01 MED ORDER — AMLODIPINE BESYLATE 2.5 MG PO TABS: 2.5000 mg | ORAL_TABLET | Freq: Every day | ORAL | 0 refills | Status: AC

## 2023-08-01 MED ORDER — SOLIFENACIN SUCCINATE 10 MG PO TABS: 10.0000 mg | ORAL_TABLET | Freq: Every day | ORAL | 0 refills | Status: DC

## 2023-08-01 MED ORDER — MYCOPHENOLATE SODIUM 180 MG PO TBEC: 540.0000 mg | DELAYED_RELEASE_TABLET | Freq: Two times a day (BID) | ORAL | 0 refills | Status: AC

## 2023-08-01 MED ORDER — SIMVASTATIN 20 MG PO TABS: 20.0000 mg | ORAL_TABLET | Freq: Every day | ORAL | 0 refills | Status: AC

## 2023-08-01 MED ORDER — DONEPEZIL HCL 10 MG PO TABS: 10.0000 mg | ORAL_TABLET | Freq: Every day | ORAL | 0 refills | Status: AC

## 2023-08-01 MED ORDER — TACROLIMUS 0.5 MG PO CAPS: 0.5000 mg | ORAL_CAPSULE | Freq: Two times a day (BID) | ORAL | 0 refills | Status: AC

## 2023-08-01 MED ORDER — MIRTAZAPINE 15 MG PO TABS: 15.0000 mg | ORAL_TABLET | Freq: Every day | ORAL | 0 refills | Status: DC

## 2023-08-01 NOTE — Progress Notes (Signed)
Location:  [facility] Room Number: 132-P Place of Service:  SNF (31)   CODE STATUS: FULL CODE  No Known Allergies  Chief Complaint  Patient presents with   Discharge Note    HPI:  She is being discharged to home with home health for pt/ot. She does not need any dme. Sje il need her prescriptions written and will need to follow up with her medical provider. She had been hospitalized for subdural hematoma: is status post surgical intervention. She was admitted to this facility for short term rehab. Therapy: ambulate 125 feet with rolling walker and contact guard assist. Upper body supervision; lower body: min assist. Steps bilateral rails with contact guard BRP min assist. BCAT 16/50.   Past Medical History:  Diagnosis Date   Chronic kidney disease    had kidney transplant   Vascular dementia without behavioral disturbance [facility])     Past Surgical History:  Procedure Laterality Date   CHOLECYSTECTOMY     COLONOSCOPY  08/28/2012   Procedure: COLONOSCOPY;  Surgeon: Malissa Hippo, MD;  [facility];  Service: Endoscopy;  Laterality: N/A;  930   COLONOSCOPY WITH PROPOFOL N/A 07/14/2020   Procedure: COLONOSCOPY WITH PROPOFOL;  Surgeon: Malissa Hippo, MD;  [facility]: [facility];  Service: Endoscopy;  Laterality: N/A;  930   HYSTEROSCOPY WITH D & C N/A 01/11/2021   Procedure: DILATATION AND CURETTAGE /HYSTEROSCOPY;  Surgeon: Vena Austria, MD;  Location: [facility] ORS;  Service: Gynecology;  Laterality: N/A;   KIDNEY TRANSPLANT     orif left wrist     TONSILLECTOMY      Social History   Socioeconomic History   Marital status: Married    Spouse name: Not on file   Number of children: Not on file   Years of education: Not on file   Highest education level: Not on file  Occupational History   Not on file  Tobacco Use   Smoking status: Former   Smokeless tobacco: Never   Tobacco comments:    quit in 1977  Vaping Use   Vaping status:  Never Used  Substance and Sexual Activity   Alcohol use: No   Drug use: No   Sexual activity: Yes    Birth control/protection: Post-menopausal  Other Topics Concern   Not on file  Social History Narrative   Not on file   Social Determinants of Health   Financial Resource Strain: Low Risk  (07/16/2023)   Received from [facility]   Overall Financial Resource Strain (CARDIA)    Difficulty of Paying Living Expenses: Not hard at all  Food Insecurity: No Food Insecurity (07/16/2023)   Received from [facility]   Hunger Vital Sign    Worried About Running Out of Food in the Last Year: Never true    Ran Out of Food in the Last Year: Never true  Transportation Needs: No Transportation Needs (07/16/2023)   Received from [facility]   PRAPARE - Transportation    Lack of Transportation (Medical): No    Lack of Transportation (Non-Medical): No  Physical Activity: Insufficiently Active (07/11/2022)   Exercise Vital Sign    Days of Exercise per Week: 2 days    Minutes of Exercise per Session: 20 min  Stress: No Stress Concern Present (07/11/2022)   [employer] of Occupational Health - Occupational Stress Questionnaire    Feeling of Stress : Not at all  Social Connections: Moderately Integrated (07/11/2022)  Social Advertising account executive [NHANES]    Frequency of Communication with Friends and Family: Once a week    Frequency of Social Gatherings with Friends and Family: Once a week    Attends Religious Services: More than 4 times per year    Active Member of [employer] or Organizations: No    Attends Banker Meetings: 1 to 4 times per year    Marital Status: Married  Catering manager Violence: Not At Risk (07/27/2022)   Humiliation, Afraid, Rape, and Kick questionnaire    Fear of Current or Ex-Partner: No    Emotionally Abused: No    Physically Abused: No    Sexually Abused: No   History reviewed. No pertinent family history.    VITAL SIGNS BP 133/77    Pulse 60   Temp 98 F (36.7 C)   Resp 20   Ht 5\' 1"  (1.549 m)   SpO2 96%   BMI 21.73 kg/m   Outpatient Encounter Medications as of 07/20/2023  Medication Sig   acetaminophen (TYLENOL) 325 MG tablet Take 650 mg by mouth every 6 (six) hours as needed.   amLODipine (NORVASC) 2.5 MG tablet Take 2.5 mg by mouth at bedtime.    Cholecalciferol (VITAMIN D) 2000 UNITS CAPS Take 2,000 Units by mouth daily.    donepezil (ARICEPT) 10 MG tablet Take 10 mg by mouth daily.   melatonin 5 MG TABS Take 5 mg by mouth at bedtime.   mirtazapine (REMERON) 15 MG tablet Take 15 mg by mouth at bedtime.   mycophenolate (MYFORTIC) 180 MG EC tablet Take 540 mg by mouth 2 (two) times daily.   polyethylene glycol (MIRALAX / GLYCOLAX) 17 g packet Take 17 g by mouth daily.   senna (SENOKOT) 8.6 MG tablet Take 1 tablet by mouth at bedtime.   simvastatin (ZOCOR) 20 MG tablet Take 20 mg by mouth at bedtime.    solifenacin (VESICARE) 10 MG tablet Take 10 mg by mouth daily.   tacrolimus (PROGRAF) 0.5 MG capsule Take 0.5 mg by mouth 2 (two) times daily.    No facility-administered encounter medications on file as of 07/20/2023.     SIGNIFICANT DIAGNOSTIC EXAMS  TODAY  07-19-23: wbc 7.4; hgb 12.8; hct 38.4; mcv 86.5 plt 242; glucose 108; bun 11; creat 0.53; k+ 3.9; na++ 136; ca 9.7; gfr >90; phos 3.2 mag 1.5; albumin 2.8    Assessment/Plan:    Patient is being discharged with the following home health services:  pt/ot to evaluate and treat as indicated for gait balance strength adl training.   Patient is being discharged with the following durable medical equipment:  none needed   Patient has been advised to f/u with their PCP in 1-2 weeks to for a transitions of care visit.  Social services at their facility was responsible for arranging this appointment.  Pt was provided with adequate prescriptions of noncontrolled medications to reach the scheduled appointment .  For controlled substances, a limited supply was  provided as appropriate for the individual patient.  If the pt normally receives these medications from a pain clinic or has a contract with another physician, these medications should be received from that clinic or physician only).    A 30 day supply of her prescription medications have been sent to: [facility]    Time spent with patient 40 minutes: home health; dme; medications   Synthia Innocent NP [facility] Adult Medicine  call [phone]

## 2023-08-06 DIAGNOSIS — F0153 Vascular dementia, unspecified severity, with mood disturbance: Secondary | ICD-10-CM | POA: Diagnosis not present

## 2023-08-06 DIAGNOSIS — N3281 Overactive bladder: Secondary | ICD-10-CM | POA: Diagnosis not present

## 2023-08-06 DIAGNOSIS — Z940 Kidney transplant status: Secondary | ICD-10-CM | POA: Diagnosis not present

## 2023-08-06 DIAGNOSIS — N189 Chronic kidney disease, unspecified: Secondary | ICD-10-CM | POA: Diagnosis not present

## 2023-08-06 DIAGNOSIS — Z9181 History of falling: Secondary | ICD-10-CM | POA: Diagnosis not present

## 2023-08-06 DIAGNOSIS — S065XAD Traumatic subdural hemorrhage with loss of consciousness status unknown, subsequent encounter: Secondary | ICD-10-CM | POA: Diagnosis not present

## 2023-08-06 DIAGNOSIS — I129 Hypertensive chronic kidney disease with stage 1 through stage 4 chronic kidney disease, or unspecified chronic kidney disease: Secondary | ICD-10-CM | POA: Diagnosis not present

## 2023-08-06 DIAGNOSIS — Z87440 Personal history of urinary (tract) infections: Secondary | ICD-10-CM | POA: Diagnosis not present

## 2023-08-06 DIAGNOSIS — E785 Hyperlipidemia, unspecified: Secondary | ICD-10-CM | POA: Diagnosis not present

## 2023-08-06 DIAGNOSIS — I700 Atherosclerosis of aorta: Secondary | ICD-10-CM | POA: Diagnosis not present

## 2023-08-06 DIAGNOSIS — Z87891 Personal history of nicotine dependence: Secondary | ICD-10-CM | POA: Diagnosis not present

## 2023-08-06 DIAGNOSIS — E43 Unspecified severe protein-calorie malnutrition: Secondary | ICD-10-CM | POA: Diagnosis not present

## 2023-08-06 DIAGNOSIS — F339 Major depressive disorder, recurrent, unspecified: Secondary | ICD-10-CM | POA: Diagnosis not present

## 2023-08-07 DIAGNOSIS — N189 Chronic kidney disease, unspecified: Secondary | ICD-10-CM | POA: Diagnosis not present

## 2023-08-07 DIAGNOSIS — Z940 Kidney transplant status: Secondary | ICD-10-CM | POA: Diagnosis not present

## 2023-08-07 DIAGNOSIS — Z8679 Personal history of other diseases of the circulatory system: Secondary | ICD-10-CM | POA: Diagnosis not present

## 2023-08-07 DIAGNOSIS — I517 Cardiomegaly: Secondary | ICD-10-CM | POA: Diagnosis not present

## 2023-08-07 DIAGNOSIS — I771 Stricture of artery: Secondary | ICD-10-CM | POA: Diagnosis not present

## 2023-08-07 DIAGNOSIS — Z905 Acquired absence of kidney: Secondary | ICD-10-CM | POA: Diagnosis not present

## 2023-08-07 DIAGNOSIS — I129 Hypertensive chronic kidney disease with stage 1 through stage 4 chronic kidney disease, or unspecified chronic kidney disease: Secondary | ICD-10-CM | POA: Diagnosis not present

## 2023-08-07 DIAGNOSIS — G928 Other toxic encephalopathy: Secondary | ICD-10-CM | POA: Diagnosis not present

## 2023-08-07 DIAGNOSIS — Z87891 Personal history of nicotine dependence: Secondary | ICD-10-CM | POA: Diagnosis not present

## 2023-08-07 DIAGNOSIS — I6523 Occlusion and stenosis of bilateral carotid arteries: Secondary | ICD-10-CM | POA: Diagnosis not present

## 2023-08-07 DIAGNOSIS — R509 Fever, unspecified: Secondary | ICD-10-CM | POA: Diagnosis not present

## 2023-08-07 DIAGNOSIS — R4182 Altered mental status, unspecified: Secondary | ICD-10-CM | POA: Diagnosis not present

## 2023-08-07 DIAGNOSIS — Z20822 Contact with and (suspected) exposure to covid-19: Secondary | ICD-10-CM | POA: Diagnosis not present

## 2023-08-07 DIAGNOSIS — R059 Cough, unspecified: Secondary | ICD-10-CM | POA: Diagnosis not present

## 2023-08-07 DIAGNOSIS — R531 Weakness: Secondary | ICD-10-CM | POA: Diagnosis not present

## 2023-08-07 DIAGNOSIS — R051 Acute cough: Secondary | ICD-10-CM | POA: Diagnosis not present

## 2023-08-07 DIAGNOSIS — Z79899 Other long term (current) drug therapy: Secondary | ICD-10-CM | POA: Diagnosis not present

## 2023-08-08 DIAGNOSIS — G9340 Encephalopathy, unspecified: Secondary | ICD-10-CM | POA: Diagnosis not present

## 2023-08-08 DIAGNOSIS — R4182 Altered mental status, unspecified: Secondary | ICD-10-CM | POA: Diagnosis not present

## 2023-08-08 DIAGNOSIS — R531 Weakness: Secondary | ICD-10-CM | POA: Diagnosis not present

## 2023-08-08 DIAGNOSIS — Z87440 Personal history of urinary (tract) infections: Secondary | ICD-10-CM | POA: Diagnosis not present

## 2023-08-08 DIAGNOSIS — I6200 Nontraumatic subdural hemorrhage, unspecified: Secondary | ICD-10-CM | POA: Diagnosis not present

## 2023-08-08 DIAGNOSIS — Z9181 History of falling: Secondary | ICD-10-CM | POA: Diagnosis not present

## 2023-08-08 DIAGNOSIS — Z87820 Personal history of traumatic brain injury: Secondary | ICD-10-CM | POA: Diagnosis not present

## 2023-08-08 DIAGNOSIS — N3281 Overactive bladder: Secondary | ICD-10-CM | POA: Diagnosis not present

## 2023-08-08 DIAGNOSIS — I10 Essential (primary) hypertension: Secondary | ICD-10-CM | POA: Diagnosis not present

## 2023-08-08 DIAGNOSIS — G038 Meningitis due to other specified causes: Secondary | ICD-10-CM | POA: Diagnosis not present

## 2023-08-08 DIAGNOSIS — Z79899 Other long term (current) drug therapy: Secondary | ICD-10-CM | POA: Diagnosis not present

## 2023-08-08 DIAGNOSIS — G039 Meningitis, unspecified: Secondary | ICD-10-CM | POA: Diagnosis not present

## 2023-08-08 DIAGNOSIS — R400 Somnolence: Secondary | ICD-10-CM | POA: Diagnosis not present

## 2023-08-08 DIAGNOSIS — R918 Other nonspecific abnormal finding of lung field: Secondary | ICD-10-CM | POA: Diagnosis not present

## 2023-08-08 DIAGNOSIS — F0150 Vascular dementia without behavioral disturbance: Secondary | ICD-10-CM | POA: Diagnosis not present

## 2023-08-08 DIAGNOSIS — I6789 Other cerebrovascular disease: Secondary | ICD-10-CM | POA: Diagnosis not present

## 2023-08-08 DIAGNOSIS — R001 Bradycardia, unspecified: Secondary | ICD-10-CM | POA: Diagnosis not present

## 2023-08-08 DIAGNOSIS — R9401 Abnormal electroencephalogram [EEG]: Secondary | ICD-10-CM | POA: Diagnosis not present

## 2023-08-08 DIAGNOSIS — D84821 Immunodeficiency due to drugs: Secondary | ICD-10-CM | POA: Diagnosis not present

## 2023-08-08 DIAGNOSIS — Z940 Kidney transplant status: Secondary | ICD-10-CM | POA: Diagnosis not present

## 2023-08-08 DIAGNOSIS — G928 Other toxic encephalopathy: Secondary | ICD-10-CM | POA: Diagnosis not present

## 2023-08-08 DIAGNOSIS — G9389 Other specified disorders of brain: Secondary | ICD-10-CM | POA: Diagnosis not present

## 2023-08-08 DIAGNOSIS — E785 Hyperlipidemia, unspecified: Secondary | ICD-10-CM | POA: Diagnosis not present

## 2023-08-08 DIAGNOSIS — I517 Cardiomegaly: Secondary | ICD-10-CM | POA: Diagnosis not present

## 2023-08-09 DIAGNOSIS — G928 Other toxic encephalopathy: Secondary | ICD-10-CM | POA: Diagnosis not present

## 2023-08-09 DIAGNOSIS — R9401 Abnormal electroencephalogram [EEG]: Secondary | ICD-10-CM | POA: Diagnosis not present

## 2023-08-09 DIAGNOSIS — R4182 Altered mental status, unspecified: Secondary | ICD-10-CM | POA: Diagnosis not present

## 2023-08-09 DIAGNOSIS — G9389 Other specified disorders of brain: Secondary | ICD-10-CM | POA: Diagnosis not present

## 2023-08-09 DIAGNOSIS — Z87820 Personal history of traumatic brain injury: Secondary | ICD-10-CM | POA: Diagnosis not present

## 2023-08-10 DIAGNOSIS — G928 Other toxic encephalopathy: Secondary | ICD-10-CM | POA: Diagnosis not present

## 2023-08-11 DIAGNOSIS — G928 Other toxic encephalopathy: Secondary | ICD-10-CM | POA: Diagnosis not present

## 2023-08-12 DIAGNOSIS — G928 Other toxic encephalopathy: Secondary | ICD-10-CM | POA: Diagnosis not present

## 2023-08-13 DIAGNOSIS — G039 Meningitis, unspecified: Secondary | ICD-10-CM | POA: Diagnosis not present

## 2023-08-13 DIAGNOSIS — G9340 Encephalopathy, unspecified: Secondary | ICD-10-CM | POA: Diagnosis not present

## 2023-08-14 DIAGNOSIS — G9340 Encephalopathy, unspecified: Secondary | ICD-10-CM | POA: Diagnosis not present

## 2023-08-14 DIAGNOSIS — G039 Meningitis, unspecified: Secondary | ICD-10-CM | POA: Diagnosis not present

## 2023-08-20 DIAGNOSIS — I10 Essential (primary) hypertension: Secondary | ICD-10-CM | POA: Diagnosis not present

## 2023-08-20 DIAGNOSIS — S065X0A Traumatic subdural hemorrhage without loss of consciousness, initial encounter: Secondary | ICD-10-CM | POA: Diagnosis not present

## 2023-08-20 DIAGNOSIS — R011 Cardiac murmur, unspecified: Secondary | ICD-10-CM | POA: Diagnosis not present

## 2023-08-21 ENCOUNTER — Other Ambulatory Visit (HOSPITAL_COMMUNITY): Payer: Self-pay | Admitting: Internal Medicine

## 2023-08-21 DIAGNOSIS — I6200 Nontraumatic subdural hemorrhage, unspecified: Secondary | ICD-10-CM | POA: Diagnosis not present

## 2023-08-21 DIAGNOSIS — Z87891 Personal history of nicotine dependence: Secondary | ICD-10-CM | POA: Diagnosis not present

## 2023-08-21 DIAGNOSIS — S065XAA Traumatic subdural hemorrhage with loss of consciousness status unknown, initial encounter: Secondary | ICD-10-CM | POA: Diagnosis not present

## 2023-08-21 DIAGNOSIS — Z79899 Other long term (current) drug therapy: Secondary | ICD-10-CM | POA: Diagnosis not present

## 2023-08-21 DIAGNOSIS — R011 Cardiac murmur, unspecified: Secondary | ICD-10-CM

## 2023-08-22 DIAGNOSIS — F0390 Unspecified dementia without behavioral disturbance: Secondary | ICD-10-CM | POA: Diagnosis not present

## 2023-08-22 DIAGNOSIS — I10 Essential (primary) hypertension: Secondary | ICD-10-CM | POA: Diagnosis not present

## 2023-08-22 DIAGNOSIS — Z9049 Acquired absence of other specified parts of digestive tract: Secondary | ICD-10-CM | POA: Diagnosis not present

## 2023-08-22 DIAGNOSIS — F0150 Vascular dementia without behavioral disturbance: Secondary | ICD-10-CM | POA: Diagnosis not present

## 2023-08-22 DIAGNOSIS — G8929 Other chronic pain: Secondary | ICD-10-CM | POA: Diagnosis not present

## 2023-08-22 DIAGNOSIS — Z87820 Personal history of traumatic brain injury: Secondary | ICD-10-CM | POA: Diagnosis not present

## 2023-08-22 DIAGNOSIS — F05 Delirium due to known physiological condition: Secondary | ICD-10-CM | POA: Diagnosis not present

## 2023-08-22 DIAGNOSIS — Z79899 Other long term (current) drug therapy: Secondary | ICD-10-CM | POA: Diagnosis not present

## 2023-08-22 DIAGNOSIS — Z7409 Other reduced mobility: Secondary | ICD-10-CM | POA: Diagnosis not present

## 2023-08-22 DIAGNOSIS — E785 Hyperlipidemia, unspecified: Secondary | ICD-10-CM | POA: Diagnosis not present

## 2023-08-22 DIAGNOSIS — Z8616 Personal history of COVID-19: Secondary | ICD-10-CM | POA: Diagnosis not present

## 2023-08-22 DIAGNOSIS — S065XAD Traumatic subdural hemorrhage with loss of consciousness status unknown, subsequent encounter: Secondary | ICD-10-CM | POA: Diagnosis not present

## 2023-08-22 DIAGNOSIS — M25569 Pain in unspecified knee: Secondary | ICD-10-CM | POA: Diagnosis not present

## 2023-08-22 DIAGNOSIS — Z940 Kidney transplant status: Secondary | ICD-10-CM | POA: Diagnosis not present

## 2023-08-22 DIAGNOSIS — G319 Degenerative disease of nervous system, unspecified: Secondary | ICD-10-CM | POA: Diagnosis not present

## 2023-08-28 DIAGNOSIS — B351 Tinea unguium: Secondary | ICD-10-CM | POA: Diagnosis not present

## 2023-08-28 DIAGNOSIS — I739 Peripheral vascular disease, unspecified: Secondary | ICD-10-CM | POA: Diagnosis not present

## 2023-08-30 ENCOUNTER — Ambulatory Visit
Admission: EM | Admit: 2023-08-30 | Discharge: 2023-08-30 | Disposition: A | Discharge status: Home / Self Care | Payer: PPO | Attending: Family Medicine | Admitting: Family Medicine

## 2023-08-30 DIAGNOSIS — R300 Dysuria: Secondary | ICD-10-CM | POA: Diagnosis not present

## 2023-08-30 LAB — POCT URINALYSIS DIP (MANUAL ENTRY)
Bilirubin, UA: NEGATIVE
Blood, UA: NEGATIVE
Glucose, UA: NEGATIVE mg/dL
Ketones, POC UA: NEGATIVE mg/dL
Leukocytes, UA: NEGATIVE
Nitrite, UA: NEGATIVE
Protein Ur, POC: NEGATIVE mg/dL
Spec Grav, UA: 1.015 (ref 1.010–1.025)
Urobilinogen, UA: 1 U/dL
pH, UA: 7.5 (ref 5.0–8.0)

## 2023-08-30 NOTE — ED Provider Notes (Signed)
[facility]    CSN: [id number] Arrival date & time: 08/30/23  1823      History   Chief Complaint Chief Complaint  Patient presents with   Urinary Frequency    HPI CARY WILFORD is a 82 y.o. female.   Presenting today with 1 day history of urinary frequency, dysuria.  Denies fever, chills, abdominal pain, nausea vomiting diarrhea, flank pain.  So far not trying anything over-the-counter for symptoms.  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    Past Medical History:  Diagnosis Date   Chronic kidney disease    had kidney transplant   Vascular dementia without behavioral disturbance [facility])     Patient Active Problem List   Diagnosis Date Noted   Chronic constipation 07/20/2023   Aortic atherosclerosis (HCC) 07/20/2023   Major depression, recurrent, chronic (HCC) 07/20/2023   Severe protein-calorie malnutrition (HCC) 07/20/2023   OAB (overactive bladder) 07/20/2023   Subdural hematoma (HCC) 07/10/2023   Hyperlipidemia LDL goal <100 07/10/2023   Dementia (HCC) 07/10/2023   Fall at home 07/10/2023   Hyperglycemia 07/10/2023   Pressure injury of skin 07/28/2022   Acute metabolic encephalopathy 07/27/2022   Severe sepsis (HCC) 07/27/2022   HTN (hypertension) 07/27/2022   Encounter for screening fecal occult blood testing 07/11/2022   Encounter for well woman exam with routine gynecological exam 07/11/2022   Loss of weight 09/05/2021   Generalized abdominal pain 09/05/2021   Vaginal atrophy 03/22/2021   Symptoms of urinary tract infection 03/22/2021   History of kidney transplant 03/22/2021   Endometrial hyperplasia without atypia 01/19/2021   PMB (postmenopausal bleeding)    Abnormal endometrial ultrasound    Cervical stenosis (uterine cervix)    Tubular adenoma 07/22/2012   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    Past Surgical History:  Procedure Laterality Date   BRAIN SURGERY  2024   CHOLECYSTECTOMY      COLONOSCOPY  08/28/2012   Procedure: COLONOSCOPY;  Surgeon: Malissa Hippo, MD;  [facility];  Service: Endoscopy;  Laterality: N/A;  930   COLONOSCOPY WITH PROPOFOL N/A 07/14/2020   Procedure: COLONOSCOPY WITH PROPOFOL;  Surgeon: Malissa Hippo, MD;  [facility]: [facility];  Service: Endoscopy;  Laterality: N/A;  930   HYSTEROSCOPY WITH D & C N/A 01/11/2021   Procedure: DILATATION AND CURETTAGE /HYSTEROSCOPY;  Surgeon: Vena Austria, MD;  Location: [facility];  Service: Gynecology;  Laterality: N/A;   KIDNEY TRANSPLANT     orif left wrist     TONSILLECTOMY      OB History     Gravida  2   Para  2   Term  2   Preterm      AB      Living  2      SAB      IAB      Ectopic      Multiple      Live Births  2            Home Medications    Prior to Admission medications   Medication Sig Start Date End Date Taking? Authorizing Provider  acetaminophen (TYLENOL) 325 MG tablet Take 650 mg by mouth every 6 (six) hours as needed.    [provider]  amLODipine (NORVASC) 2.5 MG tablet Take 1 tablet (2.5 mg total) by mouth at bedtime. 08/01/23   Sharee Holster, NP  Cholecalciferol (VITAMIN D) 2000 UNITS CAPS Take 2,000 Units by mouth daily.  [provider]  donepezil (ARICEPT) 10 MG tablet Take 1 tablet (10 mg total) by mouth daily. 08/01/23   Sharee Holster, NP  melatonin 5 MG TABS Take 5 mg by mouth at bedtime.    [provider]  mirtazapine (REMERON) 15 MG tablet Take 1 tablet (15 mg total) by mouth at bedtime. 08/01/23   Sharee Holster, NP  mycophenolate (MYFORTIC) 180 MG EC tablet Take 3 tablets (540 mg total) by mouth 2 (two) times daily. 08/01/23   Sharee Holster, NP  polyethylene glycol (MIRALAX / GLYCOLAX) 17 g packet Take 17 g by mouth daily.    [provider]  Protein (PROSOURCE PO) Take 30 mLs by mouth 3 (three) times daily.    [provider]  senna (SENOKOT) 8.6 MG tablet Take 1 tablet by  mouth 2 (two) times daily.    [provider]  simvastatin (ZOCOR) 20 MG tablet Take 1 tablet (20 mg total) by mouth at bedtime. 08/01/23   Sharee Holster, NP  solifenacin (VESICARE) 10 MG tablet Take 1 tablet (10 mg total) by mouth daily. 08/01/23   Sharee Holster, NP  tacrolimus (PROGRAF) 0.5 MG capsule Take 1 capsule (0.5 mg total) by mouth 2 (two) times daily. 08/01/23   Sharee Holster, NP  zinc oxide 20 % ointment Apply 1 Application topically as needed for irritation.    [provider]    Family History History reviewed. No pertinent family history.  Social History Social History   Tobacco Use   Smoking status: Former   Smokeless tobacco: Never   Tobacco comments:    quit in 1977  Vaping Use   Vaping status: Never Used  Substance Use Topics   Alcohol use: No   Drug use: No     Allergies   Patient has no known allergies.   Review of Systems Review of Systems Per HPI  Physical Exam Triage Vital Signs ED Triage Vitals  Encounter Vitals Group     BP 08/30/23 1855 (!) 153/80     Systolic BP Percentile --      Diastolic BP Percentile --      Pulse Rate 08/30/23 1855 67     Resp 08/30/23 1855 18     Temp 08/30/23 1855 98.2 F (36.8 C)     Temp Source 08/30/23 1855 Oral     SpO2 08/30/23 1855 96 %     Weight --      Height --      Head Circumference --      Peak Flow --      Pain Score 08/30/23 1857 0     Pain Loc --      Pain Education --      Exclude from Growth Chart --    No data found.  Updated Vital Signs BP (!) 153/80 (BP Location: Right Arm)   Pulse 67   Temp 98.2 F (36.8 C) (Oral)   Resp 18   SpO2 96%   Visual Acuity Right Eye Distance:   Left Eye Distance:   Bilateral Distance:    Right Eye Near:   Left Eye Near:    Bilateral Near:     Physical Exam Vitals and nursing note reviewed.  Constitutional:      Appearance: Normal appearance. She is not ill-appearing.  HENT:     Head: Atraumatic.  Eyes:      Extraocular Movements: Extraocular movements intact.     Conjunctiva/sclera: Conjunctivae normal.  Cardiovascular:     Rate and Rhythm: Normal rate and regular rhythm.     Heart sounds: Normal heart sounds.  Pulmonary:     Effort: Pulmonary effort is normal.     Breath sounds: Normal breath sounds.  Abdominal:     General: Bowel sounds are normal. There is no distension.     Palpations: Abdomen is soft.     Tenderness: There is no abdominal tenderness. There is no right CVA tenderness, left CVA tenderness or guarding.  Musculoskeletal:        General: Normal range of motion.     Cervical back: Normal range of motion and neck supple.  Skin:    General: Skin is warm and dry.  Neurological:     Mental Status: She is alert. Mental status is at baseline.  Psychiatric:        Mood and Affect: Mood normal.        Thought Content: Thought content normal.        Judgment: Judgment normal.      UC Treatments / Results  Labs (all labs ordered are listed, but only abnormal results are displayed) Labs Reviewed  POCT URINALYSIS DIP (MANUAL ENTRY)    EKG   Radiology No results found.  Procedures Procedures (including critical care time)  Medications Ordered in UC Medications - No data to display  Initial Impression / Assessment and Plan / UC Course  I have reviewed the triage vital signs and the nursing notes.  Pertinent labs & imaging results that were available during my care of the patient were reviewed by me and considered in my medical decision making (see chart for details).     Urinalysis without abnormality today, vitals and exam reassuring.  Reassurance given, discussed good hydration, return precautions.  Final Clinical Impressions(s) / UC Diagnoses   Final diagnoses:  Dysuria   Discharge Instructions   None    ED Prescriptions   None    PDMP not reviewed this encounter.   Particia Nearing, [place] 08/30/23 1934

## 2023-08-30 NOTE — ED Triage Notes (Signed)
Pt reports she has some frequent urination and burning with urination since earlier today.

## 2023-08-31 DIAGNOSIS — Z940 Kidney transplant status: Secondary | ICD-10-CM | POA: Diagnosis not present

## 2023-09-06 ENCOUNTER — Ambulatory Visit (HOSPITAL_COMMUNITY): Payer: PPO | Attending: Internal Medicine

## 2023-09-06 DIAGNOSIS — R011 Cardiac murmur, unspecified: Secondary | ICD-10-CM | POA: Diagnosis not present

## 2023-09-06 LAB — ECHOCARDIOGRAM COMPLETE
AR max vel: 0.87 cm2
AV Area VTI: 0.89 cm2
AV Area mean vel: 0.8 cm2
AV Mean grad: 21.3 mm[Hg]
AV Peak grad: 38 mm[Hg]
Ao pk vel: 3.08 m/s
Calc EF: 71.5 %
P 1/2 time: 233 ms
S' Lateral: 2.3 cm
Single Plane A2C EF: 72.6 %
Single Plane A4C EF: 69.2 %

## 2023-09-28 DIAGNOSIS — Z940 Kidney transplant status: Secondary | ICD-10-CM | POA: Diagnosis not present

## 2023-10-11 ENCOUNTER — Encounter: Payer: Self-pay | Admitting: Urology

## 2023-10-11 ENCOUNTER — Ambulatory Visit: Payer: PPO | Admitting: Urology

## 2023-10-11 VITALS — BP 137/81 | HR 64

## 2023-10-11 DIAGNOSIS — N3941 Urge incontinence: Secondary | ICD-10-CM

## 2023-10-11 DIAGNOSIS — R351 Nocturia: Secondary | ICD-10-CM | POA: Diagnosis not present

## 2023-10-11 LAB — URINALYSIS, ROUTINE W REFLEX MICROSCOPIC
Bilirubin, UA: NEGATIVE
Glucose, UA: NEGATIVE
Ketones, UA: NEGATIVE
Leukocytes,UA: NEGATIVE
Nitrite, UA: NEGATIVE
Protein,UA: NEGATIVE
RBC, UA: NEGATIVE
Specific Gravity, UA: 1.02 (ref 1.005–1.030)
Urobilinogen, Ur: 1 mg/dL (ref 0.2–1.0)
pH, UA: 7 (ref 5.0–7.5)

## 2023-10-11 MED ORDER — MIRABEGRON ER 25 MG PO TB24: 25.0000 mg | ORAL_TABLET | Freq: Every day | ORAL | 0 refills | Status: DC

## 2023-10-11 MED ORDER — GEMTESA 75 MG PO TABS: 75.0000 mg | ORAL_TABLET | Freq: Every day | ORAL | 0 refills | Status: DC

## 2023-10-11 NOTE — Progress Notes (Signed)
Subjective: 1. Nocturia   2. Urge incontinence      Consult requested by Dr. Vangie Bicker is an 82 yo female who is sent for nocturia with UUI.  She is wearing depends so the number of voids isn't clear.  She has some urgency and UUI.  Her UA is clear today.   She has vascular dementia x 3 years and had a subdural evacuated in September.  The incontinence has worsened.  She feels she empties well.  She has had UTI's in the past and was hospitalized with one a years ago.  She has had no stones.  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    She has no fecal incontinence.   She has been on vesicare 10mg .  It was working prior to the subdural but not since.   Her PVR is 3ml. ROS:  Review of Systems  Psychiatric/Behavioral:  Positive for memory loss.   All other systems reviewed and are negative.   No Known Allergies  Past Medical History:  Diagnosis Date   Chronic kidney disease    had kidney transplant   Vascular dementia without behavioral disturbance [facility])     Past Surgical History:  Procedure Laterality Date   BRAIN SURGERY  2024   CHOLECYSTECTOMY     COLONOSCOPY  08/28/2012   Procedure: COLONOSCOPY;  Surgeon: Malissa Hippo, MD;  [facility];  Service: Endoscopy;  Laterality: N/A;  930   COLONOSCOPY WITH PROPOFOL N/A 07/14/2020   Procedure: COLONOSCOPY WITH PROPOFOL;  Surgeon: Malissa Hippo, MD;  [facility];  Service: Endoscopy;  Laterality: N/A;  930   HYSTEROSCOPY WITH D & C N/A 01/11/2021   Procedure: DILATATION AND CURETTAGE /HYSTEROSCOPY;  Surgeon: Vena Austria, MD;  Location: [facility];  Service: Gynecology;  Laterality: N/A;   KIDNEY TRANSPLANT     orif left wrist     TONSILLECTOMY      Social History   Socioeconomic History   Marital status: Married    Spouse name: Not on file   Number of children: Not on file   Years of education: Not on file   Highest education level: Not on  file  Occupational History   Not on file  Tobacco Use   Smoking status: Former   Smokeless tobacco: Never   Tobacco comments:    quit in 1977  Vaping Use   Vaping status: Never Used  Substance and Sexual Activity   Alcohol use: No   Drug use: No   Sexual activity: Yes    Birth control/protection: Post-menopausal  Other Topics Concern   Not on file  Social History Narrative   Not on file   Social Drivers of Health   Financial Resource Strain: Low Risk  (07/16/2023)   [facility]   Overall Financial Resource Strain (CARDIA)    Difficulty of Paying Living Expenses: Not hard at all  Food Insecurity: No Food Insecurity (07/16/2023)   Received [facility]   Hunger Vital Sign    Worried About Running Out of Food in the Last Year: Never true    Ran Out of Food in the Last Year: Never true  Transportation Needs: No Transportation Needs (07/16/2023)   Received from [facility]   PRAPARE - Transportation    Lack of Transportation (Medical): No    Lack of Transportation (Non-Medical): No  Physical Activity: Insufficiently  Active (07/11/2022)   Exercise Vital Sign    Days of Exercise per Week: 2 days    Minutes of Exercise per Session: 20 min  Stress: No Stress Concern Present (07/11/2022)   [employer] of Occupational Health - Occupational Stress Questionnaire    Feeling of Stress : Not at all  Social Connections: Moderately Integrated (07/11/2022)   Social Connection and Isolation Panel [NHANES]    Frequency of Communication with Friends and Family: Once a week    Frequency of Social Gatherings with Friends and Family: Once a week    Attends Religious Services: More than 4 times per year    Active Member of [employer] or Organizations: No    Attends Banker Meetings: 1 to 4 times per year    Marital Status: Married  Catering manager Violence: Not At Risk (07/27/2022)   Humiliation, Afraid, Rape, and Kick questionnaire    Fear of Current  or Ex-Partner: No    Emotionally Abused: No    Physically Abused: No    Sexually Abused: No    History reviewed. No pertinent family history.  Anti-infectives: Anti-infectives (From admission, onward)    None       Current Outpatient Medications  Medication Sig Dispense Refill   acetaminophen (TYLENOL) 325 MG tablet Take 650 mg by mouth every 6 (six) hours as needed.     amLODipine (NORVASC) 2.5 MG tablet Take 1 tablet (2.5 mg total) by mouth at bedtime. 30 tablet 0   Cholecalciferol (VITAMIN D) 2000 UNITS CAPS Take 2,000 Units by mouth daily.      donepezil (ARICEPT) 10 MG tablet Take 1 tablet (10 mg total) by mouth daily. 30 tablet 0   mirabegron ER (MYRBETRIQ) 25 MG TB24 tablet Take 1 tablet (25 mg total) by mouth daily. 28 tablet 0   mycophenolate (MYFORTIC) 180 MG EC tablet Take 3 tablets (540 mg total) by mouth 2 (two) times daily. 180 tablet 0   polyethylene glycol (MIRALAX / GLYCOLAX) 17 g packet Take 17 g by mouth daily.     Protein (PROSOURCE PO) Take 30 mLs by mouth 3 (three) times daily.     senna (SENOKOT) 8.6 MG tablet Take 1 tablet by mouth 2 (two) times daily.     simvastatin (ZOCOR) 20 MG tablet Take 1 tablet (20 mg total) by mouth at bedtime. 30 tablet 0   tacrolimus (PROGRAF) 0.5 MG capsule Take 1 capsule (0.5 mg total) by mouth 2 (two) times daily. 60 capsule 0   Vibegron (GEMTESA) 75 MG TABS Take 1 tablet (75 mg total) by mouth daily. 28 tablet 0   zinc oxide 20 % ointment Apply 1 Application topically as needed for irritation.     No current facility-administered medications for this visit.     Objective: Vital signs in last 24 hours: BP 137/81   Pulse 64   Intake/Output from previous day: No intake/output data recorded. Intake/Output this shift: @IOTHISSHIFT @   Physical Exam Vitals reviewed.  Constitutional:      Appearance: Normal appearance.  Cardiovascular:     Rate and Rhythm: Normal rate and regular rhythm.     Heart sounds: Murmur (SEM  2/5) heard.  Abdominal:     General: Abdomen is flat.     Palpations: Abdomen is soft. There is no mass.     Tenderness: There is no abdominal tenderness.  Neurological:     Mental Status: She is alert.     Lab Results:  Results for orders placed  or performed in visit on 10/11/23 (from the past 24 hours)  Urinalysis, Routine w reflex microscopic     Status: None   Collection Time: 10/11/23 11:41 AM  Result Value Ref Range   Specific Gravity, UA 1.020 1.005 - 1.030   pH, UA 7.0 5.0 - 7.5   Color, UA Yellow Yellow   Appearance Ur Clear Clear   Leukocytes,UA Negative Negative   Protein,UA Negative Negative/Trace   Glucose, UA Negative Negative   Ketones, UA Negative Negative   RBC, UA Negative Negative   Bilirubin, UA Negative Negative   Urobilinogen, Ur 1.0 0.2 - 1.0 mg/dL   Nitrite, UA Negative Negative   Microscopic Examination Comment    Narrative   Performed at:  [address] [facility] [address], [place], [place]  [id number] Lab Director: Chinita Pester MT, Phone:  [phone]    BMET No results for input(s): "NA", "K", "CL", "CO2", "GLUCOSE", "BUN", "CREATININE", "CALCIUM" in the last 72 hours. PT/INR No results for input(s): "LABPROT", "INR" in the last 72 hours. ABG No results for input(s): "PHART", "HCO3" in the last 72 hours.  Invalid input(s): "PCO2", "PO2"  Studies/Results: No results found.   Assessment/Plan: Nocturia with UUI.   She had initial benefit with vesicare but has progressed.   I will have her try Gemtesa and if that is not helpful or too expensive, she will try Myrbetriq 25mg . I have given her samples of both.  Side effects reviewed.  She will return in 4 weeks for a PVR.  If she doesn't improve, she may need urodynamics.   Meds ordered this encounter  Medications   mirabegron ER (MYRBETRIQ) 25 MG TB24 tablet    Sig: Take 1 tablet (25 mg total) by mouth daily.    Dispense:  28 tablet    Refill:  0   Vibegron (GEMTESA) 75 MG TABS     Sig: Take 1 tablet (75 mg total) by mouth daily.    Dispense:  28 tablet    Refill:  0     Orders Placed This Encounter  Procedures   Urinalysis, Routine w reflex microscopic   Bladder Scan (Post Void Residual) in office     Return in about 4 weeks (around 11/08/2023) for PVR with Sarah. .    CC: Carylon Perches MD     Bjorn Pippin 10/12/2023

## 2023-10-11 NOTE — Progress Notes (Signed)
PVR 3

## 2023-10-15 DIAGNOSIS — I672 Cerebral atherosclerosis: Secondary | ICD-10-CM | POA: Diagnosis not present

## 2023-10-15 DIAGNOSIS — I6203 Nontraumatic chronic subdural hemorrhage: Secondary | ICD-10-CM | POA: Diagnosis not present

## 2023-10-15 DIAGNOSIS — S065XAA Traumatic subdural hemorrhage with loss of consciousness status unknown, initial encounter: Secondary | ICD-10-CM | POA: Diagnosis not present

## 2023-10-30 DIAGNOSIS — Z940 Kidney transplant status: Secondary | ICD-10-CM | POA: Diagnosis not present

## 2023-11-06 DIAGNOSIS — Z940 Kidney transplant status: Secondary | ICD-10-CM | POA: Diagnosis not present

## 2023-11-06 DIAGNOSIS — F0150 Vascular dementia without behavioral disturbance: Secondary | ICD-10-CM | POA: Diagnosis not present

## 2023-11-06 DIAGNOSIS — R351 Nocturia: Secondary | ICD-10-CM | POA: Insufficient documentation

## 2023-11-06 DIAGNOSIS — N390 Urinary tract infection, site not specified: Secondary | ICD-10-CM | POA: Insufficient documentation

## 2023-11-06 DIAGNOSIS — B351 Tinea unguium: Secondary | ICD-10-CM | POA: Diagnosis not present

## 2023-11-06 DIAGNOSIS — N3941 Urge incontinence: Secondary | ICD-10-CM | POA: Insufficient documentation

## 2023-11-06 DIAGNOSIS — I739 Peripheral vascular disease, unspecified: Secondary | ICD-10-CM | POA: Diagnosis not present

## 2023-11-06 DIAGNOSIS — I10 Essential (primary) hypertension: Secondary | ICD-10-CM | POA: Diagnosis not present

## 2023-11-06 NOTE — Progress Notes (Signed)
Name: Kristine [name] DOB: [birth year] MRN: [id number]  History of Present Illness: [name] is a 83 y.o. female who presents today for follow up visit at [facility]. She is accompanied by her husband Peyton Najjar, who assists with providing history due to patient's vascular dementia. - GU history: 1. OAB with urinary frequency, nocturia, urgency, and urge incontinence. 2. ESRD s/p kidney transplant in 2009. 3. Recurrent UTIs with prior episode of urosepsis. 4. Vaginal atrophy.  Urine culture results in past 12 months: - 02/09/2023: Positive for E. coli - 06/06/2023: Positive for E. coli  At initial visit with Dr. Annabell Howells on 10/11/2023: - Seen for nocturia, urgency, and urge incontinence.  - Wearing depends so the number of voids isn't clear.   - PVR = 3 ml. - "She had initial benefit with vesicare but has progressed. I will have her try Gemtesa and if that is not helpful or too expensive, she will try Myrbetriq 25mg . I have given her samples of both. Side effects reviewed. She will return in 4 weeks for a PVR. If she doesn't improve, she may need urodynamics."  Today: She and her husband report that Leslye Peer was ineffective but the Myrbetriq (Mirabegron) 25 mg daily has been helpful along with timed voiding, especially overnight (intentionally waking up halfway through the night to empty her bladder). She is also trying to be more mindful about minimizing her evening fluid intake. Reports minimal caffeine intake (1 cup of decaf coffee in the mornings). Denies significant daytime urinary incontinence.   Denies any recent UTIs. She denies dysuria, gross hematuria, straining to void, or sensations of incomplete emptying.   Fall Screening: Do you usually have a device to assist in your mobility? No   Medications: Current Outpatient Medications  Medication Sig Dispense Refill   mirabegron ER (MYRBETRIQ) 50 MG TB24 tablet Take 1 tablet (50 mg total) by mouth daily. 30 tablet 5    acetaminophen (TYLENOL) 325 MG tablet Take 650 mg by mouth every 6 (six) hours as needed.     amLODipine (NORVASC) 2.5 MG tablet Take 1 tablet (2.5 mg total) by mouth at bedtime. 30 tablet 0   Cholecalciferol (VITAMIN D) 2000 UNITS CAPS Take 2,000 Units by mouth daily.      donepezil (ARICEPT) 10 MG tablet Take 1 tablet (10 mg total) by mouth daily. 30 tablet 0   mycophenolate (MYFORTIC) 180 MG EC tablet Take 3 tablets (540 mg total) by mouth 2 (two) times daily. 180 tablet 0   polyethylene glycol (MIRALAX / GLYCOLAX) 17 g packet Take 17 g by mouth daily.     Protein (PROSOURCE PO) Take 30 mLs by mouth 3 (three) times daily.     senna (SENOKOT) 8.6 MG tablet Take 1 tablet by mouth 2 (two) times daily.     simvastatin (ZOCOR) 20 MG tablet Take 1 tablet (20 mg total) by mouth at bedtime. 30 tablet 0   tacrolimus (PROGRAF) 0.5 MG capsule Take 1 capsule (0.5 mg total) by mouth 2 (two) times daily. 60 capsule 0   zinc oxide 20 % ointment Apply 1 Application topically as needed for irritation.     No current facility-administered medications for this visit.    Allergies: No Known Allergies  Past Medical History:  Diagnosis Date   Chronic kidney disease    had kidney transplant   Vascular dementia without behavioral disturbance [facility])    Past Surgical History:  Procedure Laterality Date   BRAIN SURGERY  2024  CHOLECYSTECTOMY     COLONOSCOPY  08/28/2012   Procedure: COLONOSCOPY;  Surgeon: Malissa Hippo, MD;  [facility];  Service: Endoscopy;  Laterality: N/A;  930   COLONOSCOPY WITH PROPOFOL N/A 07/14/2020   Procedure: COLONOSCOPY WITH PROPOFOL;  Surgeon: Malissa Hippo, MD;  [facility];  Service: Endoscopy;  Laterality: N/A;  930   HYSTEROSCOPY WITH D & C N/A 01/11/2021   Procedure: DILATATION AND CURETTAGE /HYSTEROSCOPY;  Surgeon: Vena Austria, MD;  Location: [facility];  Service: Gynecology;  Laterality: N/A;   KIDNEY TRANSPLANT     orif left wrist      TONSILLECTOMY     History reviewed. No pertinent family history. Social History   Socioeconomic History   Marital status: Married    Spouse name: Not on file   Number of children: Not on file   Years of education: Not on file   Highest education level: Not on file  Occupational History   Not on file  Tobacco Use   Smoking status: Former   Smokeless tobacco: Never   Tobacco comments:    quit in 1977  Vaping Use   Vaping status: Never Used  Substance and Sexual Activity   Alcohol use: No   Drug use: No   Sexual activity: Yes    Birth control/protection: Post-menopausal  Other Topics Concern   Not on file  Social History Narrative   Not on file   Social Drivers of Health   Financial Resource Strain: Low Risk  (07/16/2023)   Received from [facility]   Overall Financial Resource Strain (CARDIA)    Difficulty of Paying Living Expenses: Not hard at all  Food Insecurity: No Food Insecurity (07/16/2023)   Received from [facility]   Hunger Vital Sign    Worried About Running Out of Food in the Last Year: Never true    Ran Out of Food in the Last Year: Never true  Transportation Needs: No Transportation Needs (07/16/2023)   Received from [facility]   PRAPARE - Transportation    Lack of Transportation (Medical): No    Lack of Transportation (Non-Medical): No  Physical Activity: Insufficiently Active (07/11/2022)   Exercise Vital Sign    Days of Exercise per Week: 2 days    Minutes of Exercise per Session: 20 min  Stress: No Stress Concern Present (07/11/2022)   [employer] of Occupational Health - Occupational Stress Questionnaire    Feeling of Stress : Not at all  Social Connections: Moderately Integrated (07/11/2022)   Social Connection and Isolation Panel [NHANES]    Frequency of Communication with Friends and Family: Once a week    Frequency of Social Gatherings with Friends and Family: Once a week    Attends Religious Services: More than 4 times per  year    Active Member of [employer] or Organizations: No    Attends Engineer, structural: 1 to 4 times per year    Marital Status: Married  Catering manager Violence: Not At Risk (07/27/2022)   Humiliation, Afraid, Rape, and Kick questionnaire    Fear of Current or Ex-Partner: No    Emotionally Abused: No    Physically Abused: No    Sexually Abused: No    Review of Systems Constitutional: Patient denies any unintentional weight loss or change in strength lntegumentary: Patient denies any rashes or pruritus Cardiovascular: Patient denies chest pain or syncope Respiratory: Patient denies shortness of breath Gastrointestinal: Patient denies nausea, vomiting, constipation,  or diarrhea Musculoskeletal: Patient denies muscle cramps or weakness Neurologic: Patient denies convulsions or seizures Allergic/Immunologic: Patient denies recent allergic reaction(s) Hematologic/Lymphatic: Patient denies bleeding tendencies Endocrine: Patient denies heat/cold intolerance  GU: As per HPI.  OBJECTIVE Vitals:   11/13/23 1542  BP: (!) 144/83  Pulse: (!) 59  Temp: 97.9 F (36.6 C)   There is no height or weight on file to calculate BMI.  Physical Examination Constitutional: No obvious distress; patient is non-toxic appearing  Cardiovascular: No visible lower extremity edema.  Respiratory: The patient does not have audible wheezing/stridor; respirations do not appear labored  Gastrointestinal: Abdomen non-distended Musculoskeletal: Normal ROM of UEs  Skin: No obvious rashes/open sores  Neurologic: CN 2-12 grossly intact Psychiatric: Answered questions appropriately with normal affect  Hematologic/Lymphatic/Immunologic: No obvious bruises or sites of spontaneous bleeding  Urine microscopy: negative  PVR: 0 ml  ASSESSMENT OAB (overactive bladder) - Plan: Urinalysis, Routine w reflex microscopic, BLADDER SCAN AMB NON-IMAGING, mirabegron ER (MYRBETRIQ) 50 MG TB24 tablet  Urge  incontinence - Plan: mirabegron ER (MYRBETRIQ) 50 MG TB24 tablet  Nocturia - Plan: mirabegron ER (MYRBETRIQ) 50 MG TB24 tablet  Recurrent UTI  No evidence of UTI or incomplete bladder emptying today. OAB symptoms are somewhat improved on Myrbetriq 25 mg daily along with behavioral modifications. We agreed to increase dose to 50 mg daily in hopes that will help further. Reviewed potential side effects such as incomplete bladder emptying.   Will plan for follow up in 6 or sooner if needed. Pt verbalized understanding and agreement. All questions were answered.  PLAN Advised the following: 1. Myrbetriq 50 mg daily. 2. Continue minimal caffeine intake. 3. Continue timed voiding. 4. Return in about 6 weeks (around 12/25/2023) for UA, PVR, & f/u with Evette Georges NP.  Orders Placed This Encounter  Procedures   Urinalysis, Routine w reflex microscopic   BLADDER SCAN AMB NON-IMAGING    It has been explained that the patient is to follow regularly with their PCP in addition to all other providers involved in their care and to follow instructions provided by these respective offices. Patient advised to contact urology clinic if any urologic-pertaining questions, concerns, new symptoms or problems arise in the interim period.  There are no Patient Instructions on file for this visit.  Electronically signed by:  Donnita Falls, FNP   11/13/23    4:14 PM

## 2023-11-07 DIAGNOSIS — S065XAA Traumatic subdural hemorrhage with loss of consciousness status unknown, initial encounter: Secondary | ICD-10-CM | POA: Diagnosis not present

## 2023-11-13 ENCOUNTER — Ambulatory Visit (INDEPENDENT_AMBULATORY_CARE_PROVIDER_SITE_OTHER): Payer: PPO | Admitting: Urology

## 2023-11-13 ENCOUNTER — Encounter: Payer: Self-pay | Admitting: Urology

## 2023-11-13 VITALS — BP 144/83 | HR 59 | Temp 97.9°F

## 2023-11-13 DIAGNOSIS — Z87440 Personal history of urinary (tract) infections: Secondary | ICD-10-CM | POA: Diagnosis not present

## 2023-11-13 DIAGNOSIS — N3281 Overactive bladder: Secondary | ICD-10-CM

## 2023-11-13 DIAGNOSIS — N390 Urinary tract infection, site not specified: Secondary | ICD-10-CM

## 2023-11-13 DIAGNOSIS — R351 Nocturia: Secondary | ICD-10-CM | POA: Diagnosis not present

## 2023-11-13 DIAGNOSIS — N3941 Urge incontinence: Secondary | ICD-10-CM | POA: Diagnosis not present

## 2023-11-13 LAB — BLADDER SCAN AMB NON-IMAGING: Scan Result: 0

## 2023-11-13 MED ORDER — MIRABEGRON ER 50 MG PO TB24: 50.0000 mg | ORAL_TABLET | Freq: Every day | ORAL | 5 refills | Status: DC

## 2023-11-13 NOTE — Progress Notes (Signed)
post void residual=0 ?

## 2023-11-14 LAB — URINALYSIS, ROUTINE W REFLEX MICROSCOPIC
Bilirubin, UA: NEGATIVE
Glucose, UA: NEGATIVE
Ketones, UA: NEGATIVE
Leukocytes,UA: NEGATIVE
Nitrite, UA: NEGATIVE
Protein,UA: NEGATIVE
Specific Gravity, UA: 1.025 (ref 1.005–1.030)
Urobilinogen, Ur: 1 mg/dL (ref 0.2–1.0)
pH, UA: 6 (ref 5.0–7.5)

## 2023-11-14 LAB — MICROSCOPIC EXAMINATION: Bacteria, UA: NONE SEEN

## 2023-11-23 DIAGNOSIS — I129 Hypertensive chronic kidney disease with stage 1 through stage 4 chronic kidney disease, or unspecified chronic kidney disease: Secondary | ICD-10-CM | POA: Diagnosis not present

## 2023-11-23 DIAGNOSIS — F0150 Vascular dementia without behavioral disturbance: Secondary | ICD-10-CM | POA: Diagnosis not present

## 2023-11-23 DIAGNOSIS — D369 Benign neoplasm, unspecified site: Secondary | ICD-10-CM | POA: Diagnosis not present

## 2023-11-23 DIAGNOSIS — I700 Atherosclerosis of aorta: Secondary | ICD-10-CM | POA: Diagnosis not present

## 2023-11-23 DIAGNOSIS — N281 Cyst of kidney, acquired: Secondary | ICD-10-CM | POA: Diagnosis not present

## 2023-11-23 DIAGNOSIS — D849 Immunodeficiency, unspecified: Secondary | ICD-10-CM | POA: Diagnosis not present

## 2023-11-23 DIAGNOSIS — E785 Hyperlipidemia, unspecified: Secondary | ICD-10-CM | POA: Diagnosis not present

## 2023-11-23 DIAGNOSIS — Z4822 Encounter for aftercare following kidney transplant: Secondary | ICD-10-CM | POA: Diagnosis not present

## 2023-11-23 DIAGNOSIS — E782 Mixed hyperlipidemia: Secondary | ICD-10-CM | POA: Diagnosis not present

## 2023-11-23 DIAGNOSIS — I6622 Occlusion and stenosis of left posterior cerebral artery: Secondary | ICD-10-CM | POA: Diagnosis not present

## 2023-11-23 DIAGNOSIS — Z940 Kidney transplant status: Secondary | ICD-10-CM | POA: Diagnosis not present

## 2023-11-23 DIAGNOSIS — N3941 Urge incontinence: Secondary | ICD-10-CM | POA: Diagnosis not present

## 2023-11-23 DIAGNOSIS — N390 Urinary tract infection, site not specified: Secondary | ICD-10-CM | POA: Diagnosis not present

## 2023-11-23 DIAGNOSIS — I359 Nonrheumatic aortic valve disorder, unspecified: Secondary | ICD-10-CM | POA: Diagnosis not present

## 2023-11-23 DIAGNOSIS — N271 Small kidney, bilateral: Secondary | ICD-10-CM | POA: Diagnosis not present

## 2023-11-23 DIAGNOSIS — Z79899 Other long term (current) drug therapy: Secondary | ICD-10-CM | POA: Diagnosis not present

## 2023-11-23 DIAGNOSIS — N189 Chronic kidney disease, unspecified: Secondary | ICD-10-CM | POA: Diagnosis not present

## 2023-11-23 DIAGNOSIS — I10 Essential (primary) hypertension: Secondary | ICD-10-CM | POA: Diagnosis not present

## 2023-11-23 DIAGNOSIS — I771 Stricture of artery: Secondary | ICD-10-CM | POA: Diagnosis not present

## 2023-11-27 DIAGNOSIS — Z940 Kidney transplant status: Secondary | ICD-10-CM | POA: Diagnosis not present

## 2023-12-07 DIAGNOSIS — Z87891 Personal history of nicotine dependence: Secondary | ICD-10-CM | POA: Diagnosis not present

## 2023-12-07 DIAGNOSIS — Z8616 Personal history of COVID-19: Secondary | ICD-10-CM | POA: Diagnosis not present

## 2023-12-07 DIAGNOSIS — G8929 Other chronic pain: Secondary | ICD-10-CM | POA: Diagnosis not present

## 2023-12-07 DIAGNOSIS — I10 Essential (primary) hypertension: Secondary | ICD-10-CM | POA: Diagnosis not present

## 2023-12-07 DIAGNOSIS — Z87820 Personal history of traumatic brain injury: Secondary | ICD-10-CM | POA: Diagnosis not present

## 2023-12-07 DIAGNOSIS — M25569 Pain in unspecified knee: Secondary | ICD-10-CM | POA: Diagnosis not present

## 2023-12-07 DIAGNOSIS — Z940 Kidney transplant status: Secondary | ICD-10-CM | POA: Diagnosis not present

## 2023-12-07 DIAGNOSIS — F0150 Vascular dementia without behavioral disturbance: Secondary | ICD-10-CM | POA: Diagnosis not present

## 2023-12-07 DIAGNOSIS — R3915 Urgency of urination: Secondary | ICD-10-CM | POA: Diagnosis not present

## 2023-12-07 DIAGNOSIS — E785 Hyperlipidemia, unspecified: Secondary | ICD-10-CM | POA: Diagnosis not present

## 2023-12-07 DIAGNOSIS — Z79899 Other long term (current) drug therapy: Secondary | ICD-10-CM | POA: Diagnosis not present

## 2023-12-10 DIAGNOSIS — I498 Other specified cardiac arrhythmias: Secondary | ICD-10-CM | POA: Diagnosis not present

## 2023-12-10 DIAGNOSIS — R079 Chest pain, unspecified: Secondary | ICD-10-CM | POA: Diagnosis not present

## 2023-12-10 DIAGNOSIS — T671XXA Heat syncope, initial encounter: Secondary | ICD-10-CM | POA: Diagnosis not present

## 2023-12-10 DIAGNOSIS — I350 Nonrheumatic aortic (valve) stenosis: Secondary | ICD-10-CM | POA: Diagnosis not present

## 2023-12-18 DIAGNOSIS — R9431 Abnormal electrocardiogram [ECG] [EKG]: Secondary | ICD-10-CM | POA: Diagnosis not present

## 2023-12-18 DIAGNOSIS — I350 Nonrheumatic aortic (valve) stenosis: Secondary | ICD-10-CM | POA: Diagnosis not present

## 2023-12-18 DIAGNOSIS — I352 Nonrheumatic aortic (valve) stenosis with insufficiency: Secondary | ICD-10-CM | POA: Diagnosis not present

## 2023-12-18 DIAGNOSIS — R32 Unspecified urinary incontinence: Secondary | ICD-10-CM | POA: Diagnosis not present

## 2023-12-18 DIAGNOSIS — Z87891 Personal history of nicotine dependence: Secondary | ICD-10-CM | POA: Diagnosis not present

## 2023-12-18 DIAGNOSIS — I10 Essential (primary) hypertension: Secondary | ICD-10-CM | POA: Diagnosis not present

## 2023-12-18 DIAGNOSIS — M1711 Unilateral primary osteoarthritis, right knee: Secondary | ICD-10-CM | POA: Diagnosis not present

## 2023-12-18 DIAGNOSIS — F0150 Vascular dementia without behavioral disturbance: Secondary | ICD-10-CM | POA: Diagnosis not present

## 2023-12-18 DIAGNOSIS — Z940 Kidney transplant status: Secondary | ICD-10-CM | POA: Diagnosis not present

## 2023-12-18 DIAGNOSIS — E785 Hyperlipidemia, unspecified: Secondary | ICD-10-CM | POA: Diagnosis not present

## 2023-12-20 DIAGNOSIS — I350 Nonrheumatic aortic (valve) stenosis: Secondary | ICD-10-CM | POA: Diagnosis not present

## 2023-12-21 ENCOUNTER — Other Ambulatory Visit: Payer: Self-pay

## 2023-12-21 ENCOUNTER — Telehealth: Payer: Self-pay | Admitting: Urology

## 2023-12-21 DIAGNOSIS — R351 Nocturia: Secondary | ICD-10-CM

## 2023-12-21 DIAGNOSIS — N3941 Urge incontinence: Secondary | ICD-10-CM

## 2023-12-21 DIAGNOSIS — N3281 Overactive bladder: Secondary | ICD-10-CM

## 2023-12-21 MED ORDER — MIRABEGRON ER 50 MG PO TB24: 50.0000 mg | ORAL_TABLET | Freq: Every day | ORAL | 3 refills | Status: DC

## 2023-12-21 NOTE — Telephone Encounter (Signed)
 Husband called and would like Myrbetriq changed to a 90 day supply.

## 2023-12-21 NOTE — Telephone Encounter (Signed)
Order updated and sent to pharmacy

## 2023-12-26 ENCOUNTER — Ambulatory Visit: Payer: PPO | Admitting: Urology

## 2023-12-26 DIAGNOSIS — Z940 Kidney transplant status: Secondary | ICD-10-CM | POA: Diagnosis not present

## 2023-12-26 DIAGNOSIS — R079 Chest pain, unspecified: Secondary | ICD-10-CM | POA: Diagnosis not present

## 2024-01-01 DIAGNOSIS — I129 Hypertensive chronic kidney disease with stage 1 through stage 4 chronic kidney disease, or unspecified chronic kidney disease: Secondary | ICD-10-CM | POA: Diagnosis not present

## 2024-01-01 DIAGNOSIS — E785 Hyperlipidemia, unspecified: Secondary | ICD-10-CM | POA: Diagnosis not present

## 2024-01-01 DIAGNOSIS — N189 Chronic kidney disease, unspecified: Secondary | ICD-10-CM | POA: Diagnosis not present

## 2024-01-01 DIAGNOSIS — R079 Chest pain, unspecified: Secondary | ICD-10-CM | POA: Diagnosis not present

## 2024-01-03 ENCOUNTER — Encounter: Payer: Self-pay | Admitting: Urology

## 2024-01-03 ENCOUNTER — Ambulatory Visit: Admitting: Urology

## 2024-01-03 VITALS — BP 117/74 | HR 59 | Temp 97.5°F

## 2024-01-03 DIAGNOSIS — N952 Postmenopausal atrophic vaginitis: Secondary | ICD-10-CM | POA: Diagnosis not present

## 2024-01-03 DIAGNOSIS — N3941 Urge incontinence: Secondary | ICD-10-CM | POA: Diagnosis not present

## 2024-01-03 DIAGNOSIS — Z940 Kidney transplant status: Secondary | ICD-10-CM

## 2024-01-03 DIAGNOSIS — N3281 Overactive bladder: Secondary | ICD-10-CM | POA: Diagnosis not present

## 2024-01-03 DIAGNOSIS — R531 Weakness: Secondary | ICD-10-CM | POA: Diagnosis not present

## 2024-01-03 DIAGNOSIS — R399 Unspecified symptoms and signs involving the genitourinary system: Secondary | ICD-10-CM | POA: Diagnosis not present

## 2024-01-03 DIAGNOSIS — D849 Immunodeficiency, unspecified: Secondary | ICD-10-CM

## 2024-01-03 DIAGNOSIS — R8290 Unspecified abnormal findings in urine: Secondary | ICD-10-CM | POA: Diagnosis not present

## 2024-01-03 DIAGNOSIS — R5383 Other fatigue: Secondary | ICD-10-CM | POA: Diagnosis not present

## 2024-01-03 DIAGNOSIS — N390 Urinary tract infection, site not specified: Secondary | ICD-10-CM

## 2024-01-03 LAB — MICROSCOPIC EXAMINATION: WBC, UA: >30 /HPF — AB (ref 0–5)

## 2024-01-03 LAB — URINALYSIS, ROUTINE W REFLEX MICROSCOPIC
Bilirubin, UA: NEGATIVE
Glucose, UA: NEGATIVE
Ketones, UA: NEGATIVE
Nitrite, UA: NEGATIVE
Protein,UA: NEGATIVE
Specific Gravity, UA: 1.025 (ref 1.005–1.030)
Urobilinogen, Ur: 1 mg/dL (ref 0.2–1.0)
pH, UA: 6 (ref 5.0–7.5)

## 2024-01-03 LAB — BLADDER SCAN AMB NON-IMAGING: Scan Result: 1

## 2024-01-03 MED ORDER — NITROFURANTOIN MONOHYD MACRO 100 MG PO CAPS: 100.0000 mg | ORAL_CAPSULE | Freq: Two times a day (BID) | ORAL | 0 refills | Status: AC

## 2024-01-03 NOTE — Progress Notes (Unsigned)
 Name: Kristine [name] DOB: 07-01-1941 MRN: [id number]  History of Present Illness: [name] is a 83 y.o. female who presents today for follow up visit at [facility] [place]. She is accompanied by her husband Peyton Najjar, who assists with providing history due to patient's vascular dementia. > GU history: 1. OAB with urinary frequency, nocturia, urgency, and urge incontinence. - Failed Vesicare and Gemtesa. 2. ESRD s/p kidney transplant in 2009. 3. Recurrent UTIs with prior episode of urosepsis. - Increased risk due to immunosuppressed status.  4. Vaginal atrophy.  Urine culture results in past 12 months: - 02/09/2023: Positive for E. coli - 06/06/2023: Positive for E. coli  At last visit on 11/13/2023: - OAB symptoms somewhat improved on Myrbetriq 25 mg daily + behavioral modifications such as timed voiding (including intentionally waking up halfway through the night to empty her bladder), minimizing evening fluid intake, and minimal caffeine intake. - The plan was: 1. Increase Myrbetriq from 25 to 50 mg daily. 2. Continue minimal caffeine intake. 3. Continue timed voiding. 4. Return in about 6 weeks (around 12/25/2023) for UA, PVR, & f/u with Evette Georges NP.  Since last visit: > 11/23/2023: Normal renal function (creatinine 0.64, GFR 88).  Today: She denies noticing any significant change in her OAB symptoms Myrbetriq dose increase from 25 to 50 mg daily but her husband reports he does think it has been somewhat more effective than the 24 mg dose.   She reports that since undergoing a stress test two days ago on Tuesday she has been feeling "washed out" with fatigue and weakness. Denies fever, acute mental status change, or acute change in her urinary symptoms.   Medications: Current Outpatient Medications  Medication Sig Dispense Refill   acetaminophen (TYLENOL) 325 MG tablet Take 650 mg by mouth every 6 (six) hours as needed.     amLODipine (NORVASC) 2.5 MG tablet Take  1 tablet (2.5 mg total) by mouth at bedtime. (Patient taking differently: Take 5 mg by mouth at bedtime.) 30 tablet 0   Cholecalciferol (VITAMIN D) 2000 UNITS CAPS Take 2,000 Units by mouth daily.      donepezil (ARICEPT) 10 MG tablet Take 1 tablet (10 mg total) by mouth daily. 30 tablet 0   mirabegron ER (MYRBETRIQ) 50 MG TB24 tablet Take 1 tablet (50 mg total) by mouth daily. 90 tablet 3   mycophenolate (MYFORTIC) 180 MG EC tablet Take 3 tablets (540 mg total) by mouth 2 (two) times daily. 180 tablet 0   nitrofurantoin, macrocrystal-monohydrate, (MACROBID) 100 MG capsule Take 1 capsule (100 mg total) by mouth 2 (two) times daily for 7 days. 14 capsule 0   polyethylene glycol (MIRALAX / GLYCOLAX) 17 g packet Take 17 g by mouth daily.     Protein (PROSOURCE PO) Take 30 mLs by mouth 3 (three) times daily.     senna (SENOKOT) 8.6 MG tablet Take 1 tablet by mouth 2 (two) times daily.     simvastatin (ZOCOR) 20 MG tablet Take 1 tablet (20 mg total) by mouth at bedtime. 30 tablet 0   tacrolimus (PROGRAF) 0.5 MG capsule Take 1 capsule (0.5 mg total) by mouth 2 (two) times daily. 60 capsule 0   zinc oxide 20 % ointment Apply 1 Application topically as needed for irritation.     No current facility-administered medications for this visit.    Allergies: No Known Allergies  Past Medical History:  Diagnosis Date   Chronic kidney disease    had kidney transplant  Vascular dementia without behavioral disturbance [facility])    Past Surgical History:  Procedure Laterality Date   BRAIN SURGERY  2024   CHOLECYSTECTOMY     COLONOSCOPY  08/28/2012   Procedure: COLONOSCOPY;  Surgeon: Malissa Hippo, MD;  [facility];  Service: Endoscopy;  Laterality: N/A;  930   COLONOSCOPY WITH PROPOFOL N/A 07/14/2020   Procedure: COLONOSCOPY WITH PROPOFOL;  Surgeon: Malissa Hippo, MD;  [facility];  Service: Endoscopy;  Laterality: N/A;  930   HYSTEROSCOPY WITH D & C N/A 01/11/2021    Procedure: DILATATION AND CURETTAGE /HYSTEROSCOPY;  Surgeon: Vena Austria, MD;  Location: [facility];  Service: Gynecology;  Laterality: N/A;   KIDNEY TRANSPLANT     orif left wrist     TONSILLECTOMY     History reviewed. No pertinent family history. Social History   Socioeconomic History   Marital status: Married    Spouse name: Not on file   Number of children: Not on file   Years of education: Not on file   Highest education level: Not on file  Occupational History   Not on file  Tobacco Use   Smoking status: Former   Smokeless tobacco: Never   Tobacco comments:    quit in 1977  Vaping Use   Vaping status: Never Used  Substance and Sexual Activity   Alcohol use: No   Drug use: No   Sexual activity: Yes    Birth control/protection: Post-menopausal  Other Topics Concern   Not on file  Social History Narrative   Not on file   Social Drivers of Health   Financial Resource Strain: Low Risk  (07/16/2023)   Received [facility]   Overall Financial Resource Strain (CARDIA)    Difficulty of Paying Living Expenses: Not hard at all  Food Insecurity: No Food Insecurity (07/16/2023)   Received from [facility]   Hunger Vital Sign    Worried About Running Out of Food in the Last Year: Never true    Ran Out of Food in the Last Year: Never true  Transportation Needs: No Transportation Needs (07/16/2023)   Received from [facility]   PRAPARE - Transportation    Lack of Transportation (Medical): No    Lack of Transportation (Non-Medical): No  Physical Activity: Insufficiently Active (07/11/2022)   Exercise Vital Sign    Days of Exercise per Week: 2 days    Minutes of Exercise per Session: 20 min  Stress: No Stress Concern Present (07/11/2022)   [employer] of Occupational Health - Occupational Stress Questionnaire    Feeling of Stress : Not at all  Social Connections: Moderately Integrated (07/11/2022)   Social Connection and Isolation Panel [NHANES]     Frequency of Communication with Friends and Family: Once a week    Frequency of Social Gatherings with Friends and Family: Once a week    Attends Religious Services: More than 4 times per year    Active Member of [employer] or Organizations: No    Attends Engineer, structural: 1 to 4 times per year    Marital Status: Married  Catering manager Violence: Not At Risk (07/27/2022)   Humiliation, Afraid, Rape, and Kick questionnaire    Fear of Current or Ex-Partner: No    Emotionally Abused: No    Physically Abused: No    Sexually Abused: No    Review of Systems Constitutional: Patient reports feeling fatigued lntegumentary: Patient denies any rashes or pruritus Cardiovascular:  Patient denies chest pain or syncope Respiratory: Patient denies shortness of breath Gastrointestinal: Patient denies nausea or vomiting Musculoskeletal: Patient reports feeling weak  Neurologic: Patient denies convulsions or seizures Hematologic/Lymphatic: Patient denies bleeding tendencies Endocrine: Patient denies heat/cold intolerance  GU: As per HPI.  OBJECTIVE Vitals:   01/03/24 1437  BP: 117/74  Pulse: (!) 59  Temp: (!) 97.5 F (36.4 C)   There is no height or weight on file to calculate BMI.  Physical Examination Constitutional: Patient reclining in exam chair; appears fatigued  Cardiovascular: No visible lower extremity edema.  Respiratory: The patient does not have audible wheezing/stridor; respirations do not appear labored  Gastrointestinal: Abdomen non-distended Musculoskeletal: Normal ROM of UEs  Skin: No obvious rashes/open sores  Neurologic: CN 2-12 grossly intact Psychiatric: Answered questions appropriately with normal affect  Hematologic/Lymphatic/Immunologic: No obvious bruises or sites of spontaneous bleeding  Urine microscopy: >30 WBC/hpf, 3-10 RBC/hpf, few bacteria PVR: 1 ml  ASSESSMENT OAB (overactive bladder) - Plan: Urinalysis, Routine w reflex microscopic, BLADDER  SCAN AMB NON-IMAGING  Recurrent UTI - Plan: Urinalysis, Routine w reflex microscopic, BLADDER SCAN AMB NON-IMAGING, nitrofurantoin, macrocrystal-monohydrate, (MACROBID) 100 MG capsule, Urine culture  Vaginal atrophy - Plan: Urinalysis, Routine w reflex microscopic, BLADDER SCAN AMB NON-IMAGING  Urge incontinence  KIDNEY TRANSPLANTATION  Immunosuppressed status (HCC) - Plan: nitrofurantoin, macrocrystal-monohydrate, (MACROBID) 100 MG capsule, Urine culture  Abnormal urinalysis - Plan: nitrofurantoin, macrocrystal-monohydrate, (MACROBID) 100 MG capsule, Urine culture  UTI symptoms - Plan: nitrofurantoin, macrocrystal-monohydrate, (MACROBID) 100 MG capsule, Urine culture  We discussed suspicion for UTI based on current symptoms and abnormal UA today. Will check urine culture and treat empirically with Macrobid while awaiting culture results and sensitivities. We discussed how UTI (if confirmed) can exacerbate symptoms including urinary urgency & frequency, so it's difficult to accurately discern her true response to the Myrbetriq dose increase at this time. We agreed to continue Myrbetriq 50 mg daily, minimal caffeine intake, and timed voiding. Will plan for follow up in about 6 weeks or sooner if needed. Patient and husband verbalized understanding and agreement. All questions were answered.  PLAN Advised the following: 1. Urine culture. 2. Macrobid (Nitrofurantoin) 100 mg 2x/day for 7 days. 3. Continue Myrbetriq 50 mg daily. 4. Continue minimal caffeine intake. 5. Continue timed voiding.  6. Return in about 6 weeks (around 02/14/2024) for UA, PVR, & f/u with Evette Georges NP.  Orders Placed This Encounter  Procedures   Urine culture   Microscopic Examination   Urinalysis, Routine w reflex microscopic   BLADDER SCAN AMB NON-IMAGING   It has been explained that the patient is to follow regularly with their PCP in addition to all other providers involved in their care and to follow  instructions provided by these respective offices. Patient advised to contact urology clinic if any urologic-pertaining questions, concerns, new symptoms or problems arise in the interim period.  There are no Patient Instructions on file for this visit.  Electronically signed by:  Donnita Falls, FNP   01/04/24    12:41 PM

## 2024-01-05 LAB — URINE CULTURE

## 2024-01-07 ENCOUNTER — Telehealth: Payer: Self-pay

## 2024-01-07 NOTE — Telephone Encounter (Signed)
 Patient is made aware and advised to D/C current antibiotic. Patient voiced understanding

## 2024-01-07 NOTE — Telephone Encounter (Signed)
-----   Message from Kristine [name] sent at 01/05/2024 10:04 AM EDT ----- Please notify patient: Negative urine culture, no antibiotic needed at this time. Thanks.

## 2024-01-30 DIAGNOSIS — Z940 Kidney transplant status: Secondary | ICD-10-CM | POA: Diagnosis not present

## 2024-02-04 DIAGNOSIS — I10 Essential (primary) hypertension: Secondary | ICD-10-CM | POA: Diagnosis not present

## 2024-02-04 DIAGNOSIS — F0150 Vascular dementia without behavioral disturbance: Secondary | ICD-10-CM | POA: Diagnosis not present

## 2024-02-04 DIAGNOSIS — R001 Bradycardia, unspecified: Secondary | ICD-10-CM | POA: Diagnosis not present

## 2024-02-11 DIAGNOSIS — Z09 Encounter for follow-up examination after completed treatment for conditions other than malignant neoplasm: Secondary | ICD-10-CM | POA: Diagnosis not present

## 2024-02-11 DIAGNOSIS — Z08 Encounter for follow-up examination after completed treatment for malignant neoplasm: Secondary | ICD-10-CM | POA: Diagnosis not present

## 2024-02-11 DIAGNOSIS — Z85828 Personal history of other malignant neoplasm of skin: Secondary | ICD-10-CM | POA: Diagnosis not present

## 2024-02-11 DIAGNOSIS — L821 Other seborrheic keratosis: Secondary | ICD-10-CM | POA: Diagnosis not present

## 2024-02-11 DIAGNOSIS — D225 Melanocytic nevi of trunk: Secondary | ICD-10-CM | POA: Diagnosis not present

## 2024-02-11 DIAGNOSIS — Z872 Personal history of diseases of the skin and subcutaneous tissue: Secondary | ICD-10-CM | POA: Diagnosis not present

## 2024-02-11 DIAGNOSIS — L814 Other melanin hyperpigmentation: Secondary | ICD-10-CM | POA: Diagnosis not present

## 2024-02-14 ENCOUNTER — Encounter: Payer: Self-pay | Admitting: Urology

## 2024-02-14 ENCOUNTER — Ambulatory Visit: Admitting: Urology

## 2024-02-14 VITALS — BP 121/66 | HR 64

## 2024-02-14 DIAGNOSIS — Z87440 Personal history of urinary (tract) infections: Secondary | ICD-10-CM

## 2024-02-14 DIAGNOSIS — N3941 Urge incontinence: Secondary | ICD-10-CM | POA: Diagnosis not present

## 2024-02-14 DIAGNOSIS — N3281 Overactive bladder: Secondary | ICD-10-CM

## 2024-02-14 DIAGNOSIS — N952 Postmenopausal atrophic vaginitis: Secondary | ICD-10-CM

## 2024-02-14 DIAGNOSIS — Z940 Kidney transplant status: Secondary | ICD-10-CM | POA: Diagnosis not present

## 2024-02-14 DIAGNOSIS — N390 Urinary tract infection, site not specified: Secondary | ICD-10-CM

## 2024-02-14 LAB — BLADDER SCAN AMB NON-IMAGING: Scan Result: 0

## 2024-02-14 NOTE — Progress Notes (Signed)
 Name: Kristine [name] DOB: 05-05-1941 MRN: [id number]  History of Present Illness: [name] is a 83 y.o. female who presents today for follow up visit at [facility] [place]. She is accompanied by her husband Kristine [name], who assists with providing history due to patient's vascular dementia. > GU history: 1. OAB with urinary frequency, nocturia, urgency, and urge incontinence. - Failed Vesicare  and Gemtesa . 2. ESRD s/p kidney transplant in 2009. 3. Recurrent UTIs with prior episode of urosepsis. - Increased risk due to immunosuppressed status.  4. Vaginal atrophy.  At last visit on 01/03/2024: - Unclear if Myrbetriq  dose increase from 25 to 50 mg daily was helpful.  - Urine microscopy: >30 WBC/hpf, 3-10 RBC/hpf, few bacteria - PVR = 1 ml - The plan was: 1. Urine culture for possible UTI based on symptoms and UA (came back negative). 2. Macrobid  (Nitrofurantoin ) 100 mg 2x/day for 7 days for empiric UTI treatment pending culture result. 3. Continue Myrbetriq  50 mg daily. 4. Continue minimal caffeine intake. 5. Continue timed voiding. 6. Return in about 6 weeks (around 02/14/2024) for UA, PVR, & f/u with Kristine Lederer NP.  Today: Mr. Kristine [name] reports that Kristine [name] has good urinary control when she is awake with no significantly bothersome urinary urgency, or urge incontinence during the day. He states that whenever she is asleep however (naps and bedtime) she consistently has enuresis. He is significantly bothered by that because he has trouble falling back asleep for several hours, whereas he states that Kristine [name] is able to fall back to sleep almost immediately. Has been taking Myrbetriq  at 8pm (patient's reported bedtime). Denies caffeine intake.  Kristine [name] reports that they have scheduled an appointment with [facility] on 04/07/2024 for second opinion / further evaluation.   Medications: Current Outpatient Medications  Medication Sig Dispense Refill    acetaminophen  (TYLENOL ) 325 MG tablet Take 650 mg by mouth every 6 (six) hours as needed.     amLODipine  (NORVASC ) 5 MG tablet Take 5 mg by mouth daily.     Cholecalciferol (VITAMIN D) 2000 UNITS CAPS Take 2,000 Units by mouth daily.      donepezil  (ARICEPT ) 10 MG tablet Take 1 tablet (10 mg total) by mouth daily. (Patient taking differently: Take 5 mg by mouth daily.) 30 tablet 0   mirabegron  ER (MYRBETRIQ ) 50 MG TB24 tablet Take 1 tablet (50 mg total) by mouth daily. 90 tablet 3   mycophenolate  (MYFORTIC ) 180 MG EC tablet Take 3 tablets (540 mg total) by mouth 2 (two) times daily. 180 tablet 0   polyethylene glycol (MIRALAX  / GLYCOLAX ) 17 g packet Take 17 g by mouth daily.     Protein (PROSOURCE PO) Take 30 mLs by mouth 3 (three) times daily.     senna (SENOKOT) 8.6 MG tablet Take 1 tablet by mouth 2 (two) times daily.     simvastatin  (ZOCOR ) 20 MG tablet Take 1 tablet (20 mg total) by mouth at bedtime. 30 tablet 0   tacrolimus  (PROGRAF ) 0.5 MG capsule Take 1 capsule (0.5 mg total) by mouth 2 (two) times daily. 60 capsule 0   zinc oxide 20 % ointment Apply 1 Application topically as needed for irritation.     amLODipine  (NORVASC ) 2.5 MG tablet Take 1 tablet (2.5 mg total) by mouth at bedtime. (Patient not taking: Reported on 02/14/2024) 30 tablet 0   No current facility-administered medications for this visit.    Allergies: No Known Allergies  Past Medical History:  Diagnosis Date  Chronic kidney disease    had kidney transplant   Vascular dementia without behavioral disturbance [facility])    Past Surgical History:  Procedure Laterality Date   BRAIN SURGERY  2024   CHOLECYSTECTOMY     COLONOSCOPY  08/28/2012   Procedure: COLONOSCOPY;  Surgeon: Ruby Corporal, MD;  [facility];  Service: Endoscopy;  Laterality: N/A;  930   COLONOSCOPY WITH PROPOFOL  N/A 07/14/2020   Procedure: COLONOSCOPY WITH PROPOFOL ;  Surgeon: Ruby Corporal, MD;  Location: [facility];  Service:  Endoscopy;  Laterality: N/A;  930   HYSTEROSCOPY WITH D & C N/A 01/11/2021   Procedure: DILATATION AND CURETTAGE /HYSTEROSCOPY;  Surgeon: Darl Edu, MD;  Location: [facility] ORS;  Service: Gynecology;  Laterality: N/A;   KIDNEY TRANSPLANT     orif left wrist     TONSILLECTOMY     History reviewed. No pertinent family history. Social History   Socioeconomic History   Marital status: Married    Spouse name: Not on file   Number of children: Not on file   Years of education: Not on file   Highest education level: Not on file  Occupational History   Not on file  Tobacco Use   Smoking status: Former   Smokeless tobacco: Never   Tobacco comments:    quit in 1977  Vaping Use   Vaping status: Never Used  Substance and Sexual Activity   Alcohol use: No   Drug use: No   Sexual activity: Yes    Birth control/protection: Post-menopausal  Other Topics Concern   Not on file  Social History Narrative   Not on file   Social Drivers of Health   Financial Resource Strain: Low Risk  (07/16/2023)   [facility]   Overall Financial Resource Strain (CARDIA)    Difficulty of Paying Living Expenses: Not hard at all  Food Insecurity: No Food Insecurity (07/16/2023)   [facility]   Hunger Vital Sign    Worried About Running Out of Food in the Last Year: Never true    Ran Out of Food in the Last Year: Never true  Transportation Needs: No Transportation Needs (07/16/2023)   Received from [facility]   PRAPARE - Transportation    Lack of Transportation (Medical): No    Lack of Transportation (Non-Medical): No  Physical Activity: Insufficiently Active (07/11/2022)   Exercise Vital Sign    Days of Exercise per Week: 2 days    Minutes of Exercise per Session: 20 min  Stress: No Stress Concern Present (07/11/2022)   [employer] of Occupational Health - Occupational Stress Questionnaire    Feeling of Stress : Not at all  Social Connections:  Moderately Integrated (07/11/2022)   Social Connection and Isolation Panel [NHANES]    Frequency of Communication with Friends and Family: Once a week    Frequency of Social Gatherings with Friends and Family: Once a week    Attends Religious Services: More than 4 times per year    Active Member of [employer] or Organizations: No    Attends Banker Meetings: 1 to 4 times per year    Marital Status: Married  Catering manager Violence: Not At Risk (07/27/2022)   Humiliation, Afraid, Rape, and Kick questionnaire    Fear of Current or Ex-Partner: No    Emotionally Abused: No    Physically Abused: No    Sexually Abused: No    Review of Systems Constitutional: Patient  denies any unintentional weight loss or change in strength lntegumentary: Patient denies any rashes or pruritus Cardiovascular: Patient denies chest pain or syncope Respiratory: Patient denies shortness of breath Gastrointestinal: Patient denies nausea Musculoskeletal: Patient denies muscle cramps or weakness Neurologic: Patient denies convulsions or seizures Allergic/Immunologic: Patient denies recent allergic reaction(s) Hematologic/Lymphatic: Patient denies bleeding tendencies Endocrine: Patient denies heat/cold intolerance  GU: As per HPI.  OBJECTIVE Vitals:   02/14/24 1433  BP: 121/66  Pulse: 64   There is no height or weight on file to calculate BMI.  Physical Examination Constitutional: No obvious distress; patient is non-toxic appearing  Cardiovascular: No visible lower extremity edema.  Respiratory: The patient does not have audible wheezing/stridor; respirations do not appear labored  Gastrointestinal: Abdomen non-distended Musculoskeletal: Normal ROM of UEs  Skin: No obvious rashes/open sores  Neurologic: CN 2-12 grossly intact Psychiatric: Answered questions appropriately with normal affect  Hematologic/Lymphatic/Immunologic: No obvious bruises or sites of spontaneous bleeding  Urine  microscopy: 6-10 WBC/hpf, 0-2 RBC/hpf, 2+ bacteria PVR: 0 ml  ASSESSMENT OAB (overactive bladder) - Plan: Urinalysis, Routine w reflex microscopic, BLADDER SCAN AMB NON-IMAGING  Recurrent UTI  Vaginal atrophy  Urge incontinence  KIDNEY TRANSPLANTATION  We reviewed reasonable expectations for treatment success - per review of initial visit notes her symptoms are much improved since starting Myrbetriq  and timed voiding once per night. Offered urodynamic testing as previously noted as next step by Dr. Inga Manges in his note on 10/11/2023; they elected to hold off on that pending evaluation by Urogynecology. In the interim will trial taking Myrbetriq  earlier between 5-6pm. Reviewed recommendation to minimize fluid intake for 2-3 hours preceding nap and bedtime. We agreed to plan for follow up with Dr. Inga Manges about 1 month after Urogynecology appointment. Patient / husband verbalized understanding of and agreement with current plan. All questions were answered.  PLAN Advised the following: 1. Continue Myrbetriq  50 mg nightly. 2. Continue overnight timed voiding x1.  3. Continue to avoid caffeine.  4. Minimize fluid intake for 2-3 hours preceding nap and bedtime.  5. [facility] appointment on 04/07/2024. 6. Return in about 3 months (around 05/05/2024) for f/u with Dr. Inga Manges.  Orders Placed This Encounter  Procedures   Urinalysis, Routine w reflex microscopic   BLADDER SCAN AMB NON-IMAGING    It has been explained that the patient is to follow regularly with their PCP in addition to all other providers involved in their care and to follow instructions provided by these respective offices. Patient advised to contact urology clinic if any urologic-pertaining questions, concerns, new symptoms or problems arise in the interim period.  There are no Patient Instructions on file for this visit.  Electronically signed by:  Lauretta Ponto, FNP   02/14/24    3:18 PM

## 2024-02-15 LAB — MICROSCOPIC EXAMINATION

## 2024-02-15 LAB — URINALYSIS, ROUTINE W REFLEX MICROSCOPIC
Bilirubin, UA: NEGATIVE
Glucose, UA: NEGATIVE
Ketones, UA: NEGATIVE
Nitrite, UA: NEGATIVE
Protein,UA: NEGATIVE
Specific Gravity, UA: 1.025 (ref 1.005–1.030)
Urobilinogen, Ur: 2 mg/dL — ABNORMAL HIGH (ref 0.2–1.0)
pH, UA: 6 (ref 5.0–7.5)

## 2024-02-19 DIAGNOSIS — B351 Tinea unguium: Secondary | ICD-10-CM | POA: Diagnosis not present

## 2024-02-19 DIAGNOSIS — I739 Peripheral vascular disease, unspecified: Secondary | ICD-10-CM | POA: Diagnosis not present

## 2024-02-21 DIAGNOSIS — R410 Disorientation, unspecified: Secondary | ICD-10-CM | POA: Diagnosis not present

## 2024-02-21 DIAGNOSIS — N390 Urinary tract infection, site not specified: Secondary | ICD-10-CM | POA: Diagnosis not present

## 2024-02-21 DIAGNOSIS — Z6820 Body mass index (BMI) 20.0-20.9, adult: Secondary | ICD-10-CM | POA: Diagnosis not present

## 2024-02-21 DIAGNOSIS — R030 Elevated blood-pressure reading, without diagnosis of hypertension: Secondary | ICD-10-CM | POA: Diagnosis not present

## 2024-02-22 DIAGNOSIS — I10 Essential (primary) hypertension: Secondary | ICD-10-CM | POA: Diagnosis not present

## 2024-02-22 DIAGNOSIS — N390 Urinary tract infection, site not specified: Secondary | ICD-10-CM | POA: Diagnosis not present

## 2024-03-05 DIAGNOSIS — F0150 Vascular dementia without behavioral disturbance: Secondary | ICD-10-CM | POA: Diagnosis not present

## 2024-03-05 DIAGNOSIS — R32 Unspecified urinary incontinence: Secondary | ICD-10-CM | POA: Diagnosis not present

## 2024-03-05 DIAGNOSIS — I10 Essential (primary) hypertension: Secondary | ICD-10-CM | POA: Diagnosis not present

## 2024-03-12 DIAGNOSIS — R32 Unspecified urinary incontinence: Secondary | ICD-10-CM | POA: Diagnosis not present

## 2024-03-12 DIAGNOSIS — Z940 Kidney transplant status: Secondary | ICD-10-CM | POA: Diagnosis not present

## 2024-03-12 DIAGNOSIS — E785 Hyperlipidemia, unspecified: Secondary | ICD-10-CM | POA: Diagnosis not present

## 2024-03-12 DIAGNOSIS — I10 Essential (primary) hypertension: Secondary | ICD-10-CM | POA: Diagnosis not present

## 2024-03-12 DIAGNOSIS — M1711 Unilateral primary osteoarthritis, right knee: Secondary | ICD-10-CM | POA: Diagnosis not present

## 2024-03-12 DIAGNOSIS — I350 Nonrheumatic aortic (valve) stenosis: Secondary | ICD-10-CM | POA: Diagnosis not present

## 2024-03-12 DIAGNOSIS — F0150 Vascular dementia without behavioral disturbance: Secondary | ICD-10-CM | POA: Diagnosis not present

## 2024-03-19 DIAGNOSIS — Z940 Kidney transplant status: Secondary | ICD-10-CM | POA: Diagnosis not present

## 2024-04-05 DIAGNOSIS — I6203 Nontraumatic chronic subdural hemorrhage: Secondary | ICD-10-CM | POA: Diagnosis not present

## 2024-04-05 DIAGNOSIS — I6782 Cerebral ischemia: Secondary | ICD-10-CM | POA: Diagnosis not present

## 2024-04-05 DIAGNOSIS — G912 (Idiopathic) normal pressure hydrocephalus: Secondary | ICD-10-CM | POA: Diagnosis not present

## 2024-04-07 ENCOUNTER — Encounter: Payer: Self-pay | Admitting: Obstetrics and Gynecology

## 2024-04-07 ENCOUNTER — Ambulatory Visit (INDEPENDENT_AMBULATORY_CARE_PROVIDER_SITE_OTHER): Payer: PPO | Admitting: Obstetrics and Gynecology

## 2024-04-07 ENCOUNTER — Other Ambulatory Visit (HOSPITAL_COMMUNITY)
Admission: RE | Admit: 2024-04-07 | Discharge: 2024-04-07 | Disposition: A | Discharge status: Home / Self Care | Source: Other Acute Inpatient Hospital | Attending: Obstetrics and Gynecology | Admitting: Obstetrics and Gynecology

## 2024-04-07 VITALS — BP 107/61 | HR 67 | Ht 61.0 in | Wt 105.6 lb

## 2024-04-07 DIAGNOSIS — R351 Nocturia: Secondary | ICD-10-CM

## 2024-04-07 DIAGNOSIS — R319 Hematuria, unspecified: Secondary | ICD-10-CM

## 2024-04-07 DIAGNOSIS — R82998 Other abnormal findings in urine: Secondary | ICD-10-CM | POA: Diagnosis not present

## 2024-04-07 DIAGNOSIS — N393 Stress incontinence (female) (male): Secondary | ICD-10-CM

## 2024-04-07 DIAGNOSIS — R350 Frequency of micturition: Secondary | ICD-10-CM

## 2024-04-07 LAB — URINALYSIS, ROUTINE W REFLEX MICROSCOPIC
Bilirubin Urine: NEGATIVE
Glucose, UA: NEGATIVE mg/dL
Ketones, ur: NEGATIVE mg/dL
Nitrite: NEGATIVE
Protein, ur: NEGATIVE mg/dL
Specific Gravity, Urine: 1.02 (ref 1.005–1.030)
pH: 5 (ref 5.0–8.0)

## 2024-04-07 LAB — POCT URINALYSIS DIP (CLINITEK)
Bilirubin, UA: NEGATIVE
Glucose, UA: NEGATIVE mg/dL
Ketones, POC UA: NEGATIVE mg/dL
Nitrite, UA: NEGATIVE
POC PROTEIN,UA: NEGATIVE
Spec Grav, UA: 1.025 (ref 1.010–1.025)
Urobilinogen, UA: 0.2 U/dL
pH, UA: 5.5 (ref 5.0–8.0)

## 2024-04-07 NOTE — Patient Instructions (Addendum)
 For the stress leakage consider the option of urethral bulking for the leakage with cough and sneeze and some of the nighttime leakage. We could also consider a pessary (the diaphragm like device) to support the urethra.   We can also consider doing the needle in the ankle (PTNS) for some of the bladder spasms and see if this helps the overactiveness.   Today we talked about ways to manage bladder urgency such as altering your diet to avoid irritative beverages and foods (bladder diet) as well as attempting to decrease stress and other exacerbating factors.  You can also chew a plain Tums 1-3 times per day to make your urine less acidic, especially if you have eating/drinking acidic things.    The Most Bothersome Foods* The Least Bothersome Foods*  Coffee - Regular & Decaf Tea - caffeinated Carbonated beverages - cola, non-colas, diet & caffeine-free Alcohols - Beer, Red Wine, White Wine, Champagne Fruits - Grapefruit, [place], Orange, [employer] - Cranberry, Grapefruit, Orange, Pineapple Vegetables - Tomato & Tomato Products Flavor Enhancers - Hot peppers, Spicy foods, Chili, Horseradish, Vinegar, Monosodium glutamate (MSG) Artificial Sweeteners - NutraSweet, Sweet 'N Low, Equal (sweetener), Saccharin Ethnic foods - [place], [place], [place] food Water  Milk - low-fat & whole Fruits - Bananas, Blueberries, Honey[place]w melon, Pears, Raisins, Watermelon Vegetables - Broccoli, [address] Sprouts, [place], [place], Cauliflower, [place], Cucumber, Mushrooms, Peas, Radishes, Squash, Zucchini, White potatoes, Sweet potatoes & yams Poultry - Chicken, Eggs, [place], [employer] - Beef, Diplomatic Services operational officer, Lamb Seafood - Shrimp, [place] fish, Salmon Grains - Oat, Rice Snacks - Pretzels, Popcorn  *Theodosia Fishman et al. Diet and its role in interstitial cystitis/bladder pain syndrome (IC/BPS) and comorbid conditions. [employer]. BJU Int. 2012 Jan 11.     Please keep us  updated on what the

## 2024-04-07 NOTE — Progress Notes (Signed)
 [facility] New Patient Evaluation and Consultation  Referring Provider: Artemisa Bile, MD PCP: Artemisa Bile, MD Date of Service: 04/07/2024  SUBJECTIVE Chief Complaint: New Patient (Initial Visit) Kristine [name] is a 83 y.o. female is here for incontinence.)  History of Present Illness: Kristine [name] is a 83 y.o. White or Caucasian female seen in consultation at the request of Dr. Hildy Lowers for evaluation of incontinence.    Review of records significant for: Has tried Myrbetriq  50mg . Has failed Vesicare  and Gemtesa . 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  Kidney Transplant in 2009  EXAM: US  RENAL TRANSPLANT W NATIVE KIDNEYS  ACCESSION: [id number]  REPORT DATE: 11/23/2023 3:51 PM    CLINICAL INDICATION: 83 years old with kidney transplant  - Z94.0 - Kidney transplant recipient   COMPARISON: Renal transplant ultrasound dated 11/01/2022   TECHNIQUE:  Ultrasound views of the renal transplant were obtained using gray scale and color and spectral Doppler imaging. Views of the native kidneys and urinary bladder were obtained using gray scale and limited color Doppler imaging.   FINDINGS:   NATIVE KIDNEYS: The native kidneys are small and echogenic. No solid masses or calculi. Cyst in the upper pole of the native left kidney measuring up to 1.0 cm. No hydronephrosis.   TRANSPLANTED KIDNEY: The renal transplant was located in the left lower quadrant. Normal size and echogenicity.  No solid masses or calculi. No perinephric collections identified. No hydronephrosis.   VESSELS:  - Perfusion: Using power Doppler, normal perfusion was seen throughout the renal parenchyma.  - Resistive indices in the renal transplant are stable compared with prior examination.  - Main renal artery/iliac artery: Patent  - Main renal vein/iliac vein: Patent   BLADDER: Unremarkable.   Urinary Symptoms: Leaks urine with while asleep Leaks >3 time(s) per days.   Pad use: 3 adult diapers per day.   Patient is bothered by UI symptoms.  Day time voids 4.  Nocturia: 2 times per night to void. Voiding dysfunction:  empties bladder well.  Patient does not use a catheter to empty bladder.  When urinating, patient feels a weak stream Drinks: Gingerale, Decaf coffee, 1-2 cups of water  per day  UTIs: 2 UTI's in the last year.   Denies history of blood in urine, kidney or bladder stones, pyelonephritis, bladder cancer, and kidney cancer No results found for the last 90 days.   Pelvic Organ Prolapse Symptoms:                  Patient Denies a feeling of a bulge the vaginal area.   Bowel Symptom: Bowel movements: 1 time(s) per day Stool consistency: soft  Straining: no.  Splinting: no.  Incomplete evacuation: no.  Patient Admits to accidental bowel leakage / fecal incontinence  Occurs: occasional time(s) per month  Consistency with leakage: soft  or liquid Bowel regimen: none Last colonoscopy: Date 2021, Results Negative [facility] Colonoscopy   This patient has no relevant Health Maintenance data.     Sexual Function Sexually active: no.  Sexual orientation: Straight Pain with sex: No  Pelvic Pain Denies pelvic pain   Past Medical History:  Past Medical History:  Diagnosis Date   Chronic kidney disease    had kidney transplant   Vascular dementia without behavioral disturbance [facility])      Past Surgical History:   Past Surgical History:  Procedure Laterality Date   BRAIN SURGERY  2024   CHOLECYSTECTOMY     COLONOSCOPY  08/28/2012   Procedure: COLONOSCOPY;  Surgeon: Ruby Corporal, MD;  [facility];  Service: Endoscopy;  Laterality: N/A;  930   COLONOSCOPY WITH PROPOFOL  N/A 07/14/2020   Procedure: COLONOSCOPY WITH PROPOFOL ;  Surgeon: Ruby Corporal, MD;  [facility];  Service: Endoscopy;  Laterality: N/A;  930   HYSTEROSCOPY WITH D & C N/A 01/11/2021   Procedure: DILATATION AND CURETTAGE /HYSTEROSCOPY;   Surgeon: Darl Edu, MD;  Location: [facility] ORS;  Service: Gynecology;  Laterality: N/A;   KIDNEY TRANSPLANT     orif left wrist     TONSILLECTOMY       Past OB/GYN History: [id number] Menopausal: Yes, at age 73 Contraception: None. Last pap smear was unknown.  Any history of abnormal pap smears: no. HM PAP   This patient has no relevant Health Maintenance data.     Medications: Patient has a current medication list which includes the following prescription(s): amlodipine , amlodipine , vitamin d, donepezil , mirabegron  er, mycophenolate , polyethylene glycol, protein, simvastatin , and tacrolimus .   Allergies: Patient has no known allergies.   Social History:  Social History   Tobacco Use   Smoking status: Former   Smokeless tobacco: Never   Tobacco comments:    quit in 1977  Vaping Use   Vaping status: Never Used  Substance Use Topics   Alcohol use: No   Drug use: No    Relationship status: married Patient lives with husband.   Patient is not employed. Regular exercise: No History of abuse: No  Family History:   Family History  Problem Relation Age of Onset   Stroke Brother      Review of Systems: Review of Systems  Constitutional:  Negative for chills and fever.  Respiratory:  Negative for cough and shortness of breath.   Cardiovascular:  Negative for chest pain and palpitations.  Gastrointestinal:  Negative for abdominal pain, blood in stool, constipation and diarrhea.  Skin:  Negative for rash.  Neurological:  Positive for dizziness. Negative for weakness.       +Memory issues  Psychiatric/Behavioral:  Negative for depression and suicidal ideas.      OBJECTIVE Physical Exam: Vitals:   04/07/24 0951  BP: 107/61  Pulse: 67  Weight: 105 lb 9.6 oz (47.9 kg)  Height: 5' 1 (1.549 m)    Physical Exam Vitals reviewed. Exam conducted with a chaperone present.  Constitutional:      Appearance: Normal appearance.  Pulmonary:     Effort: Pulmonary  effort is normal.  Abdominal:     Palpations: Abdomen is soft.   Neurological:     General: No focal deficit present.     Mental Status: She is alert and oriented to person, place, and time.   Psychiatric:        Mood and Affect: Mood normal.        Behavior: Behavior normal. Behavior is cooperative.        Thought Content: Thought content normal.      GU / Detailed Urogynecologic Evaluation:  Pelvic Exam: Normal external female genitalia; Bartholin's and Skene's glands normal in appearance; urethral meatus normal in appearance, no urethral masses or discharge.   CST: positive with movement shifting down and with cough  Speculum exam reveals normal vaginal mucosa with atrophy. Cervix normal appearance. Uterus normal single, nontender. Adnexa normal adnexa.    With apex supported, anterior compartment defect was reduced  Pelvic floor strength I/V  Pelvic floor musculature: Right levator non-tender, Right obturator non-tender, Left levator  non-tender, Left obturator non-tender  POP-Q:   POP-Q  -2                                            Aa   -2                                           Ba  -6                                              C   2.5                                            Gh  3                                            Pb  8                                            tvl   -3                                            Ap  -3                                            Bp  -7                                              D      Rectal Exam:  Normal external exam  Post-Void Residual (PVR) by Bladder Scan: In order to evaluate bladder emptying, we discussed obtaining a postvoid residual and patient agreed to this procedure.  Procedure: The ultrasound unit was placed on the patient's abdomen in the suprapubic region after the patient had voided.    Post Void Residual - 04/07/24 1104       Post Void Residual   Post Void Residual 22 mL            Laboratory Results: Lab Results  Component Value Date   COLORU yellow 04/07/2024   CLARITYU cloudy (A) 04/07/2024   GLUCOSEUR negative 04/07/2024   BILIRUBINUR negative 04/07/2024   KETONESU Negative 02/14/2024   SPECGRAV 1.025 04/07/2024   RBCUR small (A) 04/07/2024   PHUR 5.5 04/07/2024   PROTEINUR Negative 02/14/2024   UROBILINOGEN 0.2 04/07/2024   LEUKOCYTESUR Small (1+) (A) 04/07/2024    Lab Results  Component Value Date   CREATININE  0.57 07/10/2023   CREATININE 0.52 07/29/2022   CREATININE 0.70 07/28/2022    No results found for: HGBA1C  Lab Results  Component Value Date   HGB 15.1 (H) 07/10/2023     ASSESSMENT AND PLAN Ms. Slaby is a 83 y.o. with:  1. Urinary frequency   2. Nocturia   3. SUI (stress urinary incontinence, female)   4. Leukocytes in urine   5. Hematuria, unspecified type    Patient's main concern is loss of urine. Per patient's husband, patient sometimes has loss of urine when she stands up. Questions rather this is a terminal DO. He also reports they have to get up multiple times at night to change her brief which is interrupts her sleep and his sleep. They are beginning a workup for Normal Pressure Hydrocephalus so we can wait until she is finished ruling out this as a causative agent before proceeding with further treatment. Can continue Myrbetriq  50mg  daily. We discussed options of PTNS and Bladder botox today. I would lean more towards PTNS than bladder botox due to the risk of retention and that her husband would have to assist with in and out catheterization. They are considering the option of PTNS.  Patient had +CST on exam today and some mild leakage when shifting position. There is potential she could be having considerate leakage related to this with movement in her sleep as well. We discussed Urethral bulking as an option we could do in the office to see if this is helpful for SUI leakage that could also be impacting loss  of urine with sleep. They are considering this.  We also discussed we could do a pessary placement that would apply pressure under the urethra and support the urethra from leaking. This may be more cumbersome as it relates to her memory issues and her having to come every 3 months for maintenance. They feel like they would more likely try the bulking but will wait until after seeing neurology before making a decision.  Will send for culture Will send for micro  Handouts given on both PTNS and Urethral Bulking to consider. Patient will call once they have gon through workup with Neurology for [facility].    Kristine Rieves G Melynda Krzywicki, NP

## 2024-04-09 LAB — URINE CULTURE: Culture: >=100000 — AB

## 2024-04-10 ENCOUNTER — Ambulatory Visit: Payer: Self-pay | Admitting: Obstetrics and Gynecology

## 2024-04-10 ENCOUNTER — Other Ambulatory Visit: Payer: Self-pay | Admitting: Obstetrics and Gynecology

## 2024-04-10 DIAGNOSIS — N390 Urinary tract infection, site not specified: Secondary | ICD-10-CM

## 2024-04-10 MED ORDER — SULFAMETHOXAZOLE-TRIMETHOPRIM 800-160 MG PO TABS: 1.0000 | ORAL_TABLET | Freq: Two times a day (BID) | ORAL | 0 refills | Status: AC

## 2024-04-10 NOTE — Progress Notes (Signed)
 Patient has a UTI. Bactrim sent into the pharmacy based on culture.

## 2024-04-14 DIAGNOSIS — S065XAA Traumatic subdural hemorrhage with loss of consciousness status unknown, initial encounter: Secondary | ICD-10-CM | POA: Diagnosis not present

## 2024-04-14 DIAGNOSIS — X58XXXA Exposure to other specified factors, initial encounter: Secondary | ICD-10-CM | POA: Diagnosis not present

## 2024-04-14 DIAGNOSIS — G912 (Idiopathic) normal pressure hydrocephalus: Secondary | ICD-10-CM | POA: Diagnosis not present

## 2024-04-15 DIAGNOSIS — Z940 Kidney transplant status: Secondary | ICD-10-CM | POA: Diagnosis not present

## 2024-04-29 DIAGNOSIS — I739 Peripheral vascular disease, unspecified: Secondary | ICD-10-CM | POA: Diagnosis not present

## 2024-04-29 DIAGNOSIS — B351 Tinea unguium: Secondary | ICD-10-CM | POA: Diagnosis not present

## 2024-05-08 DIAGNOSIS — F0150 Vascular dementia without behavioral disturbance: Secondary | ICD-10-CM | POA: Diagnosis not present

## 2024-05-08 DIAGNOSIS — G912 (Idiopathic) normal pressure hydrocephalus: Secondary | ICD-10-CM | POA: Diagnosis not present

## 2024-05-08 DIAGNOSIS — I10 Essential (primary) hypertension: Secondary | ICD-10-CM | POA: Diagnosis not present

## 2024-05-10 DIAGNOSIS — R410 Disorientation, unspecified: Secondary | ICD-10-CM | POA: Diagnosis not present

## 2024-05-10 DIAGNOSIS — N390 Urinary tract infection, site not specified: Secondary | ICD-10-CM | POA: Diagnosis not present

## 2024-05-10 DIAGNOSIS — G912 (Idiopathic) normal pressure hydrocephalus: Secondary | ICD-10-CM | POA: Diagnosis not present

## 2024-05-10 DIAGNOSIS — N3001 Acute cystitis with hematuria: Secondary | ICD-10-CM | POA: Diagnosis not present

## 2024-05-15 DIAGNOSIS — Z940 Kidney transplant status: Secondary | ICD-10-CM | POA: Diagnosis not present

## 2024-05-19 DIAGNOSIS — G912 (Idiopathic) normal pressure hydrocephalus: Secondary | ICD-10-CM | POA: Diagnosis not present

## 2024-05-26 ENCOUNTER — Ambulatory Visit (INDEPENDENT_AMBULATORY_CARE_PROVIDER_SITE_OTHER): Admitting: Obstetrics and Gynecology

## 2024-05-26 ENCOUNTER — Encounter: Payer: Self-pay | Admitting: Obstetrics and Gynecology

## 2024-05-26 VITALS — BP 139/70 | HR 57

## 2024-05-26 DIAGNOSIS — N393 Stress incontinence (female) (male): Secondary | ICD-10-CM | POA: Diagnosis not present

## 2024-05-26 DIAGNOSIS — R351 Nocturia: Secondary | ICD-10-CM

## 2024-05-26 DIAGNOSIS — R350 Frequency of micturition: Secondary | ICD-10-CM

## 2024-05-26 NOTE — Progress Notes (Signed)
 [facility] Return Visit  SUBJECTIVE  History of Present Illness: Kristine [name] is a 83 y.o. female seen in follow-up for OAB/SUI. Plan at last visit was consider options for treatment of OAB and SUI.    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   Still having significant leakage when changing positions, especially at nighttime.   Is open to trying PTNS but unsure about time considerations.    Past Medical History: Patient  has a past medical history of Chronic kidney disease and Vascular dementia without behavioral disturbance (HCC).   Past Surgical History: She  has a past surgical history that includes Kidney transplant; Tonsillectomy; Cholecystectomy; orif left wrist; Colonoscopy (08/28/2012); Colonoscopy with propofol  (N/A, 07/14/2020); Hysteroscopy with D & C (N/A, 01/11/2021); and Brain surgery (2024).   Medications: She has a current medication list which includes the following prescription(s): amlodipine , amlodipine , vitamin d, donepezil , mirabegron  er, mycophenolate , polyethylene glycol, protein, simvastatin , and tacrolimus .   Allergies: Patient has no known allergies.   Social History: Patient  reports that she has quit smoking. She has never used smokeless tobacco. She reports that she does not drink alcohol and does not use drugs.     OBJECTIVE     PTNS VISIT  CC:  Overactive bladder  83 y.o. with refractory overactive bladder who presents for percutaneous tibial nerve stimulation. The patient presents for PTNS session # 1.   Procedure: The patient was placed in the sitting position and the left lower extremity was prepped in the usual fashion. The PTNS needle was then inserted at a 60 degree angle, 5 cm cephalad and 2 cm posterior to the medial malleolus. The PTNS unit was then programmed and an optimal response was noted at 12 milliamps. The PTNS stimulation was then performed at this setting for 30 minutes without  incident and the patient tolerated the procedure well. The needle was removed and hemostasis was noted.     Physical Exam: Vitals:   05/26/24 1310  BP: 139/70  Pulse: (!) 57   Gen: No apparent distress, A&O x 3.  Detailed Urogynecologic Evaluation:  Deferred.    ASSESSMENT AND PLAN    Kristine [name] is a 83 y.o. with:  1. SUI (stress urinary incontinence, female)   2. Nocturia   3. Urinary frequency    Patient is interested in urethral bulking. She is going to have her shunt placed in 16 days and her husband wants to see what that does for her. If her symptoms are not improved with the shunt placement will plan for urethral bulking.  Patient has not been worked up for sleep apnea, but her husband does report her gasping occasionally at night. We discussed this could be a large contributor to her nighttime leakage.  Will see if there is improvement after shunt placement. PTNS done today to see if there could be utility in this therapy. Patient tolerated procedure well and had good toe response.   Patient to plan to return for urethral bulking if needed after shunt placement.    Aldina Porta G Jodee Wagenaar, NP

## 2024-06-10 DIAGNOSIS — G912 (Idiopathic) normal pressure hydrocephalus: Secondary | ICD-10-CM | POA: Diagnosis not present

## 2024-06-10 DIAGNOSIS — Z87891 Personal history of nicotine dependence: Secondary | ICD-10-CM | POA: Diagnosis not present

## 2024-06-10 DIAGNOSIS — I10 Essential (primary) hypertension: Secondary | ICD-10-CM | POA: Diagnosis not present

## 2024-06-10 DIAGNOSIS — I350 Nonrheumatic aortic (valve) stenosis: Secondary | ICD-10-CM | POA: Diagnosis not present

## 2024-06-10 DIAGNOSIS — Z940 Kidney transplant status: Secondary | ICD-10-CM | POA: Diagnosis not present

## 2024-06-10 DIAGNOSIS — Z01818 Encounter for other preprocedural examination: Secondary | ICD-10-CM | POA: Diagnosis not present

## 2024-06-11 DIAGNOSIS — Z982 Presence of cerebrospinal fluid drainage device: Secondary | ICD-10-CM | POA: Diagnosis not present

## 2024-06-11 DIAGNOSIS — I10 Essential (primary) hypertension: Secondary | ICD-10-CM | POA: Diagnosis not present

## 2024-06-11 DIAGNOSIS — Z7982 Long term (current) use of aspirin: Secondary | ICD-10-CM | POA: Diagnosis not present

## 2024-06-11 DIAGNOSIS — Z79899 Other long term (current) drug therapy: Secondary | ICD-10-CM | POA: Diagnosis not present

## 2024-06-11 DIAGNOSIS — E785 Hyperlipidemia, unspecified: Secondary | ICD-10-CM | POA: Diagnosis not present

## 2024-06-11 DIAGNOSIS — N3281 Overactive bladder: Secondary | ICD-10-CM | POA: Diagnosis not present

## 2024-06-11 DIAGNOSIS — Z940 Kidney transplant status: Secondary | ICD-10-CM | POA: Diagnosis not present

## 2024-06-11 DIAGNOSIS — G912 (Idiopathic) normal pressure hydrocephalus: Secondary | ICD-10-CM | POA: Diagnosis not present

## 2024-06-11 DIAGNOSIS — M47817 Spondylosis without myelopathy or radiculopathy, lumbosacral region: Secondary | ICD-10-CM | POA: Diagnosis not present

## 2024-06-11 DIAGNOSIS — I350 Nonrheumatic aortic (valve) stenosis: Secondary | ICD-10-CM | POA: Diagnosis not present

## 2024-06-12 NOTE — Progress Notes (Signed)
 Case Manager Discharge Summary / Closing Note  Expected Discharge Date & Time: 06/12/2024 at   Discharge Plan:  The patient has been involved in the development of this plan and is in agreement., Patient is discharging home with routine care and no post-acute services were indicated prior to discharge.    Post-Acute Services Coordinated: No resources indicated at this time  Funding for Discharge Medications: Drug assistance not needed    Transportation: Arrangements: patient arranged Service Arranged: private vehicle  Final ADT:  Final ADT Discharge Disposition: Home Based  Home Based: Home or Self Care                 Second IMM Received: Not indicated (06/12/24 1212)  Final Summary: Patient is scheduled to discharge home today. No discharge needs identified at this time.    [facility]  T[facility]NER

## 2024-06-13 ENCOUNTER — Telehealth: Payer: Self-pay

## 2024-06-13 NOTE — Transitions of Care (Post Inpatient/ED Visit) (Signed)
 06/13/2024  Name: Kristine [name] MRN: [id number] DOB: 1941/02/04  Today's TOC FU Call Status: Today's TOC FU Call Status:: Successful TOC FU Call Completed TOC FU Call Complete Date: 06/13/24 Patient's Name and Date of Birth confirmed.  Transition Care Management Follow-up Telephone Call Date of Discharge: 06/12/24 Discharge Facility: Other [facility]) Name of Other (Non[facility]) Discharge Facility: [facility] Type of Discharge: Inpatient Admission Primary Inpatient Discharge Diagnosis:: Normal pressure hydrocephalus with LUMBAR PERITONEAL SHUNT placement How have you been since you were released from the hospital?: Better Any questions or concerns?: No  Items Reviewed: Did you receive and understand the discharge instructions provided?: Yes Medications obtained,verified, and reconciled?: Yes (Medications Reviewed) Any new allergies since your discharge?: No Dietary orders reviewed?: Yes Type of Diet Ordered:: Heart Healthy low sodium Do you have support at home?: Yes People in Home [RPT]: spouse Name of Support/Comfort Primary Source: [place] primary caregiver  Medications Reviewed Today: Medications Reviewed Today     Reviewed by Terren Jandreau, RN (Case Manager) on 06/13/24 at 1441  Med List Status: <None>   Medication Order Taking? Sig Documenting Provider Last Dose Status Informant  amLODipine  (NORVASC ) 2.5 MG tablet [id number]  Take 1 tablet (2.5 mg total) by mouth at bedtime.  Patient not taking: Reported on 06/13/2024   Landy Barnie RAMAN, NP  Active Self  amLODipine  (NORVASC ) 5 MG tablet [id number] Yes Take 5 mg by mouth daily. [provider]  Active   Cholecalciferol (VITAMIN D) 2000 UNITS CAPS [id number] Yes Take 2,000 Units by mouth daily.  [provider]  Active Pharmacy Records, Other, Spouse/Significant Other  donepezil  (ARICEPT ) 10 MG tablet [id number] Yes Take 1 tablet (10 mg total) by mouth daily.  Patient taking differently: Take 5 mg by  mouth daily.   Landy Barnie RAMAN, NP  Active   mirabegron  ER (MYRBETRIQ ) 50 MG TB24 tablet [id number] Yes Take 1 tablet (50 mg total) by mouth daily. Gerldine Lauraine BROCKS, FNP  Active   mycophenolate  (MYFORTIC ) 180 MG EC tablet [id number] Yes Take 3 tablets (540 mg total) by mouth 2 (two) times daily. Landy Barnie RAMAN, NP  Active            Med Note DOMENICK, [facility] R   Thu Jan 03, 2024  2:39 PM) Take two tablet 360mg   polyethylene glycol (MIRALAX  / GLYCOLAX ) 17 g packet [id number] Yes Take 17 g by mouth daily. [provider]  Active            Med Note DOMENICK, [facility] R   Thu Jan 03, 2024  2:39 PM) PRN  Protein (PROSOURCE PO) [id number]  Take 30 mLs by mouth 3 (three) times daily.  Patient not taking: Reported on 06/13/2024   [provider]  Active   simvastatin  (ZOCOR ) 20 MG tablet [id number] Yes Take 1 tablet (20 mg total) by mouth at bedtime.  Patient taking differently: Take 40 mg by mouth at bedtime.   Landy Barnie RAMAN, NP  Active   tacrolimus  (PROGRAF ) 0.5 MG capsule [id number] Yes Take 1 capsule (0.5 mg total) by mouth 2 (two) times daily.  Patient taking differently: Take 0.5 mg by mouth 2 (two) times daily. 2 capsules in the morning and one in the evening   Landy Barnie RAMAN, NP  Active             Home Care and Equipment/Supplies: Were Home Health Services Ordered?: NA Any new equipment or medical supplies ordered?: NA  Functional Questionnaire: Do you need assistance with  bathing/showering or dressing?: Yes (spouse assists as needed) Do you need assistance with meal preparation?: Yes (spouse does meals) Do you need assistance with eating?: No Do you have difficulty maintaining continence: No Do you need assistance with getting out of bed/getting out of a chair/moving?: No Do you have difficulty managing or taking your medications?: Yes (spouse manages medications)  Follow up appointments reviewed: PCP Follow-up appointment confirmed?: No (spouse states he  will call Monday as office closed) MD Provider Line [phone] Given: No Specialist Hospital Follow-up appointment confirmed?: Yes Date of Specialist follow-up appointment?: 06/18/24 Follow-Up Specialty Provider:: Elvie Cedar Do you need transportation to your follow-up appointment?: No Do you understand care options if your condition(s) worsen?: Yes-patient verbalized understanding  SDOH Interventions Today    Flowsheet Row Most Recent Value  SDOH Interventions   Food Insecurity Interventions Intervention Not Indicated  Housing Interventions Intervention Not Indicated  Transportation Interventions Intervention Not Indicated  Utilities Interventions Intervention Not Indicated    Dasani Thurlow J. Trindon Dorton RN, MSN [facility]  [facility], [facility] RN Care Manager Direct Dial: [phone]  Fax: [phone] Website: [email or web address]

## 2024-06-13 NOTE — Patient Instructions (Signed)
 Visit Information  Thank you for taking time to visit with me today. Please don't hesitate to contact me if I can be of assistance to you before our next scheduled telephone appointment.  Our next appointment is by telephone on 06/20/24 at 1030 am  Following is a copy of your care plan:   Goals Addressed             This Visit's Progress    VBCI Transitions of Care (TOC) Care Plan       Problems:  Recent Hospitalization for treatment of Normal Pressure Hydrocephalus Knowledge Deficit Related to Normal pressure hydrocephalus with Lumbar Peritoneal Shunt placement No PCP appointment- spouse to call on Monday to schedule   Goal:  Over the next 30 days, the patient will not experience hospital readmission  Interventions:  Transitions of Care: Doctor Visits  - discussed the importance of doctor visits Post-op wound/incision care reviewed with patient/caregiver Reviewed Signs and symptoms of infection  Spouse provides care for patient dressing intact to back per spouse.  Dressing to be removed on 8/23. He states that staples to be removed in 12 weeks.  He will call PCP to schedule this and follow up.   Advised spouse to call physician for:  Nausea or Vomiting, Temperature is greater than 101.32F (38.1C) degrees, Dizziness, Abdominal Pain, Difficulty Breathing or Shortness of Breath, Inability to Eat, drink Fluids, or Take medications, Bleeding, swelling, or drainage from surgical incision sites, New numbness or weakness, Seizures, Altered mental status, and Bowel or bladder dysfunction to your neurosurgery team. During regular business hours, reach out to the clinic for a quicker response, at the number listed below. If you have not gotten in touch with them after two hours, and it is not life threatening, call the hospital at [phone] and ask for the neurosurgery resident on call. If it is life threatening, please call 911 or go to your local ER.    Patient Self Care Activities:   Attend all scheduled provider appointments Call pharmacy for medication refills 3-7 days in advance of running out of medications Call provider office for new concerns or questions  Notify RN Care Manager of TOC call rescheduling needs Participate in Transition of Care Program/Attend TOC scheduled calls Take medications as prescribed    Plan:  The patient has been provided with contact information for the care management team and has been advised to call with any health related questions or concerns.         The patient verbalized understanding of instructions, educational materials, and care plan provided today and DECLINED offer to receive copy of patient instructions, educational materials, and care plan.   The patient has been provided with contact information for the care management team and has been advised to call with any health related questions or concerns.   Please call the care guide team at [phone] if you need to cancel or reschedule your appointment.   Please call the Suicide and Crisis Lifeline: 988 if you are experiencing a Mental Health or Behavioral Health Crisis or need someone to talk to.  Isahia Hollerbach J. Alice Burnside RN, MSN [facility], [facility] RN Care Manager Direct Dial: [phone]  Fax: [phone] Website: [email or web address]

## 2024-06-18 ENCOUNTER — Encounter: Payer: Self-pay | Admitting: Emergency Medicine

## 2024-06-18 ENCOUNTER — Ambulatory Visit
Admission: EM | Admit: 2024-06-18 | Discharge: 2024-06-18 | Disposition: A | Discharge status: Home / Self Care | Attending: Family Medicine | Admitting: Family Medicine

## 2024-06-18 DIAGNOSIS — H6123 Impacted cerumen, bilateral: Secondary | ICD-10-CM

## 2024-06-18 DIAGNOSIS — H9193 Unspecified hearing loss, bilateral: Secondary | ICD-10-CM

## 2024-06-18 NOTE — ED Triage Notes (Signed)
 Had a shunt put in spine last week and has not been able to hear well since the surgery.  Surgery was done at [facility].  Her surgeon's office told her to come to urgent care to have someone check her ears.

## 2024-06-18 NOTE — Discharge Instructions (Signed)
 We have successfully cleared the wax from both ears today.  If this has not resolved your hearing concerns, it is my recommendation that you follow-up with an ear nose and throat specialist/audiologist for hearing testing.

## 2024-06-20 ENCOUNTER — Other Ambulatory Visit: Payer: Self-pay

## 2024-06-20 NOTE — Transitions of Care (Post Inpatient/ED Visit) (Signed)
 Transition of Care week 2  Visit Note  06/20/2024  Name: Kristine [name] MRN: [id number]          DOB: [birth year]  Situation: Patient enrolled in [facility] 30-day program. Visit completed with patient and spouse by telephone.   Background:   Initial Transition Care Management Follow-up Telephone Call    Past Medical History:  Diagnosis Date   Chronic kidney disease    had kidney transplant   Vascular dementia without behavioral disturbance (HCC)     Assessment: Patient Reported Symptoms: Cognitive Cognitive Status: Confused or disoriented      Neurological Neurological Review of Symptoms: Hearing changes Neurological Self-Management Outcome: 3 (uncertain) Neurological Comment: Lumbar Shunt incision looking good per spouse no signs of infection. Patient has some problems with hearing per spouse. Ears cleaned of wax and appointment in a couple of weeks with [facility].  HEENT HEENT Symptoms Reported: Other: HEENT Comment: Some hearing issues.  hearing test scheduled    Cardiovascular Cardiovascular Symptoms Reported: No symptoms reported    Respiratory Respiratory Symptoms Reported: No symptoms reported    Endocrine Endocrine Symptoms Reported: No symptoms reported    Gastrointestinal Gastrointestinal Symptoms Reported: Incontinence Gastrointestinal Management Strategies: Incontinence garment/pad    Genitourinary Genitourinary Symptoms Reported: Incontinence Genitourinary Management Strategies: Incontinence garment/pad  Integumentary Integumentary Symptoms Reported: Incision Additional Integumentary Details: Incision to back. No signs of infection per spouse.  Staples to be removed next week. Skin Management Strategies: Routine screening Skin Self-Management Outcome: 3 (uncertain)  Musculoskeletal Musculoskelatal Symptoms Reviewed: Unsteady gait Additional Musculoskeletal Details: PT ordered but on hold until released from physician per spouse. Musculoskeletal Management  Strategies: Routine screening      Psychosocial Psychosocial Symptoms Reported: Not assessed Additional Psychological Details: spouse completes the assessment         There were no vitals filed for this visit.  Medications Reviewed Today     Reviewed by Stevens Magwood, RN (Case Manager) on 06/20/24 at 1029  Med List Status: <None>   Medication Order Taking? Sig Documenting Provider Last Dose Status Informant  amLODipine  (NORVASC ) 2.5 MG tablet [id number]  Take 1 tablet (2.5 mg total) by mouth at bedtime.  Patient not taking: Reported on 06/20/2024   Landy Barnie RAMAN, NP  Active Self  amLODipine  (NORVASC ) 5 MG tablet [id number] Yes Take 5 mg by mouth daily. [provider]  Active   Cholecalciferol (VITAMIN D) 2000 UNITS CAPS [id number] Yes Take 2,000 Units by mouth daily.  [provider]  Active Pharmacy Records, Other, Spouse/Significant Other  donepezil  (ARICEPT ) 10 MG tablet [id number] Yes Take 1 tablet (10 mg total) by mouth daily.  Patient taking differently: Take 5 mg by mouth daily.   Landy Barnie RAMAN, NP  Active   mirabegron  ER (MYRBETRIQ ) 50 MG TB24 tablet [id number] Yes Take 1 tablet (50 mg total) by mouth daily. Gerldine Lauraine BROCKS, FNP  Active   mycophenolate  (MYFORTIC ) 180 MG EC tablet [id number] Yes Take 3 tablets (540 mg total) by mouth 2 (two) times daily. Landy Barnie RAMAN, NP  Active            Med Note DOMENICK, [facility] R   Thu Jan 03, 2024  2:39 PM) Take two tablet 360mg   polyethylene glycol (MIRALAX  / GLYCOLAX ) 17 g packet [id number] Yes Take 17 g by mouth daily. [provider]  Active            Med Note DOMENICK, SHANIQUA R   Thu Jan 03, 2024  2:39 PM) PRN  Protein (PROSOURCE PO) [id number]  Take 30 mLs by mouth 3 (three) times daily.  Patient not taking: Reported on 06/20/2024   [provider]  Active   simvastatin  (ZOCOR ) 20 MG tablet [id number] Yes Take 1 tablet (20 mg total) by mouth at bedtime.  Patient taking differently: Take 40 mg  by mouth at bedtime.   Landy Barnie RAMAN, NP  Active   tacrolimus  (PROGRAF ) 0.5 MG capsule [id number] Yes Take 1 capsule (0.5 mg total) by mouth 2 (two) times daily.  Patient taking differently: Take 0.5 mg by mouth 2 (two) times daily. 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   Goal:  Over the next 30 days, the patient will not experience hospital readmission  Interventions:  Transitions of Care: Doctor Visits  - discussed the importance of doctor visits Post-op wound/incision care reviewed with patient/caregiver Reviewed Signs and symptoms of infection  Spouse provides care for patient,  incision to back area clean and dry per spouse with no signs infection.  Staples to be removed next week per spouse.  He states patient ambulating.  PT referral pending release from surgeon. Hearing testing scheduled in a couple of weeks.   Advised spouse to call physician for:  Nausea or Vomiting, Temperature is greater than 101.71F (38.1C) degrees, Dizziness, Abdominal Pain, Difficulty Breathing or Shortness of Breath, Inability to Eat, drink Fluids, or Take medications, Bleeding, swelling, or drainage from surgical incision sites, New numbness or weakness, Seizures, Altered mental status, and Bowel or bladder dysfunction to your neurosurgery team. During regular business hours, reach out to the clinic for a quicker response, at the number listed below. If you have not gotten in touch with them after two hours, and it is not life threatening, call the hospital at [phone] and ask for the neurosurgery resident on  call. If it is life threatening, please call 911 or go to your local ER.    Patient Self Care Activities:  Attend all scheduled provider appointments Call pharmacy for medication refills 3-7 days in advance of running out of medications Call provider office for new concerns or questions  Notify RN Care Manager of TOC call rescheduling needs Participate in Transition of Care Program/Attend TOC scheduled calls Take medications as prescribed    Plan:  The patient has been provided with contact information for the care management team and has been advised to call with any health related questions or concerns.         Recommendation:   Continue Current Plan of Care  Follow Up Plan:   Telephone follow-up in 1 week  Madellyn Denio J. Marlene Pfluger RN, MSN [facility], [facility] RN Care Manager Direct Dial: [phone]  Fax: [phone] Website: [email or web address]

## 2024-06-20 NOTE — Patient Instructions (Signed)
 Visit Information  Thank you for taking time to visit with me today. Please don't hesitate to contact me if I can be of assistance to you before our next scheduled telephone appointment.  Our next appointment is by telephone on 06/27/24 at 1030 am  Following is a copy of your care plan:   Goals Addressed             This Visit's Progress    VBCI Transitions of Care (TOC) Care Plan       Problems:  Recent Hospitalization for treatment of Normal Pressure Hydrocephalus Knowledge Deficit Related to Normal pressure hydrocephalus with Lumbar Peritoneal Shunt placement No PCP appointment- Encouraged to call for follow up.   Goal:  Over the next 30 days, the patient will not experience hospital readmission  Interventions:  Transitions of Care: Doctor Visits  - discussed the importance of doctor visits Post-op wound/incision care reviewed with patient/caregiver Reviewed Signs and symptoms of infection  Spouse provides care for patient,  incision to back area clean and dry per spouse with no signs infection.  Staples to be removed next week per spouse.  He states patient ambulating.  PT referral pending release from surgeon. Hearing testing scheduled in a couple of weeks.   Advised spouse to call physician for:  Nausea or Vomiting, Temperature is greater than 101.35F (38.1C) degrees, Dizziness, Abdominal Pain, Difficulty Breathing or Shortness of Breath, Inability to Eat, drink Fluids, or Take medications, Bleeding, swelling, or drainage from surgical incision sites, New numbness or weakness, Seizures, Altered mental status, and Bowel or bladder dysfunction to your neurosurgery team. During regular business hours, reach out to the clinic for a quicker response, at the number listed below. If you have not gotten in touch with them after two hours, and it is not life threatening, call the hospital at [phone] and ask for the neurosurgery resident on call. If it is life threatening, please  call 911 or go to your local ER.    Patient Self Care Activities:  Attend all scheduled provider appointments Call pharmacy for medication refills 3-7 days in advance of running out of medications Call provider office for new concerns or questions  Notify RN Care Manager of TOC call rescheduling needs Participate in Transition of Care Program/Attend TOC scheduled calls Take medications as prescribed    Plan:  The patient has been provided with contact information for the care management team and has been advised to call with any health related questions or concerns.         The patient verbalized understanding of instructions, educational materials, and care plan provided today and DECLINED offer to receive copy of patient instructions, educational materials, and care plan.   The patient has been provided with contact information for the care management team and has been advised to call with any health related questions or concerns.   Please call the care guide team at [phone] if you need to cancel or reschedule your appointment.   Please call the Suicide and Crisis Lifeline: 988 if you are experiencing a Mental Health or Behavioral Health Crisis or need someone to talk to.  Ernesto Lashway J. Avo Schlachter RN, MSN [facility] RN Care Manager Direct Dial: [phone]  Fax: [phone] Website: [email or web address]

## 2024-06-23 NOTE — ED Provider Notes (Signed)
 [facility]    CSN: [id number] Arrival date & time: 06/18/24  1525      History   Chief Complaint No chief complaint on file.   HPI Kristine [name] is a 83 y.o. female.   Patient presenting today with at least a week of decreased hearing bilaterally.  Per her husband, this has been going on since prior to her recent surgery last week but patient states it seems worse since her shunt surgery last week.  She denies associated headache, ringing in the ears, nausea, vomiting, dizziness.  States that the surgeons office told her to come to an urgent care to have her ears checked.  So far not trying anything over-the-counter for symptoms.    Past Medical History:  Diagnosis Date   Chronic kidney disease    had kidney transplant   Vascular dementia without behavioral disturbance [facility])     Patient Active Problem List   Diagnosis Date Noted   Urge incontinence 11/06/2023   Nocturia 11/06/2023   Recurrent UTI 11/06/2023   Chronic constipation 07/20/2023   Aortic atherosclerosis (HCC) 07/20/2023   Major depression, recurrent, chronic (HCC) 07/20/2023   Severe protein-calorie malnutrition (HCC) 07/20/2023   OAB (overactive bladder) 07/20/2023   Subdural hematoma (HCC) 07/10/2023   Hyperlipidemia LDL goal <100 07/10/2023   Fall at home 07/10/2023   Hyperglycemia 07/10/2023   Pressure injury of skin 07/28/2022   HTN (hypertension) 07/27/2022   Vascular dementia (HCC) 10/30/2021   Loss of weight 09/05/2021   Vaginal atrophy 03/22/2021   History of kidney transplant 03/22/2021   Endometrial hyperplasia without atypia 01/19/2021   PMB (postmenopausal bleeding)    Cervical stenosis (uterine cervix)    Immunosuppressed status (HCC) 10/12/2015   Tubular adenoma 07/22/2012   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    Past Surgical History:  Procedure Laterality Date   BRAIN SURGERY  2024   CHOLECYSTECTOMY     COLONOSCOPY  08/28/2012   Procedure: COLONOSCOPY;  Surgeon:  Claudis RAYMOND Rivet, MD;  [facility];  Service: Endoscopy;  Laterality: N/A;  930   COLONOSCOPY WITH PROPOFOL  N/A 07/14/2020   Procedure: COLONOSCOPY WITH PROPOFOL ;  Surgeon: Rivet Claudis RAYMOND, MD;  Location: [facility];  Service: Endoscopy;  Laterality: N/A;  930   HYSTEROSCOPY WITH D & C N/A 01/11/2021   Procedure: DILATATION AND CURETTAGE /HYSTEROSCOPY;  Surgeon: Lake Read, MD;  Location: [facility];  Service: Gynecology;  Laterality: N/A;   KIDNEY TRANSPLANT     LUMBAR PERITONEAL SHUNT     orif left wrist     TONSILLECTOMY      OB History     Gravida  2   Para  2   Term  2   Preterm      AB      Living  2      SAB      IAB      Ectopic      Multiple      Live Births  2            Home Medications    Prior to Admission medications   Medication Sig Start Date End Date Taking? Authorizing Provider  amLODipine  (NORVASC ) 2.5 MG tablet Take 1 tablet (2.5 mg total) by mouth at bedtime. Patient not taking: Reported on 06/20/2024 08/01/23   Landy Barnie RAMAN, NP  amLODipine  (NORVASC ) 5 MG tablet Take 5 mg by mouth daily.    [provider]  Cholecalciferol (VITAMIN D) 2000 UNITS  CAPS Take 2,000 Units by mouth daily.     [provider]  donepezil  (ARICEPT ) 10 MG tablet Take 1 tablet (10 mg total) by mouth daily. Patient taking differently: Take 5 mg by mouth daily. 08/01/23   Landy Barnie RAMAN, NP  mirabegron  ER (MYRBETRIQ ) 50 MG TB24 tablet Take 1 tablet (50 mg total) by mouth daily. 12/21/23   Gerldine Lauraine BROCKS, FNP  mycophenolate  (MYFORTIC ) 180 MG EC tablet Take 3 tablets (540 mg total) by mouth 2 (two) times daily. 08/01/23   Landy Barnie RAMAN, NP  polyethylene glycol (MIRALAX  / GLYCOLAX ) 17 g packet Take 17 g by mouth daily. Patient taking differently: Take 17 g by mouth every other day.    [provider]  Protein (PROSOURCE PO) Take 30 mLs by mouth 3 (three) times daily. Patient not taking: Reported on 06/20/2024     [provider]  simvastatin  (ZOCOR ) 20 MG tablet Take 1 tablet (20 mg total) by mouth at bedtime. Patient taking differently: Take 40 mg by mouth at bedtime. 08/01/23   Landy Barnie RAMAN, NP  tacrolimus  (PROGRAF ) 0.5 MG capsule Take 1 capsule (0.5 mg total) by mouth 2 (two) times daily. Patient taking differently: Take 0.5 mg by mouth 2 (two) times daily. 2 capsules in the morning and one in the evening 08/01/23   Landy Barnie RAMAN, NP    Family History Family History  Problem Relation Age of Onset   Stroke Brother     Social History Social History   Tobacco Use   Smoking status: Former   Smokeless tobacco: Never   Tobacco comments:    quit in 1977  Vaping Use   Vaping status: Never Used  Substance Use Topics   Alcohol use: No   Drug use: No     Allergies   Patient has no known allergies.   Review of Systems Review of Systems Per HPI  Physical Exam Triage Vital Signs ED Triage Vitals  Encounter Vitals Group     BP 06/18/24 1531 (!) 149/84     Girls Systolic BP Percentile --      Girls Diastolic BP Percentile --      Boys Systolic BP Percentile --      Boys Diastolic BP Percentile --      Pulse Rate 06/18/24 1531 60     Resp 06/18/24 1531 18     Temp 06/18/24 1531 98 F (36.7 C)     Temp Source 06/18/24 1531 Oral     SpO2 06/18/24 1531 96 %     Weight --      Height --      Head Circumference --      Peak Flow --      Pain Score 06/18/24 1533 0     Pain Loc --      Pain Education --      Exclude from Growth Chart --    No data found.  Updated Vital Signs BP (!) 149/84 (BP Location: Left Arm)   Pulse 60   Temp 98 F (36.7 C) (Oral)   Resp 18   SpO2 96%   Visual Acuity Right Eye Distance:   Left Eye Distance:   Bilateral Distance:    Right Eye Near:   Left Eye Near:    Bilateral Near:     Physical Exam Vitals and nursing note reviewed.  Constitutional:      Appearance: Normal appearance. She is not ill-appearing.  HENT:     Head:  Atraumatic.     Right Ear: There is impacted cerumen.     Left Ear: There is impacted cerumen.     Nose: Nose normal.     Mouth/Throat:     Mouth: Mucous membranes are moist.  Eyes:     Extraocular Movements: Extraocular movements intact.     Conjunctiva/sclera: Conjunctivae normal.  Cardiovascular:     Rate and Rhythm: Normal rate.  Pulmonary:     Effort: Pulmonary effort is normal.  Musculoskeletal:        General: Normal range of motion.     Cervical back: Normal range of motion and neck supple.  Skin:    General: Skin is warm and dry.  Neurological:     Mental Status: She is alert and oriented to person, place, and time.  Psychiatric:        Mood and Affect: Mood normal.        Thought Content: Thought content normal.        Judgment: Judgment normal.      UC Treatments / Results  Labs (all labs ordered are listed, but only abnormal results are displayed) Labs Reviewed - No data to display  EKG   Radiology No results found.  Procedures Procedures (including critical care time)  Medications Ordered in UC Medications - No data to display  Initial Impression / Assessment and Plan / UC Course  I have reviewed the triage vital signs and the nursing notes.  Pertinent labs & imaging results that were available during my care of the patient were reviewed by me and considered in my medical decision making (see chart for details).     Lavage was performed with warm water  and peroxide bilaterally with good clearance of wax impactions.  Patient reports symptomatic benefit and tolerated procedure well.  TMs visualized and benign postprocedure.  Discussed following up with an audiologist if still having hearing changes or concerns.  Return for worsening symptoms.  Final Clinical Impressions(s) / UC Diagnoses   Final diagnoses:  Bilateral impacted cerumen  Bilateral hearing loss, unspecified hearing loss type     Discharge Instructions      We have successfully  cleared the wax from both ears today.  If this has not resolved your hearing concerns, it is my recommendation that you follow-up with an ear nose and throat specialist/audiologist for hearing testing.    ED Prescriptions   None    PDMP not reviewed this encounter.   Stuart Vernell Norris, PA-C 06/23/24 1448

## 2024-06-26 DIAGNOSIS — R030 Elevated blood-pressure reading, without diagnosis of hypertension: Secondary | ICD-10-CM | POA: Diagnosis not present

## 2024-06-26 DIAGNOSIS — Z4802 Encounter for removal of sutures: Secondary | ICD-10-CM | POA: Diagnosis not present

## 2024-06-27 ENCOUNTER — Other Ambulatory Visit: Payer: Self-pay

## 2024-06-27 NOTE — Patient Instructions (Signed)
 Visit Information  Thank you for taking time to visit with me today. Please don't hesitate to contact me if I can be of assistance to you before our next scheduled telephone appointment.  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   Goal:  Over the next 30 days, the patient will not experience hospital readmission  Interventions:  Transitions of Care: Doctor Visits  - discussed the importance of doctor visits Post-op wound/incision care reviewed with patient/caregiver Reviewed Signs and symptoms of infection  Spouse provides care for patient,  incision to back area clean and dry per spouse with no signs infection.  Staples to be removed at Urgent care yesterday.  He states patient ambulating better.  Rehab possible after 9/20.   Reiterated with spouse to call physician for:  Nausea or Vomiting, Temperature is greater than 101.57F (38.1C) degrees, Dizziness, Abdominal Pain, Difficulty Breathing or Shortness of Breath, Inability to Eat, drink Fluids, or Take medications, Bleeding, swelling, or drainage from surgical incision sites, New numbness or weakness, Seizures, Altered mental status, and Bowel or bladder dysfunction to your neurosurgery team. During regular business hours, reach out to the clinic for a quicker response, at the number listed below. If you have not gotten in touch with them after two hours, and it is not life threatening, call the hospital at [phone] and ask for the neurosurgery resident on call. If it is life threatening, please call 911 or go to your local ER.     Patient Self Care Activities:  Attend all scheduled provider appointments Call pharmacy for medication refills 3-7 days in advance of running out of medications Call provider office for new concerns or questions  Notify RN Care Manager of TOC call rescheduling needs Participate in Transition of Care Program/Attend TOC scheduled calls Take medications as prescribed    Plan:  The patient has been provided with contact information for the care management team and has been advised to call with any health related questions or concerns.         The patient verbalized understanding of instructions, educational materials, and care plan provided today and DECLINED offer to receive copy of patient instructions, educational materials, and care plan.   The patient has been provided with contact information for the care management team and has been advised to call with any health related questions or concerns.   Please call the care guide team at [phone] if you need to cancel or reschedule your appointment.   Please call the Suicide and Crisis Lifeline: 988 if you are experiencing a Mental Health or Behavioral Health Crisis or need someone to talk to.  Rayli Wiederhold J. Lendon George RN, MSN [facility], [facility] RN Care Manager Direct Dial: [phone]  Fax: [phone] Website: [email or web address]

## 2024-06-27 NOTE — Transitions of Care (Post Inpatient/ED Visit) (Signed)
 Transition of Care week 3  Visit Note  06/27/2024  Name: Kristine [name] MRN: [id number]          DOB: 1941/06/24  Situation: Patient enrolled in [facility] 30-day program. Visit completed with patient and spouse by telephone.   Background:   Initial Transition Care Management Follow-up Telephone Call    Past Medical History:  Diagnosis Date   Chronic kidney disease    had kidney transplant   Vascular dementia without behavioral disturbance (HCC)     Assessment: Patient Reported Symptoms: Cognitive Cognitive Status: Alert and oriented to person, place, and time (Patient has some confusion at times per spouse)      Neurological Neurological Review of Symptoms: Hearing changes Neurological Management Strategies: Routine screening Neurological Self-Management Outcome: 3 (uncertain) Neurological Comment: Assessment pending with [facility]  HEENT HEENT Symptoms Reported: Other: HEENT Comment: Hearing issues per spouse.  Heart test scheduled.    Cardiovascular Cardiovascular Symptoms Reported: No symptoms reported    Respiratory Respiratory Symptoms Reported: No symptoms reported    Endocrine Endocrine Symptoms Reported: No symptoms reported    Gastrointestinal Gastrointestinal Symptoms Reported: Incontinence Gastrointestinal Management Strategies: Incontinence garment/pad Gastrointestinal Self-Management Outcome: 4 (good)    Genitourinary Genitourinary Symptoms Reported: Incontinence Genitourinary Management Strategies: Incontinence garment/pad  Integumentary Integumentary Symptoms Reported: Incision Additional Integumentary Details: Incision to back.  Spouse reports that incision Skin Management Strategies: Routine screening Skin Self-Management Outcome: 4 (good)  Musculoskeletal Musculoskelatal Symptoms Reviewed: Unsteady gait Additional Musculoskeletal Details: Spouse states patient ambulating much better.  Possible rehab after 9-20. Musculoskeletal Management Strategies:  Routine screening Musculoskeletal Self-Management Outcome: 3 (uncertain) Falls in the past year?: No    Psychosocial Psychosocial Symptoms Reported: No symptoms reported         There were no vitals filed for this visit.  Medications Reviewed Today     Reviewed by Tristian Bouska, RN (Case Manager) on 06/27/24 at 1057  Med List Status: <None>   Medication Order Taking? Sig Documenting Provider Last Dose Status Informant  amLODipine  (NORVASC ) 2.5 MG tablet [id number]  Take 1 tablet (2.5 mg total) by mouth at bedtime.  Patient not taking: Reported on 06/27/2024   Landy Barnie RAMAN, NP  Active Self  amLODipine  (NORVASC ) 5 MG tablet [id number] Yes Take 5 mg by mouth daily. [provider]  Active   Cholecalciferol (VITAMIN D) 2000 UNITS CAPS [id number] Yes Take 2,000 Units by mouth daily.  [provider]  Active Pharmacy Records, Other, Spouse/Significant Other  donepezil  (ARICEPT ) 10 MG tablet [id number] Yes Take 1 tablet (10 mg total) by mouth daily.  Patient taking differently: Take 5 mg by mouth daily.   Landy Barnie RAMAN, NP  Active   mirabegron  ER (MYRBETRIQ ) 50 MG TB24 tablet [id number] Yes Take 1 tablet (50 mg total) by mouth daily. Gerldine Lauraine BROCKS, FNP  Active   mycophenolate  (MYFORTIC ) 180 MG EC tablet [id number] Yes Take 3 tablets (540 mg total) by mouth 2 (two) times daily. Landy Barnie RAMAN, NP  Active            Med Note DOMENICK, [facility] R   Thu Jan 03, 2024  2:39 PM) Take two tablet 360mg   polyethylene glycol (MIRALAX  / GLYCOLAX ) 17 g packet [id number] Yes Take 17 g by mouth daily.  Patient taking differently: Take 17 g by mouth every other day.   [provider]  Active            Med Note DOMENICK, [facility] R   Thu Jan 03, 2024  2:39 PM) PRN  Protein (PROSOURCE PO) [id number]  Take 30 mLs by mouth 3 (three) times daily.  Patient not taking: Reported on 06/27/2024   [provider]  Active   simvastatin  (ZOCOR ) 20 MG tablet [id number] Yes Take 1 tablet  (20 mg total) by mouth at bedtime.  Patient taking differently: Take 40 mg by mouth at bedtime.   Landy Barnie RAMAN, NP  Active   tacrolimus  (PROGRAF ) 0.5 MG capsule [id number] Yes Take 1 capsule (0.5 mg total) by mouth 2 (two) times daily.  Patient taking differently: Take 0.5 mg by mouth 2 (two) times daily. 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   Goal:  Over the next 30 days, the patient will not experience hospital readmission  Interventions:  Transitions of Care: Doctor Visits  - discussed the importance of doctor visits Post-op wound/incision care reviewed with patient/caregiver Reviewed Signs and symptoms of infection  Spouse provides care for patient,  incision to back area clean and dry per spouse with no signs infection.  Staples to be removed at Urgent care yesterday.  He states patient ambulating better.  Rehab possible after 9/20.   Reiterated with spouse to call physician for:  Nausea or Vomiting, Temperature is greater than 101.29F (38.1C) degrees, Dizziness, Abdominal Pain, Difficulty Breathing or Shortness of Breath, Inability to Eat, drink Fluids, or Take medications, Bleeding, swelling, or drainage from surgical incision sites, New numbness or weakness, Seizures, Altered mental status, and Bowel or bladder dysfunction to your neurosurgery team. During regular business hours, reach out to the clinic for a quicker response, at the number listed below. If you have not gotten in touch with them after two hours, and it is not life threatening, call the hospital at [phone] and ask  for the neurosurgery resident on call. If it is life threatening, please call 911 or go to your local ER.    Patient Self Care Activities:  Attend all scheduled provider appointments Call pharmacy for medication refills 3-7 days in advance of running out of medications Call provider office for new concerns or questions  Notify RN Care Manager of TOC call rescheduling needs Participate in Transition of Care Program/Attend TOC scheduled calls Take medications as prescribed    Plan:  The patient has been provided with contact information for the care management team and has been advised to call with any health related questions or concerns.         Recommendation:   Continue Current Plan of Care  Follow Up Plan:   Telephone follow-up in 1 week  Arshawn Valdez J. Pier Bosher RN, MSN [facility], [facility] RN Care Manager Direct Dial: [phone]  Fax: [phone] Website: [email or web address]

## 2024-07-03 DIAGNOSIS — Z940 Kidney transplant status: Secondary | ICD-10-CM | POA: Diagnosis not present

## 2024-07-04 ENCOUNTER — Telehealth: Payer: Self-pay

## 2024-07-08 DIAGNOSIS — I739 Peripheral vascular disease, unspecified: Secondary | ICD-10-CM | POA: Diagnosis not present

## 2024-07-08 DIAGNOSIS — B351 Tinea unguium: Secondary | ICD-10-CM | POA: Diagnosis not present

## 2024-07-22 DIAGNOSIS — H04123 Dry eye syndrome of bilateral lacrimal glands: Secondary | ICD-10-CM | POA: Diagnosis not present

## 2024-07-25 DIAGNOSIS — G912 (Idiopathic) normal pressure hydrocephalus: Secondary | ICD-10-CM | POA: Diagnosis not present

## 2024-07-25 DIAGNOSIS — R2689 Other abnormalities of gait and mobility: Secondary | ICD-10-CM | POA: Diagnosis not present

## 2024-08-05 DIAGNOSIS — Z940 Kidney transplant status: Secondary | ICD-10-CM | POA: Diagnosis not present

## 2024-08-28 ENCOUNTER — Encounter: Payer: Self-pay | Admitting: Obstetrics

## 2024-08-28 ENCOUNTER — Ambulatory Visit: Payer: Self-pay | Admitting: Obstetrics

## 2024-08-28 ENCOUNTER — Ambulatory Visit: Admitting: Obstetrics

## 2024-08-28 VITALS — BP 128/77 | HR 54

## 2024-08-28 DIAGNOSIS — N393 Stress incontinence (female) (male): Secondary | ICD-10-CM

## 2024-08-28 DIAGNOSIS — R351 Nocturia: Secondary | ICD-10-CM

## 2024-08-28 DIAGNOSIS — R350 Frequency of micturition: Secondary | ICD-10-CM

## 2024-08-28 LAB — POCT URINALYSIS DIP (CLINITEK)
Bilirubin, UA: NEGATIVE
Glucose, UA: NEGATIVE mg/dL
Ketones, POC UA: NEGATIVE mg/dL
Leukocytes, UA: NEGATIVE
Nitrite, UA: NEGATIVE
POC PROTEIN,UA: NEGATIVE
Spec Grav, UA: 1.025 (ref 1.010–1.025)
Urobilinogen, UA: 1 U/dL
pH, UA: 6 (ref 5.0–8.0)

## 2024-08-28 MED ORDER — TROSPIUM CHLORIDE ER 60 MG PO CP24: 1.0000 | ORAL_CAPSULE | Freq: Every day | ORAL | 2 refills | Status: DC

## 2024-08-28 MED ORDER — CIPROFLOXACIN HCL 500 MG PO TABS: 500.0000 mg | ORAL_TABLET | Freq: Once | ORAL | Status: AC

## 2024-08-28 NOTE — Progress Notes (Signed)
 Bulkamid Injection  CC: 83 y.o. y.o. 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  Patient signed her consent form.  She started antibiotic prophylaxis with Cipro 500mg  today.  Positive CST 04/07/24 Failed mirabegron  50mg , Vesicare  10mg , and Gemtesa . Tried timed voids and avoiding caffeine in the past S/p shunt placement for NPH in 06/11/24 by Dr. Oretha at [facility] with no change in pad use or urinary leakage.  Husband reports most bothersome leakage when she is asleep.  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 Watt, last seen by urology 02/14/24  Today's Vitals   08/28/24 1150  BP: 128/77  Pulse: (!) 54    Results for orders placed or performed in visit on 08/28/24 (from the past 24 hours)  POCT URINALYSIS DIP (CLINITEK)     Status: Abnormal   Collection Time: 08/28/24 11:48 AM  Result Value Ref Range   Color, UA yellow yellow   Clarity, UA clear clear   Glucose, UA negative negative mg/dL   Bilirubin, UA negative negative   Ketones, POC UA negative negative mg/dL   Spec Grav, UA 8.974 8.989 - 1.025   Blood, UA trace-intact (A) negative   pH, UA 6.0 5.0 - 8.0   POC PROTEIN,UA negative negative, trace   Urobilinogen, UA 1.0 0.2 or 1.0 E.U./dL   Nitrite, UA Negative Negative   Leukocytes, UA Negative Negative    Procedure: Time out was performed. The bladder was catheterized and 10 ml of 2% lidocaine  jelly placed in the urethra. A urethral block was performed by injecting 3ml of 1% lidocaine  with epinephrine at 3 and 9 o'clock adjacent to the urethra.  The needle was primed.  The cystoscope was inserted to the level of the bladder neck.  The needle was inserted 2 cm and the scope was pulled back into the urethra 2 cm.  The  needle was inserted bevel up at the 5 o'clock position and the Bulkamid was injected to obtain coaptation.  This was repeated at the 2 o'clock,  10 o'clock and 7 o'clock positions.   A total of 2- 1ml syringes were used and good circumferential coaptation was noted.  The patient tolerated the procedure well. She was asked to void after the procedure.  Patient reports large volume leakage with urgency post procedure with PVR  Post-Void Residual (PVR) by Bladder Scan: In order to evaluate bladder emptying, we discussed obtaining a postvoid residual and she agreed to this procedure.  Procedure: The ultrasound unit was placed on the patient's abdomen in the suprapubic region after the patient had voided. A PVR of 225 ml was obtained by bladder scan after DOI. Repeat  PVR after urinating. Patient declined foley placement and desires to return after a few hours. Returned for repeat PVR and 67mL   ASSESSMENT: 83 y.o. y.o. s/p transurethral Bulkamid injection for stress incontinence.   PLAN: Patient will follow up in 4 weeks to reassess. Voiding and post-procedure precautions were given. She will return for heavy bleeding, fevers, dysuria lasting beyond today and incomplete emptying. - reviewed treatment options for OAB vs. SUI with patient and her husband. Reviewed urethral bulking (Bulkamid) for stress urinary leakage due to positive CST. We discussed success rate of approximately 70-80% and possible need for second injection.  We reviewed that this is  not a permanent procedure and the Bulkamid does become less effective over time. Risks reviewed including injury to bladder or urethra, UTI, urinary retention and hematuria.  - reviewed treatment options for OAB, continue Mirabegron  50mg  and add Trospium 60mg  due to large volume leakage during sleep - referral sent for sleep study in [place] to r/o sleep apnea  All questions were answered.  Lianne ONEIDA Gillis, MD

## 2024-08-28 NOTE — Patient Instructions (Addendum)
 Taking Care of Yourself after Urodynamics, Cystoscopy, Bulkamid Injection, or Botox Injection   Drink plenty of water  for a day or two following your procedure. Try to have about 8 ounces (one cup) at a time, and do this 6 times or more per day unless you have fluid restrictitons AVOID irritative beverages such as coffee, tea, soda, alcoholic or citrus drinks for a day or two, as this may cause burning with urination.  For the first 1-2 days after the procedure, your urine may be pink or red in color. You may have some blood in your urine as a normal side effect of the procedure. Large amounts of bleeding or difficulty urinating are NOT normal. Call the nurse line if this happens or go to the nearest Emergency Room if the bleeding is heavy or you cannot urinate at all and it is after hours. If you had a Bulkamid injection in the urethra and need to be catheterized, ask for a pediatric catheter to be used (size 10 or 12-French) so the material is not pushed out of place.   You may experience some discomfort or a burning sensation with urination after having this procedure. You can use over the counter Azo or pyridium to help with burning and follow the instructions on the packaging. If it does not improve within 1-2 days, or other symptoms appear (fever, chills, or difficulty urinating) call the office to speak to a nurse.  You may return to normal daily activities such as work, school, driving, exercising and housework on the day of the procedure. If your doctor gave you a prescription, take it as ordered.   Continue mirabegron  50mg  daily.   Add Trospium 60mg  daily.   For anticholinergic medications, we discussed the potential side effects of anticholinergics including dry eyes, dry mouth, constipation, rare risks of cognitive impairment and urinary retention. You were given prescription for Trospium.  It can take a month to start working so give it time, but if you have bothersome side effects call  sooner and we can try a different medication.  Call us  if you have trouble filling the prescription or if it's not covered by your insurance.  Follow-up in 4  to see how your symptoms are responding to treatment.    [facility] [phone] [facility] [address] [place], [place] [address]

## 2024-08-28 NOTE — Progress Notes (Signed)
 Patients PVR was 225 PVR-193 after urinating Second PVR is 115  Third PVR was 21 Patient called at 3:35pm , her husband called and states he is doing well. No trouble urinating. Will call tomorrow with update.

## 2024-08-29 ENCOUNTER — Ambulatory Visit

## 2024-08-29 VITALS — BP 122/76 | HR 62

## 2024-08-29 DIAGNOSIS — R300 Dysuria: Secondary | ICD-10-CM

## 2024-08-29 DIAGNOSIS — R319 Hematuria, unspecified: Secondary | ICD-10-CM

## 2024-08-29 LAB — POCT URINALYSIS DIP (CLINITEK)
Bilirubin, UA: NEGATIVE
Blood, UA: NEGATIVE
Glucose, UA: NEGATIVE mg/dL
Ketones, POC UA: NEGATIVE mg/dL
Leukocytes, UA: NEGATIVE
Nitrite, UA: NEGATIVE
POC PROTEIN,UA: NEGATIVE
Spec Grav, UA: 1.02 (ref 1.010–1.025)
Urobilinogen, UA: 0.2 U/dL
pH, UA: 6.5 (ref 5.0–8.0)

## 2024-08-29 NOTE — Progress Notes (Signed)
 Patient came to the office for a PVR today after her urethral bulking on 08-28-2024. PVR was 62 Per Dr. Guadlupe do a in and out catheter. Bladder was drained of 75cc. Urine Clintex was performed and was negative for a UTI.

## 2024-09-01 ENCOUNTER — Ambulatory Visit

## 2024-09-04 DIAGNOSIS — N3941 Urge incontinence: Secondary | ICD-10-CM | POA: Diagnosis not present

## 2024-09-04 DIAGNOSIS — I10 Essential (primary) hypertension: Secondary | ICD-10-CM | POA: Diagnosis not present

## 2024-09-04 DIAGNOSIS — G912 (Idiopathic) normal pressure hydrocephalus: Secondary | ICD-10-CM | POA: Diagnosis not present

## 2024-09-12 DIAGNOSIS — G912 (Idiopathic) normal pressure hydrocephalus: Secondary | ICD-10-CM | POA: Diagnosis not present

## 2024-09-12 DIAGNOSIS — R2689 Other abnormalities of gait and mobility: Secondary | ICD-10-CM | POA: Diagnosis not present

## 2024-09-25 DIAGNOSIS — B351 Tinea unguium: Secondary | ICD-10-CM | POA: Diagnosis not present

## 2024-09-25 DIAGNOSIS — I739 Peripheral vascular disease, unspecified: Secondary | ICD-10-CM | POA: Diagnosis not present

## 2024-10-01 DIAGNOSIS — Z23 Encounter for immunization: Secondary | ICD-10-CM | POA: Diagnosis not present

## 2024-10-14 ENCOUNTER — Ambulatory Visit (INDEPENDENT_AMBULATORY_CARE_PROVIDER_SITE_OTHER): Admitting: Obstetrics and Gynecology

## 2024-10-14 ENCOUNTER — Encounter: Payer: Self-pay | Admitting: Obstetrics and Gynecology

## 2024-10-14 VITALS — BP 124/84 | HR 59

## 2024-10-14 DIAGNOSIS — R350 Frequency of micturition: Secondary | ICD-10-CM

## 2024-10-14 DIAGNOSIS — R351 Nocturia: Secondary | ICD-10-CM

## 2024-10-14 MED ORDER — VIBEGRON 75 MG PO TABS: 75.0000 mg | ORAL_TABLET | Freq: Every day | ORAL | 5 refills | Status: DC

## 2024-10-14 NOTE — Progress Notes (Signed)
 [facility] Return Visit  SUBJECTIVE  History of Present Illness: CATRICE Roberto Romanoski is a 83 y.o. female seen in follow-up for urethral bulking and OAB. Plan at last visit was urethral bulking was done and then patient was to start dual therapy with the Myrbetriq  and Trospium . Patient denies improvement with the Trospium .    Patient's husband reports there may have been a small amount of improvement in her leakage. He states she goes to bed at 8pm and he has to get her up at midnight to change/urinate or there is a mess. Once she gets up that one time she is able to sleep the rest of the night. She denies any leakage during the day.   Patient is asking if there is anything else that can be done for support   Past Medical History: Patient  has a past medical history of Chronic kidney disease and Vascular dementia without behavioral disturbance (HCC).   Past Surgical History: She  has a past surgical history that includes Kidney transplant; Tonsillectomy; Cholecystectomy; orif left wrist; Colonoscopy (08/28/2012); Colonoscopy with propofol  (N/A, 07/14/2020); Hysteroscopy with D & C (N/A, 01/11/2021); Brain surgery (2024); and Lumbar peritoneal shunt.   Medications: She has a current medication list which includes the following prescription(s): amlodipine , amlodipine , vitamin d, donepezil , mycophenolate , polyethylene glycol, protein, simvastatin , tacrolimus , and vibegron , and the following Facility-Administered Medications: ciprofloxacin .   Allergies: Patient has no known allergies.   Social History: Patient  reports that she has quit smoking. She has never used smokeless tobacco. She reports that she does not drink alcohol and does not use drugs.     OBJECTIVE     Physical Exam: Vitals:   10/14/24 1258  BP: 124/84  Pulse: (!) 59   Gen: No apparent distress, A&O x 3.  Detailed Urogynecologic Evaluation:  Deferred.   ASSESSMENT AND PLAN    Ms. Raftery is a 82 y.o.  with:  1. Nocturia   2. Urinary frequency    Patient to stop Myrbetriq  50mg  and start trial of Gemtesa  75mg  daily. 4 boxes of samples given today to start as it is the end of the year and if PA needed, we will do it in January.  Patient's husband reports he is hopeful this will help her sleep through the night. She has no leakage while awake. Patient is not a good candidate for surgical intervention such as SNM and they live far away to do PTNS. I do not think she would be a great candidate for botox either, as she would need assistance to self cath if needed.   Patient to follow up for med check in 4-6 weeks, can be a virtual visit.    Brazos Sandoval G Shene Maxfield, NP

## 2024-10-14 NOTE — Patient Instructions (Signed)
 Start Gemtesa  75mg  tonight

## 2024-11-20 ENCOUNTER — Telehealth: Payer: Self-pay

## 2024-11-20 MED ORDER — MIRABEGRON ER 50 MG PO TB24: 50.0000 mg | ORAL_TABLET | Freq: Every day | ORAL | 0 refills | Status: AC

## 2024-11-20 NOTE — Addendum Note (Signed)
 Addended by: Alveria Mcglaughlin N on: 11/20/2024 04:34 PM   Modules accepted: Orders

## 2024-11-20 NOTE — Telephone Encounter (Signed)
 Patient husband called stating Kristine [name] needs a refill on her Myrbetriq  due to Gemtesa  being expensive. I've sent in medication to patients pharmacy.

## 2024-11-23 ENCOUNTER — Encounter (HOSPITAL_BASED_OUTPATIENT_CLINIC_OR_DEPARTMENT_OTHER): Payer: Self-pay

## 2024-11-24 ENCOUNTER — Telehealth: Admitting: Obstetrics and Gynecology

## 2024-11-25 ENCOUNTER — Telehealth: Admitting: Obstetrics and Gynecology

## 2024-11-25 ENCOUNTER — Telehealth: Admitting: Obstetrics

## 2024-11-25 DIAGNOSIS — R351 Nocturia: Secondary | ICD-10-CM

## 2024-11-25 DIAGNOSIS — N393 Stress incontinence (female) (male): Secondary | ICD-10-CM | POA: Insufficient documentation

## 2024-11-25 DIAGNOSIS — N3941 Urge incontinence: Secondary | ICD-10-CM

## 2024-11-25 MED ORDER — GEMTESA 75 MG PO TABS: 75.0000 mg | ORAL_TABLET | Freq: Every day | ORAL | 2 refills | Status: AC

## 2024-11-25 NOTE — Assessment & Plan Note (Addendum)
-   Positive CST 04/07/24, s/p bulkamid urethral bulking 08/28/24 - reports 75% reduction of urinary symptoms - For treatment of stress urinary incontinence,  non-surgical options include expectant management, weight loss, physical therapy, as well as a pessary.  Surgical options include a midurethral sling, Burch urethropexy, and transurethral injection of a bulking agent. - We discussed success rate of approximately 70-80% and possible need for second injection. We reviewed that this is not a permanent procedure and the Bulkamid does become less effective over time. Risks reviewed including injury to bladder or urethra, UTI, urinary retention and hematuria. Consider repeat if symptoms return

## 2024-11-26 ENCOUNTER — Telehealth: Payer: Self-pay

## 2024-11-26 NOTE — Telephone Encounter (Signed)
 SABRA

## 2025-01-23 ENCOUNTER — Ambulatory Visit: Admitting: Obstetrics
